# Patient Record
Sex: Female | Born: 1980 | Race: White | Hispanic: Yes | Smoking: Never smoker
Health system: Southern US, Community
[De-identification: ages and names within clinical notes are randomized; demographics above are authoritative.]

## PROBLEM LIST (undated history)

## (undated) DIAGNOSIS — Z789 Other specified health status: Secondary | ICD-10-CM

## (undated) DIAGNOSIS — O24419 Gestational diabetes mellitus in pregnancy, unspecified control: Secondary | ICD-10-CM

## (undated) HISTORY — DX: Gestational diabetes mellitus in pregnancy, unspecified control: O24.419

## (undated) HISTORY — PX: APPENDECTOMY: SHX54

---

## 2006-10-17 ENCOUNTER — Ambulatory Visit (HOSPITAL_COMMUNITY): Admission: RE | Admit: 2006-10-17 | Discharge: 2006-10-17 | Payer: Self-pay | Admitting: Family Medicine

## 2007-01-30 ENCOUNTER — Inpatient Hospital Stay (HOSPITAL_COMMUNITY): Admission: AD | Admit: 2007-01-30 | Discharge: 2007-01-31 | Payer: Self-pay | Admitting: Family Medicine

## 2007-01-30 ENCOUNTER — Ambulatory Visit: Payer: Self-pay | Admitting: Obstetrics and Gynecology

## 2009-02-08 ENCOUNTER — Emergency Department (HOSPITAL_COMMUNITY): Admission: EM | Admit: 2009-02-08 | Discharge: 2009-02-08 | Payer: Self-pay | Admitting: Emergency Medicine

## 2010-07-12 LAB — URINALYSIS, ROUTINE W REFLEX MICROSCOPIC
Bilirubin Urine: NEGATIVE
Glucose, UA: NEGATIVE mg/dL
Ketones, ur: NEGATIVE mg/dL
Protein, ur: NEGATIVE mg/dL
Specific Gravity, Urine: 1.023 (ref 1.005–1.030)
Urobilinogen, UA: 1 mg/dL (ref 0.0–1.0)

## 2010-07-12 LAB — WET PREP, GENITAL: Yeast Wet Prep HPF POC: NONE SEEN

## 2010-07-12 LAB — URINE MICROSCOPIC-ADD ON

## 2011-01-17 LAB — CBC
HCT: 35.2 — ABNORMAL LOW
MCHC: 33.8
MCV: 84.3
RBC: 4.17
WBC: 10.5

## 2012-04-04 ENCOUNTER — Observation Stay (HOSPITAL_COMMUNITY)
Admission: EM | Admit: 2012-04-04 | Discharge: 2012-04-06 | Disposition: A | Discharge status: Home / Self Care | Payer: MEDICAID | Attending: Surgery | Admitting: Surgery

## 2012-04-04 ENCOUNTER — Encounter (HOSPITAL_COMMUNITY): Payer: Self-pay

## 2012-04-04 ENCOUNTER — Emergency Department (HOSPITAL_COMMUNITY): Payer: Self-pay

## 2012-04-04 DIAGNOSIS — K3580 Unspecified acute appendicitis: Principal | ICD-10-CM | POA: Insufficient documentation

## 2012-04-04 LAB — CBC WITH DIFFERENTIAL/PLATELET
Basophils Relative: 0 % (ref 0–1)
Eosinophils Absolute: 0 10*3/uL (ref 0.0–0.7)
HCT: 40.4 % (ref 36.0–46.0)
Hemoglobin: 14.1 g/dL (ref 12.0–15.0)
Lymphs Abs: 1.2 10*3/uL (ref 0.7–4.0)
MCH: 31.5 pg (ref 26.0–34.0)
MCHC: 34.9 g/dL (ref 30.0–36.0)
MCV: 90.2 fL (ref 78.0–100.0)
Monocytes Absolute: 1.4 10*3/uL — ABNORMAL HIGH (ref 0.1–1.0)
Neutro Abs: 15.2 10*3/uL — ABNORMAL HIGH (ref 1.7–7.7)
RDW: 12.8 % (ref 11.5–15.5)

## 2012-04-04 LAB — COMPREHENSIVE METABOLIC PANEL
ALT: 25 U/L (ref 0–35)
BUN: 8 mg/dL (ref 6–23)
CO2: 25 mEq/L (ref 19–32)
Calcium: 9.5 mg/dL (ref 8.4–10.5)
GFR calc Af Amer: >90 mL/min (ref >90)
GFR calc non Af Amer: >90 mL/min (ref >90)
Glucose, Bld: 101 mg/dL — ABNORMAL HIGH (ref 70–99)
Sodium: 138 mEq/L (ref 135–145)

## 2012-04-04 LAB — URINALYSIS, ROUTINE W REFLEX MICROSCOPIC
Bilirubin Urine: NEGATIVE
Ketones, ur: NEGATIVE mg/dL
Nitrite: NEGATIVE
Specific Gravity, Urine: 1.021 (ref 1.005–1.030)
Urobilinogen, UA: 1 mg/dL (ref 0.0–1.0)

## 2012-04-04 LAB — WET PREP, GENITAL: Trich, Wet Prep: NONE SEEN

## 2012-04-04 LAB — URINE MICROSCOPIC-ADD ON

## 2012-04-04 MED ORDER — ONDANSETRON HCL 4 MG/2ML IJ SOLN
4.0000 mg | Freq: Once | INTRAMUSCULAR | Status: AC
  Administered 2012-04-04: 4 mg via INTRAVENOUS
  Filled 2012-04-04: qty 2

## 2012-04-04 MED ORDER — SODIUM CHLORIDE 0.9 % IV SOLN
1.0000 g | Freq: Once | INTRAVENOUS | Status: AC
  Administered 2012-04-04 – 2012-04-05 (×2): 1 g via INTRAVENOUS
  Filled 2012-04-04: qty 1

## 2012-04-04 MED ORDER — IOHEXOL 300 MG/ML  SOLN
80.0000 mL | Freq: Once | INTRAMUSCULAR | Status: AC | PRN
  Administered 2012-04-04: 80 mL via INTRAVENOUS

## 2012-04-04 MED ORDER — POTASSIUM CHLORIDE 10 MEQ/100ML IV SOLN
10.0000 meq | Freq: Once | INTRAVENOUS | Status: AC
  Administered 2012-04-04: 10 meq via INTRAVENOUS
  Filled 2012-04-04: qty 100

## 2012-04-04 MED ORDER — BUPIVACAINE HCL (PF) 0.25 % IJ SOLN
INTRAMUSCULAR | Status: AC
  Filled 2012-04-04: qty 30

## 2012-04-04 MED ORDER — ACETAMINOPHEN 10 MG/ML IV SOLN
INTRAVENOUS | Status: AC
  Filled 2012-04-04: qty 100

## 2012-04-04 MED ORDER — LIDOCAINE-EPINEPHRINE (PF) 1 %-1:200000 IJ SOLN
INTRAMUSCULAR | Status: AC
  Filled 2012-04-04: qty 10

## 2012-04-04 MED ORDER — LACTATED RINGERS IV SOLN
INTRAVENOUS | Status: DC | PRN
  Administered 2012-04-04 – 2012-04-05 (×2): via INTRAVENOUS

## 2012-04-04 MED ORDER — MORPHINE SULFATE 4 MG/ML IJ SOLN
4.0000 mg | Freq: Once | INTRAMUSCULAR | Status: AC
  Administered 2012-04-04: 4 mg via INTRAVENOUS
  Filled 2012-04-04: qty 1

## 2012-04-04 MED ORDER — SODIUM CHLORIDE 0.9 % IV BOLUS (SEPSIS)
1000.0000 mL | Freq: Once | INTRAVENOUS | Status: AC
  Administered 2012-04-04: 1000 mL via INTRAVENOUS

## 2012-04-04 NOTE — ED Provider Notes (Signed)
History     CSN: [id number]  Arrival date & time 04/04/12  1717   First MD Initiated Contact with Patient 04/04/12 2015      Chief Complaint  Patient presents with  . Abdominal Pain  . Emesis    (Consider location/radiation/quality/duration/timing/severity/associated sxs/prior treatment) HPI Comments: Patient reports that she has had RLQ abdominal pain since yesterday.  The pain has been constant.  She has never had pain like this before.  She has not taken anything for her pain.  She reports that she has also had six episodes of vomiting.  No diarrhea.  No constipation.  No blood in her emesis or her stool.  Last BM was yesterday.   She denies fever.  She denies vaginal discharge.  She is currently on her menstrual cycle.  No prior abdominal surgeries.    Patient is a 31 y.o. female presenting with abdominal pain. The history is provided by the patient. The history is limited by a language barrier. 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  Abdominal Pain The primary symptoms of the illness include abdominal pain, nausea and vomiting. The primary symptoms of the illness do not include fever, diarrhea, dysuria or vaginal discharge. The problem has been gradually worsening.  The patient states that she believes she is currently not pregnant. The patient has not had a change in bowel habit. Symptoms associated with the illness do not include chills, constipation, urgency, hematuria or frequency. Significant associated medical issues do not include inflammatory bowel disease.    History reviewed. No pertinent past medical history.  History reviewed. No pertinent past surgical history.  History reviewed. No pertinent family history.  History  Substance Use Topics  . Smoking status: Never Smoker   . Smokeless tobacco: Not on file  . Alcohol Use: No    OB History    Grav Para Term Preterm Abortions TAB SAB Ect Mult Living                  Review of Systems    Constitutional: Positive for appetite change. Negative for fever and chills.  Gastrointestinal: Positive for nausea, vomiting and abdominal pain. Negative for diarrhea, constipation, blood in stool and abdominal distention.  Genitourinary: Negative for dysuria, urgency, frequency, hematuria, decreased urine volume, vaginal discharge, difficulty urinating, menstrual problem and pelvic pain.  All other systems reviewed and are negative.    Allergies  Review of patient's allergies indicates no known allergies.  Home Medications   Current Outpatient Rx  Name  Route  Sig  Dispense  Refill  . ACETAMINOPHEN 325 MG PO TABS   Oral   Take 650 mg by mouth every 6 (six) hours as needed. Pain           BP 106/63  Pulse 114  Temp 98.2 F (36.8 C) (Oral)  Resp 18  SpO2 100%  LMP 03/31/2012  Physical Exam  Nursing note and vitals reviewed. Constitutional: She appears well-developed and well-nourished. No distress.  HENT:  Head: Normocephalic and atraumatic.  Mouth/Throat: Oropharynx is clear and moist.  Neck: Normal range of motion. Neck supple.  Cardiovascular: Normal rate, regular rhythm and normal heart sounds.   Pulmonary/Chest: Effort normal and breath sounds normal.  Abdominal: Soft. Normal appearance and bowel sounds are normal. There is tenderness in the right lower quadrant. There is guarding and tenderness at McBurney's point. There is no rigidity, no rebound and negative Murphy's sign.  Genitourinary: Vagina normal. Cervix exhibits no motion tenderness and no discharge. Right  adnexum displays no mass, no tenderness and no fullness. Left adnexum displays no mass, no tenderness and no fullness.  Neurological: She is alert.  Skin: Skin is warm and dry. She is not diaphoretic.  Psychiatric: She has a normal mood and affect.    ED Course  Procedures (including critical care time)  Labs Reviewed  CBC WITH DIFFERENTIAL - Abnormal; Notable for the following:    WBC 17.8 (*)      Neutrophils Relative 85 (*)     Lymphocytes Relative 7 (*)     Neutro Abs 15.2 (*)     Monocytes Absolute 1.4 (*)     All other components within normal limits  COMPREHENSIVE METABOLIC PANEL - Abnormal; Notable for the following:    Potassium 3.1 (*)     Glucose, Bld 101 (*)     Creatinine, Ser 0.47 (*)     All other components within normal limits  URINALYSIS, ROUTINE W REFLEX MICROSCOPIC - Abnormal; Notable for the following:    APPearance TURBID (*)     Hgb urine dipstick LARGE (*)     Leukocytes, UA SMALL (*)     All other components within normal limits  URINE MICROSCOPIC-ADD ON - Abnormal; Notable for the following:    Squamous Epithelial / LPF FEW (*)     All other components within normal limits  POCT PREGNANCY, URINE  GC/CHLAMYDIA PROBE AMP  WET PREP, GENITAL   Ct Abdomen Pelvis W Contrast  04/04/2012  *RADIOLOGY REPORT*  Clinical Data: Right lower quadrant pain and emesis  CT ABDOMEN AND PELVIS WITH CONTRAST  Technique:  Multidetector CT imaging of the abdomen and pelvis was performed following the standard protocol during bolus administration of intravenous contrast.  Contrast: 80mL OMNIPAQUE IOHEXOL 300 MG/ML  SOLN  Comparison: None.  Findings: Lung bases are clear.  No pericardial or pleural effusion.  There is no focal liver abnormality.  Large gallstone is noted within the lumen of the gallbladder measuring 1.9 cm.  No biliary dilatation identified.  The pancreas appears normal.  Normal appearance of the spleen.  The adrenal glands are unremarkable.  The normal appearance of both kidneys.  Urinary bladder appears normal.  Uterus and adnexal structures are unremarkable.  The stomach is normal.  The small bowel loops are unremarkable. The appendix is thickened with mucosal enhancement and periappendiceal fat stranding consistent with acute appendicitis. The appendix measures 11 mm in thickness.  Free fluid and fat stranding is noted within the right abdomen, image 41.  The  colon is unremarkable.  No evidence for bowel perforation or abscess formation.  Review of the visualized osseous structures is unremarkable.  IMPRESSION:  1. Examination is positive for acute appendicitis.  No evidence for abscess formation. 2.  Gallstone.   Original Report Authenticated By: Signa Kell, M.D.      No diagnosis found.  Patient with Acute Appendicitis.  Discussed results and plan with patient using interpretor phone.  MDM  Patient presenting with RLQ abdominal pain, nausea,and vomiting.  CT shows an Acute Appendicitis. Surgery consulted and admitted patient for surgery.        Pascal Lux [place], PA-C 04/06/12 [id number]

## 2012-04-04 NOTE — ED Notes (Signed)
RLQ pain, vomiting and chills x 2 days.  Pt is Spanish speaking, only speaks limited [place].

## 2012-04-04 NOTE — ED Notes (Signed)
Pt taken to OR.

## 2012-04-04 NOTE — ED Notes (Signed)
Surgeon at bedside.  

## 2012-04-04 NOTE — H&P (Signed)
Reason for Consult:appendicitis  Referring Physician: [name] is an 31 y.o. female.  HPI: this patient presents to the emergency room for evaluation of abdominal pain. She says this started at about 4:00 yesterday afternoon and has been consistent. She says it was acute onset of abdominal pain and she had an episode of vomiting as well.  She has not had any fevers or chills. She says that her bowels have been normal and has had not had any urinary symptoms. She had a CT scan which was concerning for acute appendicitis.  History reviewed. No pertinent past medical history.  History reviewed. No pertinent past surgical history.  History reviewed. No pertinent family history.  Social History:  reports that she has never smoked. She does not have any smokeless tobacco history on file. She reports that she does not drink alcohol or use illicit drugs.  Allergies: No Known Allergies  Medications: I have reviewed the patient's current medications.  Results for orders placed during the hospital encounter of 04/04/12 (from the past 48 hour(s))  CBC WITH DIFFERENTIAL     Status: Abnormal   Collection Time   04/04/12  6:32 PM      Component Value Range Comment   WBC 17.8 (*) 4.0 - 10.5 K/uL    RBC 4.48  3.87 - 5.11 MIL/uL    Hemoglobin 14.1  12.0 - 15.0 g/dL    HCT 45.4  09.8 - 11.9 %    MCV 90.2  78.0 - 100.0 fL    MCH 31.5  26.0 - 34.0 pg    MCHC 34.9  30.0 - 36.0 g/dL    RDW 14.7  82.9 - 56.2 %    Platelets 326  150 - 400 K/uL    Neutrophils Relative 85 (*) 43 - 77 %    Lymphocytes Relative 7 (*) 12 - 46 %    Monocytes Relative 8  3 - 12 %    Eosinophils Relative 0  0 - 5 %    Basophils Relative 0  0 - 1 %    Neutro Abs 15.2 (*) 1.7 - 7.7 K/uL    Lymphs Abs 1.2  0.7 - 4.0 K/uL    Monocytes Absolute 1.4 (*) 0.1 - 1.0 K/uL    Eosinophils Absolute 0.0  0.0 - 0.7 K/uL    Basophils Absolute 0.0  0.0 - 0.1 K/uL    WBC Morphology MILD LEFT SHIFT (1-5% METAS, OCC MYELO, OCC  BANDS)      Smear Review PLATELET COUNT CONFIRMED BY SMEAR     COMPREHENSIVE METABOLIC PANEL     Status: Abnormal   Collection Time   04/04/12  6:32 PM      Component Value Range Comment   Sodium 138  135 - 145 mEq/L    Potassium 3.1 (*) 3.5 - 5.1 mEq/L    Chloride 104  96 - 112 mEq/L    CO2 25  19 - 32 mEq/L    Glucose, Bld 101 (*) 70 - 99 mg/dL    BUN 8  6 - 23 mg/dL    Creatinine, Ser 1.30 (*) 0.50 - 1.10 mg/dL    Calcium 9.5  8.4 - 86.5 mg/dL    Total Protein 7.9  6.0 - 8.3 g/dL    Albumin 3.8  3.5 - 5.2 g/dL    AST 20  0 - 37 U/L    ALT 25  0 - 35 U/L    Alkaline Phosphatase 91  39 - 117  U/L    Total Bilirubin 0.5  0.3 - 1.2 mg/dL    GFR calc non Af Amer >90  >90 mL/min    GFR calc Af Amer >90  >90 mL/min   URINALYSIS, ROUTINE W REFLEX MICROSCOPIC     Status: Abnormal   Collection Time   04/04/12  6:42 PM      Component Value Range Comment   Color, Urine YELLOW  YELLOW    APPearance TURBID (*) CLEAR    Specific Gravity, Urine 1.021  1.005 - 1.030    pH 8.0  5.0 - 8.0    Glucose, UA NEGATIVE  NEGATIVE mg/dL    Hgb urine dipstick LARGE (*) NEGATIVE    Bilirubin Urine NEGATIVE  NEGATIVE    Ketones, ur NEGATIVE  NEGATIVE mg/dL    Protein, ur NEGATIVE  NEGATIVE mg/dL    Urobilinogen, UA 1.0  0.0 - 1.0 mg/dL    Nitrite NEGATIVE  NEGATIVE    Leukocytes, UA SMALL (*) NEGATIVE   URINE MICROSCOPIC-ADD ON     Status: Abnormal   Collection Time   04/04/12  6:42 PM      Component Value Range Comment   Squamous Epithelial / LPF FEW (*) RARE    WBC, UA 0-2  <3 WBC/hpf    RBC / HPF 3-6  <3 RBC/hpf    Urine-Other AMORPHOUS URATES/PHOSPHATES     POCT PREGNANCY, URINE     Status: Normal   Collection Time   04/04/12  6:45 PM      Component Value Range Comment   Preg Test, Ur NEGATIVE  NEGATIVE   WET PREP, GENITAL     Status: Abnormal   Collection Time   04/04/12  9:09 PM      Component Value Range Comment   Yeast Wet Prep HPF POC NONE SEEN  NONE SEEN    Trich, Wet Prep NONE  SEEN  NONE SEEN    Clue Cells Wet Prep HPF POC FEW (*) NONE SEEN    WBC, Wet Prep HPF POC MANY (*) NONE SEEN     Ct Abdomen Pelvis W Contrast  04/04/2012  *RADIOLOGY REPORT*  Clinical Data: Right lower quadrant pain and emesis  CT ABDOMEN AND PELVIS WITH CONTRAST  Technique:  Multidetector CT imaging of the abdomen and pelvis was performed following the standard protocol during bolus administration of intravenous contrast.  Contrast: 80mL OMNIPAQUE IOHEXOL 300 MG/ML  SOLN  Comparison: None.  Findings: Lung bases are clear.  No pericardial or pleural effusion.  There is no focal liver abnormality.  Large gallstone is noted within the lumen of the gallbladder measuring 1.9 cm.  No biliary dilatation identified.  The pancreas appears normal.  Normal appearance of the spleen.  The adrenal glands are unremarkable.  The normal appearance of both kidneys.  Urinary bladder appears normal.  Uterus and adnexal structures are unremarkable.  The stomach is normal.  The small bowel loops are unremarkable. The appendix is thickened with mucosal enhancement and periappendiceal fat stranding consistent with acute appendicitis. The appendix measures 11 mm in thickness.  Free fluid and fat stranding is noted within the right abdomen, image 41.  The colon is unremarkable.  No evidence for bowel perforation or abscess formation.  Review of the visualized osseous structures is unremarkable.  IMPRESSION:  1. Examination is positive for acute appendicitis.  No evidence for abscess formation. 2.  Gallstone.   Original Report Authenticated By: Signa Kell, M.D.     All other review of  systems negative or noncontributory except as stated in the HPI  Blood pressure 106/63, pulse 114, temperature 98.2 F (36.8 C), temperature source Oral, resp. rate 18, last menstrual period 03/31/2012, SpO2 100.00%. General appearance: alert, cooperative and no distress Head: Normocephalic, without obvious abnormality, atraumatic Resp:  nonlabored Cardio: tachy, regular GI: soft, focal RLQ pain, ND, no rigidity or guarding Extremities: extremities normal, atraumatic, no cyanosis or edema Neurologic: Grossly normal  Assessment/Plan: Abdominal pain likely acute appendicitis She has a history and physical exam and CT scan concerning for acute appendicitis. She also has a leukocytosis. I discussed with her through the Spanish interpreter the necessity for surgery as well as the pros and cons of the procedure. We discussed the risks of infection, bleeding, bowel injury and injury to the ureter or surrounding structures and the risk of negative laparoscopy and the need for an open procedure or an alternative procedure. She expressed understanding of her desire to proceed with planned procedure. All questions were answered. We will plan for IV antibiotics and fluid resuscitation and we'll proceed with a diagnostic laparoscopy and appendectomy as soon as available.  Lodema Pilot DAVID 04/04/2012, 11:27 PM

## 2012-04-05 ENCOUNTER — Encounter (HOSPITAL_COMMUNITY): Payer: Self-pay | Admitting: *Deleted

## 2012-04-05 ENCOUNTER — Encounter (HOSPITAL_COMMUNITY)
Admission: EM | Disposition: A | Discharge status: Home / Self Care | Payer: Self-pay | Source: Home / Self Care | Attending: Emergency Medicine

## 2012-04-05 ENCOUNTER — Emergency Department (HOSPITAL_COMMUNITY): Payer: Self-pay | Admitting: *Deleted

## 2012-04-05 DIAGNOSIS — K56609 Unspecified intestinal obstruction, unspecified as to partial versus complete obstruction: Secondary | ICD-10-CM

## 2012-04-05 HISTORY — PX: LAPAROSCOPIC APPENDECTOMY: SHX408

## 2012-04-05 LAB — GC/CHLAMYDIA PROBE AMP
CT Probe RNA: NEGATIVE
GC Probe RNA: NEGATIVE

## 2012-04-05 LAB — CBC
MCH: 30.4 pg (ref 26.0–34.0)
MCHC: 33.8 g/dL (ref 30.0–36.0)
RDW: 12.6 % (ref 11.5–15.5)

## 2012-04-05 SURGERY — APPENDECTOMY, LAPAROSCOPIC
Anesthesia: General | Site: Abdomen | Wound class: Contaminated

## 2012-04-05 MED ORDER — GLYCOPYRROLATE 0.2 MG/ML IJ SOLN
INTRAMUSCULAR | Status: DC | PRN
  Administered 2012-04-05: 0.2 mg via INTRAVENOUS

## 2012-04-05 MED ORDER — HYDROMORPHONE HCL PF 1 MG/ML IJ SOLN: 0.2500 mg | INTRAMUSCULAR | Status: DC | PRN

## 2012-04-05 MED ORDER — ACETAMINOPHEN 10 MG/ML IV SOLN
INTRAVENOUS | Status: DC | PRN
  Administered 2012-04-05: 1000 mg via INTRAVENOUS

## 2012-04-05 MED ORDER — NEOSTIGMINE METHYLSULFATE 1 MG/ML IJ SOLN
INTRAMUSCULAR | Status: DC | PRN
  Administered 2012-04-05: 1 mg via INTRAVENOUS

## 2012-04-05 MED ORDER — LACTATED RINGERS IR SOLN
Status: DC | PRN
  Administered 2012-04-05: 100 mL

## 2012-04-05 MED ORDER — MORPHINE SULFATE 2 MG/ML IJ SOLN
2.0000 mg | INTRAMUSCULAR | Status: DC | PRN
  Administered 2012-04-05: 2 mg via INTRAVENOUS
  Filled 2012-04-05: qty 1

## 2012-04-05 MED ORDER — ONDANSETRON HCL 4 MG/2ML IJ SOLN
INTRAMUSCULAR | Status: DC | PRN
  Administered 2012-04-05 (×2): 2 mg via INTRAVENOUS

## 2012-04-05 MED ORDER — LIDOCAINE HCL (CARDIAC) 20 MG/ML IV SOLN
INTRAVENOUS | Status: DC | PRN
  Administered 2012-04-05: 50 mg via INTRAVENOUS

## 2012-04-05 MED ORDER — ONDANSETRON HCL 4 MG PO TABS: 4.0000 mg | ORAL_TABLET | Freq: Four times a day (QID) | ORAL | Status: DC | PRN

## 2012-04-05 MED ORDER — ONDANSETRON HCL 4 MG/2ML IJ SOLN
4.0000 mg | Freq: Four times a day (QID) | INTRAMUSCULAR | Status: DC | PRN
  Administered 2012-04-05: 4 mg via INTRAVENOUS
  Filled 2012-04-05: qty 2

## 2012-04-05 MED ORDER — FENTANYL CITRATE 0.05 MG/ML IJ SOLN
INTRAMUSCULAR | Status: DC | PRN
  Administered 2012-04-05 (×2): 50 ug via INTRAVENOUS

## 2012-04-05 MED ORDER — ENOXAPARIN SODIUM 40 MG/0.4ML ~~LOC~~ SOLN
40.0000 mg | SUBCUTANEOUS | Status: DC
  Administered 2012-04-05: 40 mg via SUBCUTANEOUS
  Filled 2012-04-05 (×2): qty 0.4

## 2012-04-05 MED ORDER — BUPIVACAINE HCL (PF) 0.25 % IJ SOLN
INTRAMUSCULAR | Status: DC | PRN
  Administered 2012-04-05: 10 mL

## 2012-04-05 MED ORDER — LIDOCAINE-EPINEPHRINE (PF) 1 %-1:200000 IJ SOLN
INTRAMUSCULAR | Status: DC | PRN
  Administered 2012-04-05: 10 mL

## 2012-04-05 MED ORDER — DEXAMETHASONE SODIUM PHOSPHATE 10 MG/ML IJ SOLN
INTRAMUSCULAR | Status: DC | PRN
  Administered 2012-04-05: 10 mg via INTRAVENOUS

## 2012-04-05 MED ORDER — SUCCINYLCHOLINE CHLORIDE 20 MG/ML IJ SOLN
INTRAMUSCULAR | Status: DC | PRN
  Administered 2012-04-05: 100 mg via INTRAVENOUS

## 2012-04-05 MED ORDER — KCL IN DEXTROSE-NACL 20-5-0.45 MEQ/L-%-% IV SOLN
INTRAVENOUS | Status: DC
  Administered 2012-04-05 – 2012-04-06 (×3): via INTRAVENOUS
  Filled 2012-04-05 (×4): qty 1000

## 2012-04-05 MED ORDER — PROPOFOL 10 MG/ML IV EMUL
INTRAVENOUS | Status: DC | PRN
  Administered 2012-04-05: 180 mg via INTRAVENOUS

## 2012-04-05 MED ORDER — CISATRACURIUM BESYLATE (PF) 10 MG/5ML IV SOLN
INTRAVENOUS | Status: DC | PRN
  Administered 2012-04-05: 4 mg via INTRAVENOUS

## 2012-04-05 MED ORDER — PROMETHAZINE HCL 25 MG/ML IJ SOLN: 6.2500 mg | INTRAMUSCULAR | Status: DC | PRN

## 2012-04-05 MED ORDER — HYDROCODONE-ACETAMINOPHEN 5-325 MG PO TABS
1.0000 | ORAL_TABLET | ORAL | Status: DC | PRN
  Administered 2012-04-05: 1 via ORAL
  Filled 2012-04-05: qty 1

## 2012-04-05 MED ORDER — INFLUENZA VIRUS VACC SPLIT PF IM SUSP
0.5000 mL | INTRAMUSCULAR | Status: DC
  Filled 2012-04-05: qty 0.5

## 2012-04-05 MED ORDER — MIDAZOLAM HCL 5 MG/5ML IJ SOLN
INTRAMUSCULAR | Status: DC | PRN
  Administered 2012-04-05: 1 mg via INTRAVENOUS

## 2012-04-05 SURGICAL SUPPLY — 45 items
APPLIER CLIP ROT 10 11.4 M/L (STAPLE)
CANISTER SUCTION 2500CC (MISCELLANEOUS) IMPLANT
CHLORAPREP W/TINT 26ML (MISCELLANEOUS) ×2 IMPLANT
CLIP APPLIE ROT 10 11.4 M/L (STAPLE) IMPLANT
CLOTH BEACON ORANGE TIMEOUT ST (SAFETY) ×2 IMPLANT
COVER SURGICAL LIGHT HANDLE (MISCELLANEOUS) IMPLANT
CUTTER FLEX LINEAR 45M (STAPLE) IMPLANT
DECANTER SPIKE VIAL GLASS SM (MISCELLANEOUS) ×6 IMPLANT
DERMABOND ADVANCED (GAUZE/BANDAGES/DRESSINGS) ×1
DERMABOND ADVANCED .7 DNX12 (GAUZE/BANDAGES/DRESSINGS) ×1 IMPLANT
ELECT COATED BLADE 2.86 ST (ELECTRODE) ×2 IMPLANT
ELECT REM PT RETURN 9FT ADLT (ELECTROSURGICAL) ×2
ELECTRODE REM PT RTRN 9FT ADLT (ELECTROSURGICAL) ×1 IMPLANT
GLOVE BIOGEL PI IND STRL 8 (GLOVE) ×1 IMPLANT
GLOVE BIOGEL PI INDICATOR 8 (GLOVE) ×1
GLOVE SURG SS PI 7.5 STRL IVOR (GLOVE) ×4 IMPLANT
GOWN STRL NON-REIN LRG LVL3 (GOWN DISPOSABLE) ×4 IMPLANT
GOWN STRL REIN XL XLG (GOWN DISPOSABLE) ×2 IMPLANT
HANDLE STAPLE EGIA 4 XL (STAPLE) ×2 IMPLANT
KIT BASIN OR (CUSTOM PROCEDURE TRAY) ×2 IMPLANT
KIT ROOM TURNOVER OR (KITS) ×2 IMPLANT
NS IRRIG 1000ML POUR BTL (IV SOLUTION) IMPLANT
PAD ARMBOARD 7.5X6 YLW CONV (MISCELLANEOUS) ×2 IMPLANT
PENCIL BUTTON HOLSTER BLD 10FT (ELECTRODE) ×2 IMPLANT
POUCH SPECIMEN RETRIEVAL 10MM (ENDOMECHANICALS) ×2 IMPLANT
RELOAD 45 VASCULAR/THIN (ENDOMECHANICALS) IMPLANT
RELOAD EGIA 45 MED/THCK PURPLE (STAPLE) ×2 IMPLANT
RELOAD STAPLE TA45 3.5 REG BLU (ENDOMECHANICALS) IMPLANT
SCALPEL HARMONIC ACE (MISCELLANEOUS) ×2 IMPLANT
SCISSORS LAP 5X35 DISP (ENDOMECHANICALS) IMPLANT
SET IRRIG TUBING LAPAROSCOPIC (IRRIGATION / IRRIGATOR) ×2 IMPLANT
SLEEVE ENDOPATH XCEL 5M (ENDOMECHANICALS) ×2 IMPLANT
SOLUTION ANTI FOG 6CC (MISCELLANEOUS) ×2 IMPLANT
SPECIMEN JAR SMALL (MISCELLANEOUS) IMPLANT
SUT MNCRL AB 4-0 PS2 18 (SUTURE) ×2 IMPLANT
SUT VICRYL 0 UR6 27IN ABS (SUTURE) ×2 IMPLANT
TOWEL OR 17X24 6PK STRL BLUE (TOWEL DISPOSABLE) ×2 IMPLANT
TOWEL OR 17X26 10 PK STRL BLUE (TOWEL DISPOSABLE) ×2 IMPLANT
TRAY FOLEY CATH 14FR (SET/KITS/TRAYS/PACK) ×2 IMPLANT
TRAY LAP CHOLE (CUSTOM PROCEDURE TRAY) ×2 IMPLANT
TROCAR BALLN 12MMX100 BLUNT (TROCAR) ×2 IMPLANT
TROCAR BLADELESS OPT 5 75 (ENDOMECHANICALS) ×2 IMPLANT
TROCAR XCEL BLUNT TIP 100MML (ENDOMECHANICALS) IMPLANT
TROCAR XCEL NON-BLD 5MMX100MML (ENDOMECHANICALS) IMPLANT
WATER STERILE IRR 1000ML POUR (IV SOLUTION) IMPLANT

## 2012-04-05 NOTE — Anesthesia Preprocedure Evaluation (Addendum)
Anesthesia Evaluation  Patient identified by MRN, date of birth, ID band Patient awake    Reviewed: Allergy & Precautions, H&P , NPO status , Patient's Chart, lab work & pertinent test results, reviewed documented beta blocker date and time   Airway Mallampati: II TM Distance: >3 FB Neck ROM: full    Dental No notable dental hx.    Pulmonary neg pulmonary ROS,  breath sounds clear to auscultation  Pulmonary exam normal       Cardiovascular Exercise Tolerance: Good negative cardio ROS  Rhythm:regular Rate:Normal     Neuro/Psych negative neurological ROS  negative psych ROS   GI/Hepatic negative GI ROS, Neg liver ROS,   Endo/Other  negative endocrine ROS  Renal/GU negative Renal ROS  negative genitourinary   Musculoskeletal   Abdominal   Peds  Hematology negative hematology ROS (+)   Anesthesia Other Findings   Reproductive/Obstetrics negative OB ROS                           Anesthesia Physical Anesthesia Plan  ASA: I and emergent  Anesthesia Plan: General ETT   Post-op Pain Management:    Induction:   Airway Management Planned: Oral ETT  Additional Equipment:   Intra-op Plan:   Post-operative Plan:   Informed Consent: I have reviewed the patients History and Physical, chart, labs and discussed the procedure including the risks, benefits and alternatives for the proposed anesthesia with the patient or authorized representative who has indicated his/her understanding and acceptance.   Dental Advisory Given  Plan Discussed with: CRNA  Anesthesia Plan Comments: (Discussed r/b general anesthesia through interpreter (on the phone). Questions answered. Consent obtained.)       Anesthesia Quick Evaluation

## 2012-04-05 NOTE — Preoperative (Signed)
Beta Blockers   Reason not to administer Beta Blockers:Not Applicable 

## 2012-04-05 NOTE — Op Note (Signed)
[name], [name] NO.:  [id number]  MEDICAL RECORD NO.:  [id number]  LOCATION:  1609                         FACILITY:  [facility]  PHYSICIAN:  Lodema Pilot, MD       DATE OF BIRTH:  May 07, 1980  DATE OF PROCEDURE:  04/05/2012 DATE OF DISCHARGE:                              OPERATIVE REPORT   PROCEDURE:  Diagnostic laparoscopy with laparoscopic appendectomy.  PREOPERATIVE DIAGNOSIS:  Acute appendicitis.  POSTOPERATIVE DIAGNOSIS:  Acute appendicitis.  SURGEON:  Lodema Pilot, MD  ASSISTANT:  None.  ANESTHESIA:  General endotracheal tube anesthesia with 20 mL of 1% lidocaine with epinephrine and 0.25% Marcaine in a 50:50 mixture.  FLUIDS:  1500 mL of crystalloid.  ESTIMATED BLOOD LOSS:  Minimal.  DRAINS:  None.  SPECIMENS:  Appendix sent to Pathology for permanent section.  COMPLICATIONS:  None apparent.  INDICATION FOR PROCEDURE:  [name] is a 31 year old female with a 1-day history of increasing right-sided abdominal pain.  She had a CT scan concerning for acute appendicitis as well as leukocytosis and physical exam concerning for appendicitis.  FINDINGS:  Acute appendicitis without any evidence of perforation, retrocecal appendix, very redundant sigmoid colon.  No other obvious inflammatory changes or abnormalities identified.  OPERATIVE DETAILS:  [name] was seen and evaluated in the emergency room and risks and benefits of procedure were discussed in lay terms through the Spanish translator, and informed consent was obtained.  She was given therapeutic antibiotics and taken to the operating room, placed on table in supine position.  General endotracheal tube anesthesia was obtained.  Foley catheter was placed and her left arm was tucked and her abdomen was prepped and draped in a standard surgical fashion.  Procedure time-out was performed with all operative team members confirming proper patient, procedure.  Then a supraumbilical midline  incision was made in the skin and dissection carried down through the subcutaneous tissue using blunt dissection.  The abdominal wall fascia was elevated and sharply incised.  Peritoneum was entered bluntly, and a 12-mm balloon port was placed at the umbilicus and pneumoperitoneum was obtained.  Camera was introduced and there was no evidence of bowel injury upon entry.  Two 5-mm trocars were placed in the abdomen under direct visualization, one in the left lower quadrant and one in the right upper quadrant pain, and the abdomen was explored. She had a very long and redundant sigmoid colon, which was redundant over to the right lower quadrant, but this was otherwise normal.  I felt the right colon proximal to the cecum and terminal ileum and the base of the appendix was identified.  This was then followed distally and the appendix was retrocecal in nature diving posteriorly.  This was adhered to the psoas muscle and retroperitoneum.  I elevated the appendix and using sharp dissection freed this up from the retroperitoneum and the redundant sigmoid colon, which is adhered to the area as well.  No cautery was used for this mobilization.  Then a window was created through the mesoappendix near the base and the appendix was divided with Endo-GIA purple Tri Staple load.  Then, the using the Harmonic scalpel, I divided the mesoappendix right  up along the edge of the appendix and followed this posteriorly until I was able to elevate the tip of the appendix.  The tip of the appendix was dilated and inflamed consistent with acute appendicitis.  There was no obvious abscess or perforation. The appendix was completely removed and placed in an EndoCatch bag and removed from the umbilical trocar site.  The abdomen was explored. There was no inflammatory changes on the gallbladder.  I ran the small intestine from the ileocecal valve proximal and there was no evidence of Meckel's diverticulum.  The area  of the appendix was again inspected and noted to be hemostatic.  The umbilical fascia was then approximated with interrupted 0-Vicryl sutures in an open fashion.  The sutures were secured and the abdomen was re-insufflated.  The abdominal wall closure was noted to be adequate without any evidence of bowel injury.  The abdomen was noted to be hemostatic without any evidence of bowel injury. The left lower quadrant trocar was removed and the gas was removed and the final trocar was removed.  The wounds were injected with a total of 20 mL of 1% lidocaine with epinephrine and 0.25% Marcaine in a 50:50 mixture.  The skin edges were approximated with 4-0 Monocryl subcuticular suture.  Skin was washed and dried and Dermabond was applied.  All sponge, needle, and instrument counts were correct at the end of the case.  The patient tolerated the procedure well without apparent complications.          ______________________________ Lodema Pilot, MD     BL/MEDQ  D:  04/05/2012  T:  04/05/2012  Job:  [id number]

## 2012-04-05 NOTE — Brief Op Note (Signed)
04/04/2012 - 04/05/2012  2:02 AM  PATIENT:  [name]  31 y.o. female  PRE-OPERATIVE DIAGNOSIS:  appendicitis  POST-OPERATIVE DIAGNOSIS:  appendicitis  PROCEDURE:  Procedure(s) (LRB) with comments: APPENDECTOMY LAPAROSCOPIC (N/A)  SURGEON:  Surgeon(s) and Role:    * Lodema Pilot, DO - Primary  PHYSICIAN ASSISTANT:   ASSISTANTS: none   ANESTHESIA:   general  EBL:  Total I/O In: -  Out: 50 [Urine:50]  BLOOD ADMINISTERED:none  DRAINS: none   LOCAL MEDICATIONS USED:  MARCAINE    and LIDOCAINE   SPECIMEN:  Source of Specimen:  appendix  DISPOSITION OF SPECIMEN:  PATHOLOGY  COUNTS:  YES  TOURNIQUET:  * No tourniquets in log *  DICTATION: .Other Dictation: Dictation Number [id number]  PLAN OF CARE: Admit for overnight observation  PATIENT DISPOSITION:  PACU - hemodynamically stable.   Delay start of Pharmacological VTE agent (>24hrs) due to surgical blood loss or risk of bleeding: no

## 2012-04-05 NOTE — Transfer of Care (Signed)
Immediate Anesthesia Transfer of Care Note  Patient: [name]  Procedure(s) Performed: Procedure(s) (LRB) with comments: APPENDECTOMY LAPAROSCOPIC (N/A)  Patient Location: PACU  Anesthesia Type:General  Level of Consciousness: awake, alert , patient cooperative, lethargic and responds to stimulation  Airway & Oxygen Therapy: Patient Spontanous Breathing and Patient connected to face mask oxygen  Post-op Assessment: Report given to PACU RN, Post -op Vital signs reviewed and stable and Patient moving all extremities  Post vital signs: Reviewed and stable  Complications: No apparent anesthesia complications

## 2012-04-05 NOTE — Progress Notes (Signed)
Prior to first ordered dose of Lovenox post op, [employer] [phone] notified to assist with spanish translation of Lovenox teaching. Pt verbalized understanding of reasons for med and how it would be administered through teach back.

## 2012-04-05 NOTE — Progress Notes (Signed)
Pt assessment as well as introduction to plan of care for today regarding activity, diet, incentive spirometer use, and pain level explained with assistance of spanish interpreter [phone] at [facility]. Pt verbalized understanding and demonstrated understanding of incentive spirometry.

## 2012-04-05 NOTE — Progress Notes (Signed)
Day of Surgery  Subjective: Feels better this morning than preop.  Pain controlled with IV meds  Objective: Vital signs in last 24 hours: Temp:  [97.4 F (36.3 C)-98.2 F (36.8 C)] 97.9 F (36.6 C) (12/28 0611) Pulse Rate:  [67-114] 67  (12/28 0611) Resp:  [16-18] 16  (12/28 0245) BP: (94-106)/(61-70) 94/61 mmHg (12/28 0611) SpO2:  [99 %-100 %] 99 % (12/28 0611) Weight:  [121 lb 4.1 oz (55 kg)] 121 lb 4.1 oz (55 kg) (12/28 0245)    Intake/Output from previous day: 12/27 0701 - 12/28 0700 In: 1940 [I.V.:1940] Out: 50 [Urine:50] Intake/Output this shift:    General appearance: alert, cooperative and no distress Resp: nonlabored Cardio: normal rate, regular GI: soft, appropriate tenderness, ND, wounds okay  Lab Results:   [facility] 04/05/12 0558 04/04/12 1832  WBC 18.6* 17.8*  HGB 12.6 14.1  HCT 37.3 40.4  PLT 254 326   BMET  Basename 04/04/12 1832  NA 138  K 3.1*  CL 104  CO2 25  GLUCOSE 101*  BUN 8  CREATININE 0.47*  CALCIUM 9.5   PT/INR No results found for this basename: LABPROT:2,INR:2 in the last 72 hours ABG No results found for this basename: PHART:2,PCO2:2,PO2:2,HCO3:2 in the last 72 hours  Studies/Results: Ct Abdomen Pelvis W Contrast  04/04/2012  *RADIOLOGY REPORT*  Clinical Data: Right lower quadrant pain and emesis  CT ABDOMEN AND PELVIS WITH CONTRAST  Technique:  Multidetector CT imaging of the abdomen and pelvis was performed following the standard protocol during bolus administration of intravenous contrast.  Contrast: 80mL OMNIPAQUE IOHEXOL 300 MG/ML  SOLN  Comparison: None.  Findings: Lung bases are clear.  No pericardial or pleural effusion.  There is no focal liver abnormality.  Large gallstone is noted within the lumen of the gallbladder measuring 1.9 cm.  No biliary dilatation identified.  The pancreas appears normal.  Normal appearance of the spleen.  The adrenal glands are unremarkable.  The normal appearance of both kidneys.  Urinary  bladder appears normal.  Uterus and adnexal structures are unremarkable.  The stomach is normal.  The small bowel loops are unremarkable. The appendix is thickened with mucosal enhancement and periappendiceal fat stranding consistent with acute appendicitis. The appendix measures 11 mm in thickness.  Free fluid and fat stranding is noted within the right abdomen, image 41.  The colon is unremarkable.  No evidence for bowel perforation or abscess formation.  Review of the visualized osseous structures is unremarkable.  IMPRESSION:  1. Examination is positive for acute appendicitis.  No evidence for abscess formation. 2.  Gallstone.   Original Report Authenticated By: Signa Kell, M.D.     Anti-infectives: Anti-infectives     Start     Dose/Rate Route Frequency Ordered Stop   04/04/12 2300   ertapenem (INVANZ) 1 g in sodium chloride 0.9 % 50 mL IVPB        1 g 100 mL/hr over 30 Minutes Intravenous  Once 04/04/12 2246 04/05/12 0045          Assessment/Plan: s/p Procedure(s) (LRB) with comments: APPENDECTOMY LAPAROSCOPIC (N/A) advance diet and trial oral pain meds.  may be able to go home later today or tomorrow.  she says that she feels better than preop with a "different" type of pain.  no evidence of any postop complication.  LOS: 1 day    Lodema Pilot DAVID 04/05/2012

## 2012-04-05 NOTE — Care Management (Signed)
UR completed 

## 2012-04-05 NOTE — Anesthesia Postprocedure Evaluation (Signed)
  Anesthesia Post-op Note  Patient: [name]  Procedure(s) Performed: Procedure(s) (LRB) with comments: APPENDECTOMY LAPAROSCOPIC (N/A)  Patient Location: PACU  Anesthesia Type:General  Level of Consciousness: awake, oriented, patient cooperative and responds to stimulation  Airway and Oxygen Therapy: Patient Spontanous Breathing and Patient connected to nasal cannula oxygen  Post-op Pain: none  Post-op Assessment: Post-op Vital signs reviewed, Patient's Cardiovascular Status Stable, Respiratory Function Stable, Patent Airway, No signs of Nausea or vomiting, Pain level controlled and No residual motor weakness  Post-op Vital Signs: Reviewed and stable  Complications: No apparent anesthesia complications

## 2012-04-06 LAB — CBC WITH DIFFERENTIAL/PLATELET
Basophils Absolute: 0 10*3/uL (ref 0.0–0.1)
Eosinophils Absolute: 0 10*3/uL (ref 0.0–0.7)
Lymphocytes Relative: 15 % (ref 12–46)
Lymphs Abs: 1.9 10*3/uL (ref 0.7–4.0)
Neutrophils Relative %: 77 % (ref 43–77)
Platelets: 236 10*3/uL (ref 150–400)
RBC: 3.65 MIL/uL — ABNORMAL LOW (ref 3.87–5.11)
RDW: 12.8 % (ref 11.5–15.5)
WBC: 12.3 10*3/uL — ABNORMAL HIGH (ref 4.0–10.5)

## 2012-04-06 LAB — BASIC METABOLIC PANEL
CO2: 24 mEq/L (ref 19–32)
Calcium: 8.5 mg/dL (ref 8.4–10.5)
GFR calc non Af Amer: >90 mL/min (ref >90)
Glucose, Bld: 120 mg/dL — ABNORMAL HIGH (ref 70–99)
Potassium: 3.5 mEq/L (ref 3.5–5.1)
Sodium: 139 mEq/L (ref 135–145)

## 2012-04-06 MED ORDER — HYDROCODONE-ACETAMINOPHEN 5-325 MG PO TABS: 1.0000 | ORAL_TABLET | ORAL | Status: DC | PRN

## 2012-04-06 NOTE — ED Provider Notes (Signed)
Medical screening examination/treatment/procedure(s) were conducted as a shared visit with non-physician practitioner(s) and myself.  I personally evaluated the patient during the encounter  Pt with acute appendicitis.  Stabilized in the ED D/w dr Biagio Quint, will admit to OR  Joya Gaskins, MD 04/06/12 [phone]

## 2012-04-06 NOTE — Progress Notes (Signed)
1 Day Post-Op  Subjective: Continues to improve.  Tolerating diet. Minimal pain  Objective: Vital signs in last 24 hours: Temp:  [97.6 F (36.4 C)-98.5 F (36.9 C)] 98.4 F (36.9 C) (12/29 0601) Pulse Rate:  [66-84] 84  (12/29 0601) Resp:  [16] 16  (12/29 0601) BP: (91-101)/(59-69) 93/59 mmHg (12/29 0601) SpO2:  [99 %-100 %] 100 % (12/29 0601) Last BM Date: 04/04/12  Intake/Output from previous day: 12/28 0701 - 12/29 0700 In: 3450 [P.O.:840; I.V.:2610] Out: -  Intake/Output this shift:    General appearance: alert, cooperative and no distress Resp: clear to auscultation bilaterally Cardio: normal rate, regular GI: soft, miniimal tenderness, ND, wounds without infection.  Lab Results:   Basename 04/06/12 0455 04/05/12 0558  WBC 12.3* 18.6*  HGB 11.0* 12.6  HCT 33.2* 37.3  PLT 236 254   BMET  Basename 04/06/12 0455 04/04/12 1832  NA 139 138  K 3.5 3.1*  CL 106 104  CO2 24 25  GLUCOSE 120* 101*  BUN 8 8  CREATININE 0.55 0.47*  CALCIUM 8.5 9.5   PT/INR No results found for this basename: LABPROT:2,INR:2 in the last 72 hours ABG No results found for this basename: PHART:2,PCO2:2,PO2:2,HCO3:2 in the last 72 hours  Studies/Results: Ct Abdomen Pelvis W Contrast  04/04/2012  *RADIOLOGY REPORT*  Clinical Data: Right lower quadrant pain and emesis  CT ABDOMEN AND PELVIS WITH CONTRAST  Technique:  Multidetector CT imaging of the abdomen and pelvis was performed following the standard protocol during bolus administration of intravenous contrast.  Contrast: 80mL OMNIPAQUE IOHEXOL 300 MG/ML  SOLN  Comparison: None.  Findings: Lung bases are clear.  No pericardial or pleural effusion.  There is no focal liver abnormality.  Large gallstone is noted within the lumen of the gallbladder measuring 1.9 cm.  No biliary dilatation identified.  The pancreas appears normal.  Normal appearance of the spleen.  The adrenal glands are unremarkable.  The normal appearance of both kidneys.   Urinary bladder appears normal.  Uterus and adnexal structures are unremarkable.  The stomach is normal.  The small bowel loops are unremarkable. The appendix is thickened with mucosal enhancement and periappendiceal fat stranding consistent with acute appendicitis. The appendix measures 11 mm in thickness.  Free fluid and fat stranding is noted within the right abdomen, image 41.  The colon is unremarkable.  No evidence for bowel perforation or abscess formation.  Review of the visualized osseous structures is unremarkable.  IMPRESSION:  1. Examination is positive for acute appendicitis.  No evidence for abscess formation. 2.  Gallstone.   Original Report Authenticated By: Signa Kell, M.D.     Anti-infectives: Anti-infectives     Start     Dose/Rate Route Frequency Ordered Stop   04/04/12 2300   ertapenem (INVANZ) 1 g in sodium chloride 0.9 % 50 mL IVPB        1 g 100 mL/hr over 30 Minutes Intravenous  Once 04/04/12 2246 04/05/12 0045          Assessment/Plan: s/p Procedure(s) (LRB) with comments: APPENDECTOMY LAPAROSCOPIC (N/A) she looks good.  should be okay for discharge to home  LOS: 2 days    Lodema Pilot DAVID 04/06/2012

## 2012-04-06 NOTE — Discharge Summary (Signed)
  Physician Discharge Summary  Patient ID: Eyva Califano MRN: [id number] DOB/AGE: 1981/02/06 31 y.o.  Admit date: 04/04/2012 Discharge date: 04/06/2012  Admission Diagnoses: appendicitis  Discharge Diagnoses: appendicitis Active Problems:  * No active hospital problems. *    Discharged Condition: stable  Hospital Course: to OR 04/05/12 for lap appendectomy.  No apparent complications.  Diet advanced and pain well controlled.  Wbc decreased and stable for discharge on POD 1  Consults: None  Significant Diagnostic Studies: none   Treatments: surgery: lap appendectomy 04/05/12  Disposition:   Discharge Orders    Future Orders Please Complete By Expires   Diet - low sodium heart healthy      Increase activity slowly      Discharge instructions      Comments:   May shower. Call [phone] for follow up appointment in 2-3 weeks. May return to work in 2 weeks.   Call MD for:  temperature >100.4      Call MD for:  persistant nausea and vomiting      Call MD for:  severe uncontrolled pain      Call MD for:  redness, tenderness, or signs of infection (pain, swelling, redness, odor or green/yellow discharge around incision site)          Medication List     As of 04/06/2012  8:38 AM    STOP taking these medications         acetaminophen 325 MG tablet   Commonly known as: TYLENOL      TAKE these medications         HYDROcodone-acetaminophen 5-325 MG per tablet   Commonly known as: NORCO/VICODIN   Take 1 tablet by mouth every 4 (four) hours as needed.         SignedLodema Pilot DAVID 04/06/2012, 8:38 AM

## 2012-04-07 ENCOUNTER — Encounter (HOSPITAL_COMMUNITY): Payer: Self-pay | Admitting: General Surgery

## 2012-04-30 ENCOUNTER — Ambulatory Visit (INDEPENDENT_AMBULATORY_CARE_PROVIDER_SITE_OTHER): Payer: Self-pay | Admitting: General Surgery

## 2012-04-30 ENCOUNTER — Encounter (INDEPENDENT_AMBULATORY_CARE_PROVIDER_SITE_OTHER): Payer: Self-pay | Admitting: General Surgery

## 2012-04-30 VITALS — BP 110/60 | HR 62 | Temp 97.8°F | Resp 18 | Ht 63.0 in | Wt 116.0 lb

## 2012-04-30 DIAGNOSIS — Z5189 Encounter for other specified aftercare: Secondary | ICD-10-CM

## 2012-04-30 DIAGNOSIS — Z4889 Encounter for other specified surgical aftercare: Secondary | ICD-10-CM

## 2012-04-30 NOTE — Progress Notes (Signed)
Subjective:     Patient ID: [name], female   DOB: 10/09/1980, 32 y.o.   MRN: [id number]  HPI This patient follows up 3 weeks status post laparoscopic appendectomy for acute appendicitis. She is doing well and has no complaints. Her pathology was consistent with acute appendicitis. She says that she is not taking any pain medications and as return to work. Her appetite and bowels are functioning normally and has no residual discomfort.  Review of Systems     Objective:   Physical Exam In no acute distress and nontoxic-appearing sitting comfortably on the bed. her abdomen is soft and nontender to exam and her incisions are healing well without sign of infection    Assessment:     Status post laparoscopic appendectomy-doing well She's doing very well from the procedure and has no evidence of any postoperative complication. Her pathology is benign and she has returned to work. She can increase her activity as tolerated do nothing further to add. there is no evidence of any postoperative complications.    Plan:     She can follow up when necessary

## 2013-04-09 NOTE — L&D Delivery Note (Signed)
Delivery Note At 1:08 AM a viable female was delivered via Vaginal, Spontaneous Delivery (Presentation: ; Occiput Anterior).  APGAR: 7, 9; weight .   Placenta status: Intact, Spontaneous.  Cord: 3 vessels with the following complications: none  Anesthesia: Other  Episiotomy: n/a  Lacerations: 1st degree perineal Suture Repair: 3.0 vicryl Est. Blood Loss (mL): 250cc  Pt delivered a liveborn female via NSVD with spontaneous cry over intact perineum. Delivery complicated by a loose nuchal X1 which was reduced. Delivery of anterior shoulder delayed for approx 30sec, suprapubic pressure/mcroberts performed with success. Baby placed on maternal abdomen. Cord cut and clamped immediately . Placenta delivered intact with 3V cord via traction and pitocin. 1st degree tear repaired in the usual fashion. No complications. Mom and baby to postpartum  Mom to postpartum.  Baby to Couplet care / Skin to Skin.  Anselm LisMarsh, Jb Dulworth 10/08/2013, 1:43 AM

## 2013-04-09 NOTE — L&D Delivery Note (Signed)
I was present for delivery and agree with note above. Shayra Anton N Muhammad, CNM  

## 2013-05-18 ENCOUNTER — Other Ambulatory Visit (HOSPITAL_COMMUNITY): Payer: Self-pay | Admitting: Physician Assistant

## 2013-05-18 DIAGNOSIS — Z3689 Encounter for other specified antenatal screening: Secondary | ICD-10-CM

## 2013-05-18 LAB — OB RESULTS CONSOLE HIV ANTIBODY (ROUTINE TESTING): HIV: NONREACTIVE

## 2013-05-18 LAB — CYTOLOGY - PAP: PAP SMEAR: NORMAL

## 2013-05-18 LAB — OB RESULTS CONSOLE ANTIBODY SCREEN: Antibody Screen: NEGATIVE

## 2013-05-18 LAB — OB RESULTS CONSOLE ABO/RH: RH TYPE: POSITIVE

## 2013-05-18 LAB — OB RESULTS CONSOLE RPR: RPR: NONREACTIVE

## 2013-05-18 LAB — OB RESULTS CONSOLE PLATELET COUNT: Platelets: 273 10*3/uL

## 2013-05-18 LAB — OB RESULTS CONSOLE VARICELLA ZOSTER ANTIBODY, IGG: Varicella: IMMUNE

## 2013-05-18 LAB — GLUCOSE TOLERANCE, 1 HOUR (50G) W/O FASTING: Glucose, GTT - 1 Hour: 150 mg/dL (ref ?–200)

## 2013-05-18 LAB — OB RESULTS CONSOLE RUBELLA ANTIBODY, IGM: RUBELLA: IMMUNE

## 2013-05-18 LAB — OB RESULTS CONSOLE HGB/HCT, BLOOD
HCT: 37 %
Hemoglobin: 12.7 g/dL

## 2013-05-18 LAB — OB RESULTS CONSOLE GC/CHLAMYDIA
Chlamydia: NEGATIVE
Gonorrhea: NEGATIVE

## 2013-05-18 LAB — OB RESULTS CONSOLE HEPATITIS B SURFACE ANTIGEN: HEP B S AG: NEGATIVE

## 2013-05-21 LAB — GLUCOSE TOLERANCE, 3 HOURS
GLUCOSE 1 HOUR GTT: 159 mg/dL (ref ?–200)
GLUCOSE FASTING GTT: 77 mg/dL — AB (ref 80–110)
Glucose, GTT - 2 Hour: 149 mg/dL — AB (ref ?–140)
Glucose, GTT - 3 Hour: 120 mg/dL (ref ?–140)

## 2013-05-25 ENCOUNTER — Ambulatory Visit (HOSPITAL_COMMUNITY)
Admission: RE | Admit: 2013-05-25 | Discharge: 2013-05-25 | Disposition: A | Discharge status: Home / Self Care | Payer: Medicaid Other | Source: Ambulatory Visit | Attending: Physician Assistant | Admitting: Physician Assistant

## 2013-05-25 ENCOUNTER — Encounter (HOSPITAL_COMMUNITY): Payer: Self-pay

## 2013-05-25 DIAGNOSIS — Z3689 Encounter for other specified antenatal screening: Secondary | ICD-10-CM | POA: Insufficient documentation

## 2013-05-25 DIAGNOSIS — Z8751 Personal history of pre-term labor: Secondary | ICD-10-CM | POA: Insufficient documentation

## 2013-07-27 LAB — GLUCOSE TOLERANCE, 3 HOURS
GLUCOSE 2 HOUR GTT: 121 mg/dL (ref ?–140)
GLUCOSE FASTING GTT: 66 mg/dL — AB (ref 80–110)
Glucose, GTT - 1 Hour: 114 mg/dL (ref ?–200)
Glucose, GTT - 3 Hour: 113 mg/dL (ref ?–140)

## 2013-07-27 LAB — OB RESULTS CONSOLE RPR: RPR: NONREACTIVE

## 2013-07-27 LAB — OB RESULTS CONSOLE HGB/HCT, BLOOD
HEMATOCRIT: 34 %
HEMOGLOBIN: 11.2 g/dL

## 2013-08-24 ENCOUNTER — Encounter: Payer: Self-pay | Admitting: Family Medicine

## 2013-08-24 ENCOUNTER — Ambulatory Visit (INDEPENDENT_AMBULATORY_CARE_PROVIDER_SITE_OTHER): Payer: Medicaid Other | Admitting: Family Medicine

## 2013-08-24 VITALS — BP 105/67 | HR 80 | Temp 97.6°F | Ht <= 58 in | Wt 143.0 lb

## 2013-08-24 DIAGNOSIS — O09299 Supervision of pregnancy with other poor reproductive or obstetric history, unspecified trimester: Secondary | ICD-10-CM

## 2013-08-24 DIAGNOSIS — O0990 Supervision of high risk pregnancy, unspecified, unspecified trimester: Secondary | ICD-10-CM | POA: Insufficient documentation

## 2013-08-24 DIAGNOSIS — Z8759 Personal history of other complications of pregnancy, childbirth and the puerperium: Secondary | ICD-10-CM | POA: Insufficient documentation

## 2013-08-24 LAB — POCT URINALYSIS DIP (DEVICE)
Bilirubin Urine: NEGATIVE
Glucose, UA: NEGATIVE mg/dL
Hgb urine dipstick: NEGATIVE
KETONES UR: NEGATIVE mg/dL
NITRITE: NEGATIVE
PH: 7 (ref 5.0–8.0)
PROTEIN: NEGATIVE mg/dL
Specific Gravity, Urine: 1.025 (ref 1.005–1.030)
Urobilinogen, UA: 0.2 mg/dL (ref 0.0–1.0)

## 2013-08-24 NOTE — Progress Notes (Signed)
Nutrition Note: New OB consult Pt was normal BMI pregravid and wt gain is WNL today. Pt reports no N/V. NKFA. Pt reports good appetite and adequate intake. Pt taking PNV daily.   Pt walking most days of the week. [facility] Follow-up if referred.  Melanee LeftErin Cashwell, MPH RD LDN

## 2013-08-24 NOTE — Progress Notes (Signed)
Patient reports pelvic pressure/pain 

## 2013-08-24 NOTE — Progress Notes (Signed)
New OB transfer fro [facility]--all labs wnl H/o IUFD at 36 wks, 1st pregnancy-needs serial u/s for growth and 2x/wk testing and delivery at 39 wks. Begin NST's today

## 2013-08-24 NOTE — Patient Instructions (Signed)
Tercer trimestre del embarazo  (Third Trimester of Pregnancy) El tercer trimestre del embarazo abarca desde la semana 29 hasta la semana 42, desde el 7 mes hasta el 9. En este trimestre el feto se desarrolla muy rpidamente. Hacia el final del noveno mes, el beb que an no ha nacido mide alrededor de 20 pulgadas (45 cm) de largo y pesa entre 6 y 10 libras (2,700 y 4,500 kg).  CAMBIOS CORPORALES  Su organismo atravesar numerosos cambios durante el embarazo. Los cambios varan de una mujer a otra.   Seguir aumentando de peso. Es esperable que aumente entre 25 y 35 libras (11 16 kg) hacia el final del embarazo.  Podrn aparecer las primeras estras en las caderas, abdomen y mamas.  Tendr necesidad de orinar con ms frecuencia porque el feto baja hacia la pelvis y presiona en la vejiga.  Como consecuencia del embarazo, podr sentir acidez estomacal continuamente.  Podr estar constipada ya que ciertas hormonas hacen que los msculos que hacen progresar los desechos a travs de los intestinos trabajen ms lentamente.  Pueden aparecer hemorroides o abultarse e hincharse las venas (venas varicosas).  Podr sentir dolor plvico debido al aumento de peso ya que las hormonas del embarazo relajan las articulaciones entre los huesos de la pelvis. El dolor de espalda puede ser consecuencia de la exigencia de los msculos que soportan la postura.  Sus mamas seguirn desarrollndose y estarn ms sensibles. A veces sale una secrecin amarilla de las mamas, que se llama calostro.  El ombligo puede salir hacia afuera.  Podr sentir que le falta el aire debido a que se expande el tero.  Podr notar que el feto "baja" o que se siente ms bajo en el abdomen.  Podr tener una prdida de secrecin mucosa con sangre. Esto suele ocurrir entre unos pocos das y una semana antes del parto.  El cuello se vuelve delgado y blando (se borra) cerca de la fecha de parto. QU DEBE ESPERAR EN LAS CONSULTAS  PRENATALES  Le harn exmenes prenatales cada 2 semanas hasta la semana 36. A partir de ese momento le harn exmenes semanales. Durante una visita prenatal de rutina:   La pesarn para verificar que usted y el feto se encuentran dentro de los lmites normales.  Le tomarn la presin arterial.  Le medirn el abdomen para verificar el desarrollo del beb.  Escucharn los latidos fetales.  Se evaluarn los resultados de los estudios solicitados en visitas anteriores.  Le controlarn el cuello del tero cuando est prxima la fecha de parto para ver si se ha borrado. Alrededor de la semana 36 el mdico controlar el cuello del tero. Al mismo tiempo realizar un anlisis de las secreciones del tejido vaginal. Este examen es para determinar si hay un tipo de bacteria, estreptococo Grupo B. El mdico le explicar esto con ms detalle.  El mdico podr preguntarle:   Como le gustara que fuera el parto.  Cmo se siente.  Si siente los movimientos del beb.  Si tiene sntomas anormales, como prdida de lquido, sangrado, dolores de cabeza intenso o clicos abdominales.  Si tiene alguna duda. Otros estudios que podrn realizarse durante el tercer trimestre son:   Anlisis de sangre para controlar sus niveles de hierro (anemia).  Controles fetales para determinar su salud, el nivel de actividad y su desarrollo. Si tiene alguna enfermedad o si tuvo problemas durante el embarazo, le harn estudios. FALSO TRABAJO DE PARTO  Es posible que sienta contracciones pequeas e irregulares que finalmente   desaparecen. Se llaman contracciones de Braxton Hicks o falso trabajo de parto. Las contracciones pueden durar horas, das o an semanas antes de que el verdadero trabajo de parto se inicie. Si las contracciones tienen intervalos regulares, se intensifican o se hacen dolorosas, lo mejor es que la revise su mdico.  SIGNOS DE TRABAJO DE PARTO   Espasmos del tipo menstrual.  Contracciones cada 5  minutos o menos.  Contracciones que comienzan en la parte superior del tero y se expanden hacia abajo, a la zona inferior del abdomen y la espalda.  Sensacin de presin que aumenta en la pelvis o dolor en la espalda.  Aparece una secrecin acuosa o sanguinolenta por la vagina. Si tiene alguno de estos signos antes de la semana 37 del embarazo, llame a su mdico inmediatamente. Debe concurrir al hospital para ser controlada inmediatamente.  INSTRUCCIONES PARA EL CUIDADO EN EL HOGAR   Evite fumar, consumir hierbas, beber alcohol y utilizar frmacos que no le hayan recetado. Estas sustancias qumicas afectan la formacin y el desarrollo del beb.  Siga las indicaciones del profesional con respecto a como tomar los medicamentos. Durante el embarazo, hay medicamentos que son seguros y otros no lo son.  Realice actividad fsica slo segn las indicaciones del mdico. Sentir clicos uterinos es el mejor signo para detener la actividad fsica.  Contine haciendo comidas regulares y sanas.  Use un sostn que le brinde buen soporte si sus mamas estn sensibles.  No utilice la baera con agua caliente, baos turcos o saunas.  Colquese el cinturn de seguridad cuando conduzca.  Evite comer carne cruda queso sin cocinar y el contacto con los utensilios y desperdicios de los gatos. Estos elementos contienen grmenes que pueden causar defectos de nacimiento en el beb.  Tome las vitaminas indicadas para la etapa prenatal.  Pruebe un laxante (si el mdico la autoriza) si tiene constipacin. Consuma ms alimentos ricos en fibra, como vegetales y frutas frescos y cereales enteros. Beba gran cantidad de lquido para mantener la orina de tono claro o amarillo plido.  Tome baos de agua tibia para calmar el dolor o las molestias causadas por las hemorroides. Use una crema para las hemorroides si el mdico la autoriza.  Si tiene venas varicosas, use medias de soporte. Eleve los pies durante 15 minutos,  3 4 veces por da. Limite el consumo de sal en su dieta.  Evite levantar objetos pesados, use zapatos de tacones bajos y mantenga una buena postura.  Descanse con las piernas elevadas si tiene calambres o dolor de cintura.  Visite a su dentista si no lo ha hecho durante el embarazo. Use un cepillo de dientes blando para higienizarse los dientes y use suavemente el hilo dental.  Puede continuar su vida sexual excepto que el mdico le indique otra cosa.  No haga viajes largos excepto que sea absolutamente necesario y slo con la aprobacin de su mdico.  Tome clases prenatales para entender, practicar y hacer preguntas sobre el trabajo de parto y el alumbramiento.  Haga un ensayo sobre la partida al hospital.  Prepare el bolso que llevar al hospital.  Prepare la habitacin del beb.  Contine concurriendo a todas las visitas prenatales segn las indicaciones de su mdico. SOLICITE ATENCIN MDICA SI:   No est segura si est en trabajo de parto o ha roto la bolsa de aguas.  Tiene mareos.  Siente clicos leves, presin en la pelvis o dolor persistente en el abdomen.  Tiene nuseas o vmitos persistentes.  Observa una   secrecin vaginal con mal olor.  Siente dolor al orinar. SOLICITE ATENCIN MDICA DE INMEDIATO SI:   Tiene fiebre.  Pierde lquido o sangre por la vagina.  Tiene sangrado o pequeas prdidas vaginales.  Siente dolor intenso o clicos en el abdomen.  Sube o baja de peso rpidamente.  Le falta el aire y le duele el pecho al respirar.  Sbitamente se le hincha el rostro, las manos, los tobillos, los pies o las piernas de manera extrema.  No ha sentido los movimientos del beb durante una hora.  Siente un dolor de cabeza intenso que no se alivia con medicamentos.  Su visin se modifica. Document Released: 01/03/2005 Document Revised: 11/26/2012 [employer] Patient Information 2014 [employer].  Lactancia materna (Breastfeeding) Decidir amamantar es  una de las mejores elecciones que puede hacer por usted y su beb. El cambio hormonal durante el embarazo produce el desarrollo del tejido mamario y aumenta la cantidad y el tamao de los conductos galactforos. Estas hormonas tambin permiten que las protenas, los azcares y las grasas de la sangre produzcan la leche materna en las glndulas productoras de leche. Las hormonas impiden que la leche materna sea liberada antes del nacimiento del beb, adems de impulsar el flujo de leche luego del nacimiento. Una vez que ha comenzado a amamantar, pensar en el beb, as como la succin o el llanto, pueden estimular la liberacin de leche de las glndulas productoras de leche.  LOS BENEFICIOS DE AMAMANTAR Para el beb  La primera leche (calostro) ayuda al mejor funcionamiento del sistema digestivo del beb.  La leche tiene anticuerpos que ayudan a prevenir las infecciones en el beb.  El beb tiene una menor incidencia de asma, alergias y del sndrome de muerte sbita del lactante.  Los nutrientes en la leche materna son mejores para el beb que la leche maternizada y estn preparados exclusivamente para cubrir las necesidades del beb.  La leche materna mejora el desarrollo cerebral del beb.  Es menos probable que el beb desarrolle otras enfermedades, como obesidad infantil, asma o diabetes mellitus de tipo 2. Para usted   La lactancia materna favorece el desarrollo de un vnculo muy especial entre la madre y el beb.  Es conveniente. Siempre est disponible a la temperatura correcta y es econmica.  La lactancia materna ayuda a quemar caloras y a perder el peso ganado durante el embarazo.  Favorece la contraccin del tero al tamao que tena antes del embarazo de manera ms rpida y disminuye el sangrado (loquios) despus del parto.  La lactancia materna contribuye a reducir el riesgo de desarrollar diabetes mellitus de tipo 2, osteoporosis o cncer de mama o de ovario en el  futuro. SIGNOS DE QUE EL BEB EST HAMBRIENTO Primeros signos de hambre  Aumenta su estado de alerta o actividad.  Se estira.  Mueve la cabeza de un lado a otro.  Mueve la cabeza y abre la boca cuando se le toca la mejilla o la comisura de la boca (reflejo de bsqueda).  Aumenta las vocalizaciones, tales como sonidos de succin, se relame los labios, emite arrullos, suspiros, o chirridos.  Mueve la mano hacia la boca.  Se chupa con ganas los dedos o las manos. Signos tardos de hambre  Est agitado.  Llora de manera intermitente. Signos de hambre extrema Los signos de hambre extrema requerirn que lo calme y lo consuele antes de que el beb pueda alimentarse adecuadamente. No espere a que se manifiesten los siguientes signos de hambre extrema para comenzar a   amamantar:   Agitacin.  Llanto intenso y fuerte.   Gritos. INFORMACIN BSICA SOBRE LA LACTANCIA MATERNA Iniciacin de la lactancia materna  Encuentre un lugar cmodo para sentarse o acostarse, con un buen respaldo para el cuello y la espalda.  Coloque una almohada o una manta enrollada debajo del beb para acomodarlo a la altura de la mama (si est sentada). Las almohadas para amamantar se han diseado especialmente a fin de servir de apoyo para los brazos y el beb mientras amamanta.  Asegrese de que el abdomen del beb est frente al suyo.  Masajee suavemente la mama. Con las yemas de los dedos, masajee la pared del pecho hacia el pezn en un movimiento circular. Esto estimula el flujo de leche. Es posible que deba continuar este movimiento mientras amamanta si la leche fluye lentamente.  Sostenga la mama con el pulgar por arriba del pezn y los otros 4 dedos por debajo de la mama. Asegrese de que los dedos se encuentren lejos del pezn y de la boca del beb.  Empuje suavemente los labios del beb con el pezn o con el dedo.  Cuando la boca del beb se abra lo suficiente, acrquelo rpidamente a la mama e  introduzca todo el pezn y la zona oscura que lo rodea (areola), tanto como sea posible, dentro de la boca del beb.  Debe haber ms areola visible por arriba del labio superior del beb que por debajo del labio inferior.  La lengua del beb debe estar entre la enca inferior y la mama.  Asegrese de que la boca del beb est en la posicin correcta alrededor del pezn (prendida). Los labios del beb deben crear un sello sobre la mama, doblndose hacia afuera (invertidos).  Es comn que el beb succione durante 2 a 3 minutos para que comience el flujo de leche materna. Cmo debe prenderse Es muy importante que le ensee al beb cmo prenderse adecuadamente a la mama. Si el beb no se prende adecuadamente, puede causarle dolor en el pezn y reducir la produccin de leche materna, y hacer que el beb tenga un escaso aumento de peso. Adems, si el beb no se prende adecuadamente al pezn, puede tragar aire durante la alimentacin. Esto puede causarle molestias al beb. Hacer eructar al beb al cambiar de mama puede ayudarlo a liberar el aire. Sin embargo, ensearle al beb cmo prenderse a la mama adecuadamente es la mejor manera de evitar que se sienta molesto por tragar aire mientras se alimenta. Signos de que el beb se ha prendido adecuadamente al pezn:   Tironea o succiona de modo silencioso, sin causarle dolor.  Se escucha que traga cada 3 o 4 succiones.   Hay movimientos musculares por arriba y por delante de sus odos al succionar. Signos de que el beb no se ha prendido adecuadamente al pezn:   Hace ruidos de succin o de chasquido mientras se alimenta.  Dolor en el pezn. Si cree que el beb no se prendi correctamente, deslice el dedo en la comisura de la boca y colquelo entre las encas del beb para interrumpir la succin. Intente comenzar a amamantar nuevamente. Signos de lactancia materna exitosa Signos del beb:   Disminucin gradual en el nmero de succiones o cese  completo de la succin.  Se duerme.  Relaja el cuerpo.  Retiene una pequea cantidad de leche en su boca.  Se desprende solo del pecho. Signos que presenta usted:  Las mamas han aumentado la firmeza, el peso y el tamao   1 a 3 horas despus de amamantar.  Estn ms blandas inmediatamente despus de amamantar.  Un aumento del volumen de leche, y tambin el cambio de su consistencia y color se producen hacia el quinto da de lactancia materna.  Los pezones no duelen, ni estn agrietados ni sangran. Signos de que su beb recibe la cantidad de leche suficiente  Moja al menos 3 paales en 24 horas. La orina debe ser clara y de color amarillo plido a los 5 das de vida.  Defeca al menos 3 veces en 24 horas a los 5 das de vida. La materia fecal debe ser blanda y amarillenta.  Defeca al menos 3 veces en 24 horas a los 7 das de vida. La materia fecal debe ser grumosa y amarillenta.  No registra una prdida de peso mayor del 10% del peso al nacer durante los primeros 3 das de vida.  Aumenta de peso un promedio de 4 a 7onzas (120 a 210ml) por semana despus de los 4 das de vida.  Aumenta de peso, diariamente, de manera consistente a partir de los 5 das de vida, sin registrar prdida de peso despus de las 2 semanas de vida. Despus de alimentarse, es posible que el beb regurgite una pequea cantidad. Esto es frecuente. FRECUENCIA Y DURACIN DE LA LACTANCIA MATERNA El amamantamiento frecuente la ayudar a producir ms leche y a prevenir problemas de dolor en los pezones e hinchazn en las mamas. Alimente al beb cuando muestre signos de hambre o si siente la necesidad de reducir la congestin de las mamas. Esto se denomina "lactancia a demanda". Evite el uso del chupete mientras trabaja para establecer la lactancia (las primeras 4 a 6 semanas despus del nacimiento del beb). Despus de este perodo, podr ofrecerle un chupete. Las investigaciones demostraron que el uso del chupete  durante el primer ao de vida del beb disminuye el riesgo de desarrollar el sndrome de muerte sbita del lactante (SMSL). Permita que el nio se alimente en cada mama todo lo que desee. Contine amamantando al beb hasta que haya terminado de alimentarse. Cuando el beb se desprende o se queda dormido mientras se est alimentando de la primera mama, ofrzcale la segunda. Debido a que, con frecuencia, los recin nacidos permanecen somnolientos las primeras semanas de vida, es posible que deba despertar a su beb para alimentarlo. Los horarios de lactancia varan de un beb a otro. Sin embargo, las siguientes reglas pueden servir como gua para ayudarle a garantizar que el beb se alimenta adecuadamente:  Se puede amamantar a los recin nacidos (bebs de 4 semanas o menos de vida) cada 1 a 3 horas.  No deben transcurrir ms de 3 horas durante el da o 5 horas durante la noche sin que se amamante a los recin nacidos.  Debe amamantar al beb 8 veces como mnimo, en un perodo de 24 horas, hasta que comience a introducir slidos en su dieta, a los 6 meses de vida aproximadamente. EXTRACCIN DE LECHE MATERNA La extraccin y el almacenamiento de la leche materna le permiten asegurarse de que el beb se alimente exclusivamente de leche materna, aun en momentos en los que no puede amamantar. Esto tiene especial importancia si debe regresar al trabajo en el perodo en que an est amamantando o si no puede estar presente en los momentos en que el beb debe alimentarse. Su asesor en lactancia puede orientarla sobre cunto tiempo es seguro almacenar leche materna.  El sacaleche es un aparato que le permite extraer leche   de la mama a un recipiente estril. Luego, la leche materna extrada puede almacenarse en un refrigerador o freezer. Algunos sacaleches son manuales, mientras que otros son elctricos. Consulte a su asesor en lactancia qu tipo ser ms conveniente para usted. Los sacaleches se pueden comprar, sin  embargo, algunos hospitales y grupos de apoyo a la lactancia materna alquilan sacaleches mensualmente. Un asesor en lactancia puede ensearle cmo extraer leche materna manualmente, en caso de que prefiera no usar un sacaleche.  CMO CUIDAR LAS MAMAS DURANTE LA LACTANCIA MATERNA Los pezones se secan, agrietan y duelen durante la lactancia materna. Las siguientes recomendaciones pueden ayudarle a mantener las mamas humectadas y sanas:  Evite usar jabn en los pezones.  Use un sostn de soporte. Aunque no son esenciales, las camisetas sin mangas o los sostenes especiales para amamantar estn diseados para acceder fcilmente a las mamas, para amamantar sin tener que quitarse todo el sostn o la camiseta. Evite usar sostenes con aro o sostenes muy ajustados.  Seque al aire sus pezones durante 3 a 4minutos despus de amamantar al beb.  Utilice solo apsitos de algodn en el sostn para absorber las prdidas de leche. La prdida de un poco de leche materna entre las tomas es normal.  Utilice lanolina sobre los pezones luego de amamantar. La lanolina ayuda a mantener la humedad normal de la piel. Si usa lanolina pura, no tiene que lavarse los pezones antes de alimentar al beb. La lanolina pura no es txica para el beb. Adems, puede extraer manualmente algunas gotas de leche materna y masajear suavemente esa leche sobre los pezones, para que la leche se seque al aire. Durante las primeras semanas despus de dar a luz, algunas mujeres pueden experimentar hinchazn en las mamas (congestin mamaria). La congestin puede hacer que sienta las mamas pesadas, calientes y sensibles al tacto. El pico de la congestin ocurre dentro de los 3 a 5 das despus del parto. Las siguientes recomendaciones pueden ayudarle a aliviar la congestin:  Vace por completo las mamas al amamantar o extraer leche. Puede aplicar calor hmedo en las mamas (en la ducha o con toallas hmedas para manos) antes de amamantar o extraer  leche. Esto aumenta la circulacin y ayuda a que la leche fluya. Si el beb no vaca por completo las mamas cuando lo amamanta, extraiga la leche restante despus de que haya finalizado.  Use un sostn ajustado (para amamantar o comn) o camiseta sin mangas durante 1 o 2 das para indicar al cuerpo que disminuya ligeramente la produccin de leche.  Aplique compresas de hielo sobre las mamas, a menos que le resulte demasiado incmodo.  Asegrese de que el beb se encuentre en la posicin correcta mientras lo alimenta. Si la congestin persiste luego de 48 horas o despus de seguir estas recomendaciones, comunquese con su mdico o un asesor en lactancia. RECOMENDACIONES GENERALES PARA EL CUIDADO DE LA SALUD DURANTE LA LACTANCIA MATERNA  Consuma alimentos saludables. Alterne comidas y colaciones, comiendo 3 de cada una por da. Dado que lo que come afecta la leche materna, es posible que algunas comidas hagan que su beb se vuelva ms irritable de lo habitual. Evite comer este tipo de alimentos, si percibe que afectan de manera negativa al beb.  Beba leche, jugos de fruta y agua para satisfacer su sed (aproximadamente 10 vasos al da).  Descanse con frecuencia, reljese y tome sus vitaminas prenatales para evitar la fatiga, el estrs y la anemia.  Contine con los autocontroles de la mama.    Evite masticar y fumar tabaco.  Evite el consumo de alcohol y drogas. Algunos medicamentos, que pueden ser perjudiciales para el beb, pueden pasar a travs de la leche materna. Es importante que consulte a su mdico antes de tomar cualquier medicamento, incluidos todos los medicamentos recetados y de venta libre, as como los suplementos vitamnicos y herbales. Puede quedar embarazada durante la lactancia. Si desea controlar la natalidad, consulte a su mdico cules son las opciones ms seguras para el beb. SOLICITE ATENCIN MDICA SI:   Usted siente que quiere dejar de amamantar o se siente frustrada  con la lactancia.  Siente dolor en las mamas o en los pezones.  Sus pezones estn agrietados o sangran.  Sus pechos estn irritados, sensibles o calientes.  Tiene un rea hinchada en cualquiera de las mamas.  Siente escalofros o fiebre.  Tiene nuseas o vmitos.  Presenta una secrecin de otro lquido distinto de la leche materna de los pezones.  Sus mamas no se llenan antes de amamantar al beb para el 5. da despus del parto.  Se siente triste y deprimida.  El beb est demasiado somnoliento como para comer bien.  El beb tiene problemas para dormir.  Moja menos de 3 paales en 24 horas.  Defeca menos de 3 veces en 24 horas.  La piel del beb o la parte blanca de sus ojos est amarilla.  El beb no ha aumentado de peso a los 5 das de vida. SOLICITE ATENCIN MDICA DE INMEDIATO SI:   El beb est muy cansado (aletargado) y no se despierta para comer.  Le sube la fiebre sin causa. Document Released: 03/26/2005 Document Revised: 07/21/2012 [employer] Patient Information 2014 [employer].  

## 2013-08-27 ENCOUNTER — Ambulatory Visit (INDEPENDENT_AMBULATORY_CARE_PROVIDER_SITE_OTHER): Payer: Self-pay | Admitting: *Deleted

## 2013-08-27 VITALS — BP 101/63 | HR 83 | Wt 142.9 lb

## 2013-08-27 DIAGNOSIS — O09299 Supervision of pregnancy with other poor reproductive or obstetric history, unspecified trimester: Secondary | ICD-10-CM

## 2013-08-27 DIAGNOSIS — Z8759 Personal history of other complications of pregnancy, childbirth and the puerperium: Secondary | ICD-10-CM

## 2013-08-27 NOTE — Progress Notes (Signed)
NST

## 2013-08-27 NOTE — Addendum Note (Signed)
Addended by: Vontae Court E on: 08/27/2013 01:27 PM   Modules accepted: Level of Service  

## 2013-09-03 ENCOUNTER — Ambulatory Visit (INDEPENDENT_AMBULATORY_CARE_PROVIDER_SITE_OTHER): Payer: Self-pay | Admitting: *Deleted

## 2013-09-03 VITALS — BP 100/61 | HR 90

## 2013-09-03 DIAGNOSIS — Z8759 Personal history of other complications of pregnancy, childbirth and the puerperium: Secondary | ICD-10-CM

## 2013-09-03 DIAGNOSIS — O09299 Supervision of pregnancy with other poor reproductive or obstetric history, unspecified trimester: Secondary | ICD-10-CM

## 2013-09-04 NOTE — Progress Notes (Signed)
NST reviewed and reactive.  Valoria Tamburri L. Harraway-Smith, M.D., FACOG    

## 2013-09-07 ENCOUNTER — Ambulatory Visit (HOSPITAL_COMMUNITY)
Admission: RE | Admit: 2013-09-07 | Discharge: 2013-09-07 | Disposition: A | Discharge status: Home / Self Care | Payer: Medicaid Other | Source: Ambulatory Visit | Attending: Family Medicine | Admitting: Family Medicine

## 2013-09-07 ENCOUNTER — Ambulatory Visit (INDEPENDENT_AMBULATORY_CARE_PROVIDER_SITE_OTHER): Payer: Medicaid Other | Admitting: Family Medicine

## 2013-09-07 VITALS — BP 104/58 | HR 90 | Wt 146.1 lb

## 2013-09-07 DIAGNOSIS — Z3689 Encounter for other specified antenatal screening: Secondary | ICD-10-CM | POA: Insufficient documentation

## 2013-09-07 DIAGNOSIS — O09299 Supervision of pregnancy with other poor reproductive or obstetric history, unspecified trimester: Secondary | ICD-10-CM

## 2013-09-07 DIAGNOSIS — Z8759 Personal history of other complications of pregnancy, childbirth and the puerperium: Secondary | ICD-10-CM

## 2013-09-07 LAB — POCT URINALYSIS DIP (DEVICE)
Bilirubin Urine: NEGATIVE
GLUCOSE, UA: NEGATIVE mg/dL
Ketones, ur: NEGATIVE mg/dL
NITRITE: NEGATIVE
PROTEIN: NEGATIVE mg/dL
Specific Gravity, Urine: 1.02 (ref 1.005–1.030)
Urobilinogen, UA: 0.2 mg/dL (ref 0.0–1.0)
pH: 7 (ref 5.0–8.0)

## 2013-09-07 NOTE — Progress Notes (Signed)
No complaints today:  NST

## 2013-09-07 NOTE — Progress Notes (Signed)
S: 33 yo [id number] here for ROBV - +FM. No lof, vb, ctx - NST today for hx of still birth  O: see flowsheet  A/p - NST reviewed- cat I tracing. 140s, mod var +accels - PTL precautions discussed - cont twice weekly testing

## 2013-09-07 NOTE — Patient Instructions (Signed)
Tercer trimestre del embarazo  (Third Trimester of Pregnancy) El tercer trimestre del embarazo abarca desde la semana 29 hasta la semana 42, desde el 7 mes hasta el 9. En este trimestre el feto se desarrolla muy rpidamente. Hacia el final del noveno mes, el beb que an no ha nacido mide alrededor de 20 pulgadas (45 cm) de largo y pesa entre 6 y 10 libras (2,700 y 4,500 kg).  CAMBIOS CORPORALES  Su organismo atravesar numerosos cambios durante el embarazo. Los cambios varan de una mujer a otra.   Seguir aumentando de peso. Es esperable que aumente entre 25 y 35 libras (11 16 kg) hacia el final del embarazo.  Podrn aparecer las primeras estras en las caderas, abdomen y mamas.  Tendr necesidad de orinar con ms frecuencia porque el feto baja hacia la pelvis y presiona en la vejiga.  Como consecuencia del embarazo, podr sentir acidez estomacal continuamente.  Podr estar constipada ya que ciertas hormonas hacen que los msculos que hacen progresar los desechos a travs de los intestinos trabajen ms lentamente.  Pueden aparecer hemorroides o abultarse e hincharse las venas (venas varicosas).  Podr sentir dolor plvico debido al aumento de peso ya que las hormonas del embarazo relajan las articulaciones entre los huesos de la pelvis. El dolor de espalda puede ser consecuencia de la exigencia de los msculos que soportan la postura.  Sus mamas seguirn desarrollndose y estarn ms sensibles. A veces sale una secrecin amarilla de las mamas, que se llama calostro.  El ombligo puede salir hacia afuera.  Podr sentir que le falta el aire debido a que se expande el tero.  Podr notar que el feto "baja" o que se siente ms bajo en el abdomen.  Podr tener una prdida de secrecin mucosa con sangre. Esto suele ocurrir entre unos pocos das y una semana antes del parto.  El cuello se vuelve delgado y blando (se borra) cerca de la fecha de parto. QU DEBE ESPERAR EN LAS CONSULTAS  PRENATALES  Le harn exmenes prenatales cada 2 semanas hasta la semana 36. A partir de ese momento le harn exmenes semanales. Durante una visita prenatal de rutina:   La pesarn para verificar que usted y el feto se encuentran dentro de los lmites normales.  Le tomarn la presin arterial.  Le medirn el abdomen para verificar el desarrollo del beb.  Escucharn los latidos fetales.  Se evaluarn los resultados de los estudios solicitados en visitas anteriores.  Le controlarn el cuello del tero cuando est prxima la fecha de parto para ver si se ha borrado. Alrededor de la semana 36 el mdico controlar el cuello del tero. Al mismo tiempo realizar un anlisis de las secreciones del tejido vaginal. Este examen es para determinar si hay un tipo de bacteria, estreptococo Grupo B. El mdico le explicar esto con ms detalle.  El mdico podr preguntarle:   Como le gustara que fuera el parto.  Cmo se siente.  Si siente los movimientos del beb.  Si tiene sntomas anormales, como prdida de lquido, sangrado, dolores de cabeza intenso o clicos abdominales.  Si tiene alguna duda. Otros estudios que podrn realizarse durante el tercer trimestre son:   Anlisis de sangre para controlar sus niveles de hierro (anemia).  Controles fetales para determinar su salud, el nivel de actividad y su desarrollo. Si tiene alguna enfermedad o si tuvo problemas durante el embarazo, le harn estudios. FALSO TRABAJO DE PARTO  Es posible que sienta contracciones pequeas e irregulares que finalmente   desaparecen. Se llaman contracciones de Braxton Hicks o falso trabajo de parto. Las contracciones pueden durar horas, das o an semanas antes de que el verdadero trabajo de parto se inicie. Si las contracciones tienen intervalos regulares, se intensifican o se hacen dolorosas, lo mejor es que la revise su mdico.  SIGNOS DE TRABAJO DE PARTO   Espasmos del tipo menstrual.  Contracciones cada 5  minutos o menos.  Contracciones que comienzan en la parte superior del tero y se expanden hacia abajo, a la zona inferior del abdomen y la espalda.  Sensacin de presin que aumenta en la pelvis o dolor en la espalda.  Aparece una secrecin acuosa o sanguinolenta por la vagina. Si tiene alguno de estos signos antes de la semana 37 del embarazo, llame a su mdico inmediatamente. Debe concurrir al hospital para ser controlada inmediatamente.  INSTRUCCIONES PARA EL CUIDADO EN EL HOGAR   Evite fumar, consumir hierbas, beber alcohol y utilizar frmacos que no le hayan recetado. Estas sustancias qumicas afectan la formacin y el desarrollo del beb.  Siga las indicaciones del profesional con respecto a como tomar los medicamentos. Durante el embarazo, hay medicamentos que son seguros y otros no lo son.  Realice actividad fsica slo segn las indicaciones del mdico. Sentir clicos uterinos es el mejor signo para detener la actividad fsica.  Contine haciendo comidas regulares y sanas.  Use un sostn que le brinde buen soporte si sus mamas estn sensibles.  No utilice la baera con agua caliente, baos turcos o saunas.  Colquese el cinturn de seguridad cuando conduzca.  Evite comer carne cruda queso sin cocinar y el contacto con los utensilios y desperdicios de los gatos. Estos elementos contienen grmenes que pueden causar defectos de nacimiento en el beb.  Tome las vitaminas indicadas para la etapa prenatal.  Pruebe un laxante (si el mdico la autoriza) si tiene constipacin. Consuma ms alimentos ricos en fibra, como vegetales y frutas frescos y cereales enteros. Beba gran cantidad de lquido para mantener la orina de tono claro o amarillo plido.  Tome baos de agua tibia para calmar el dolor o las molestias causadas por las hemorroides. Use una crema para las hemorroides si el mdico la autoriza.  Si tiene venas varicosas, use medias de soporte. Eleve los pies durante 15 minutos,  3 4 veces por da. Limite el consumo de sal en su dieta.  Evite levantar objetos pesados, use zapatos de tacones bajos y mantenga una buena postura.  Descanse con las piernas elevadas si tiene calambres o dolor de cintura.  Visite a su dentista si no lo ha hecho durante el embarazo. Use un cepillo de dientes blando para higienizarse los dientes y use suavemente el hilo dental.  Puede continuar su vida sexual excepto que el mdico le indique otra cosa.  No haga viajes largos excepto que sea absolutamente necesario y slo con la aprobacin de su mdico.  Tome clases prenatales para entender, practicar y hacer preguntas sobre el trabajo de parto y el alumbramiento.  Haga un ensayo sobre la partida al hospital.  Prepare el bolso que llevar al hospital.  Prepare la habitacin del beb.  Contine concurriendo a todas las visitas prenatales segn las indicaciones de su mdico. SOLICITE ATENCIN MDICA SI:   No est segura si est en trabajo de parto o ha roto la bolsa de aguas.  Tiene mareos.  Siente clicos leves, presin en la pelvis o dolor persistente en el abdomen.  Tiene nuseas o vmitos persistentes.  Observa una   secrecin vaginal con mal olor.  Siente dolor al orinar. SOLICITE ATENCIN MDICA DE INMEDIATO SI:   Tiene fiebre.  Pierde lquido o sangre por la vagina.  Tiene sangrado o pequeas prdidas vaginales.  Siente dolor intenso o clicos en el abdomen.  Sube o baja de peso rpidamente.  Le falta el aire y le duele el pecho al respirar.  Sbitamente se le hincha el rostro, las manos, los tobillos, los pies o las piernas de manera extrema.  No ha sentido los movimientos del beb durante una hora.  Siente un dolor de cabeza intenso que no se alivia con medicamentos.  Su visin se modifica. Document Released: 01/03/2005 Document Revised: 11/26/2012 [employer] Patient Information 2014 [employer].  

## 2013-09-08 NOTE — Progress Notes (Signed)
NST 08/27/13 reactive

## 2013-09-10 ENCOUNTER — Ambulatory Visit (INDEPENDENT_AMBULATORY_CARE_PROVIDER_SITE_OTHER): Payer: Self-pay | Admitting: General Practice

## 2013-09-10 VITALS — BP 102/60 | HR 89 | Wt 146.7 lb

## 2013-09-10 DIAGNOSIS — Z8759 Personal history of other complications of pregnancy, childbirth and the puerperium: Secondary | ICD-10-CM

## 2013-09-10 DIAGNOSIS — O09299 Supervision of pregnancy with other poor reproductive or obstetric history, unspecified trimester: Secondary | ICD-10-CM

## 2013-09-11 NOTE — Progress Notes (Signed)
6/4 NST reviewed and reactive 

## 2013-09-14 ENCOUNTER — Encounter: Payer: Self-pay | Admitting: Obstetrics and Gynecology

## 2013-09-14 ENCOUNTER — Ambulatory Visit (INDEPENDENT_AMBULATORY_CARE_PROVIDER_SITE_OTHER): Payer: Self-pay | Admitting: Obstetrics and Gynecology

## 2013-09-14 ENCOUNTER — Ambulatory Visit (HOSPITAL_COMMUNITY)
Admission: RE | Admit: 2013-09-14 | Discharge: 2013-09-14 | Disposition: A | Discharge status: Home / Self Care | Payer: Medicaid Other | Source: Ambulatory Visit | Attending: Obstetrics and Gynecology | Admitting: Obstetrics and Gynecology

## 2013-09-14 VITALS — BP 109/66 | HR 87 | Temp 98.3°F | Wt 147.4 lb

## 2013-09-14 DIAGNOSIS — O0990 Supervision of high risk pregnancy, unspecified, unspecified trimester: Secondary | ICD-10-CM

## 2013-09-14 DIAGNOSIS — O09299 Supervision of pregnancy with other poor reproductive or obstetric history, unspecified trimester: Secondary | ICD-10-CM | POA: Insufficient documentation

## 2013-09-14 DIAGNOSIS — Z8759 Personal history of other complications of pregnancy, childbirth and the puerperium: Secondary | ICD-10-CM

## 2013-09-14 DIAGNOSIS — Z3689 Encounter for other specified antenatal screening: Secondary | ICD-10-CM | POA: Insufficient documentation

## 2013-09-14 LAB — POCT URINALYSIS DIP (DEVICE)
GLUCOSE, UA: NEGATIVE mg/dL
Hgb urine dipstick: NEGATIVE
KETONES UR: NEGATIVE mg/dL
NITRITE: NEGATIVE
Protein, ur: NEGATIVE mg/dL
Specific Gravity, Urine: 1.025 (ref 1.005–1.030)
Urobilinogen, UA: 2 mg/dL — ABNORMAL HIGH (ref 0.0–1.0)
pH: 7 (ref 5.0–8.0)

## 2013-09-14 LAB — OB RESULTS CONSOLE GBS: GBS: NEGATIVE

## 2013-09-14 LAB — OB RESULTS CONSOLE GC/CHLAMYDIA
CHLAMYDIA, DNA PROBE: NEGATIVE
Gonorrhea: NEGATIVE

## 2013-09-14 NOTE — Progress Notes (Signed)
NST reviewed and reactive. AFI result pending. Continue 2x/wk FATs. Doing well. Good FM. Cultures sent.  j

## 2013-09-14 NOTE — Progress Notes (Signed)
Edema in feet. C/o of intermittent lower abdominal/pelvic pressure.

## 2013-09-14 NOTE — Patient Instructions (Signed)
Fetal Movement Counts Patient Name: __________________________________________________ Patient Due Date: ____________________ Performing a fetal movement count is highly recommended in high-risk pregnancies, but it is good for every pregnant woman to do. Your caregiver may ask you to start counting fetal movements at 28 weeks of the pregnancy. Fetal movements often increase:  After eating a full meal.  After physical activity.  After eating or drinking something sweet or cold.  At rest. Pay attention to when you feel the baby is most active. This will help you notice a pattern of your baby's sleep and wake cycles and what factors contribute to an increase in fetal movement. It is important to perform a fetal movement count at the same time each day when your baby is normally most active.  HOW TO COUNT FETAL MOVEMENTS 1. Find a quiet and comfortable area to sit or lie down on your left side. Lying on your left side provides the best blood and oxygen circulation to your baby. 2. Write down the day and time on a sheet of paper or in a journal. 3. Start counting kicks, flutters, swishes, rolls, or jabs in a 2 hour period. You should feel at least 10 movements within 2 hours. 4. If you do not feel 10 movements in 2 hours, wait 2 3 hours and count again. Look for a change in the pattern or not enough counts in 2 hours. SEEK MEDICAL CARE IF:  You feel less than 10 counts in 2 hours, tried twice.  There is no movement in over an hour.  The pattern is changing or taking longer each day to reach 10 counts in 2 hours.  You feel the baby is not moving as he or she usually does. Date: ____________ Movements: ____________ Start time: ____________ Finish time: ____________  Date: ____________ Movements: ____________ Start time: ____________ Finish time: ____________ Date: ____________ Movements: ____________ Start time: ____________ Finish time: ____________ Date: ____________ Movements: ____________  Start time: ____________ Finish time: ____________ Date: ____________ Movements: ____________ Start time: ____________ Finish time: ____________ Date: ____________ Movements: ____________ Start time: ____________ Finish time: ____________ Date: ____________ Movements: ____________ Start time: ____________ Finish time: ____________ Date: ____________ Movements: ____________ Start time: ____________ Finish time: ____________  Date: ____________ Movements: ____________ Start time: ____________ Finish time: ____________ Date: ____________ Movements: ____________ Start time: ____________ Finish time: ____________ Date: ____________ Movements: ____________ Start time: ____________ Finish time: ____________ Date: ____________ Movements: ____________ Start time: ____________ Finish time: ____________ Date: ____________ Movements: ____________ Start time: ____________ Finish time: ____________ Date: ____________ Movements: ____________ Start time: ____________ Finish time: ____________ Date: ____________ Movements: ____________ Start time: ____________ Finish time: ____________  Date: ____________ Movements: ____________ Start time: ____________ Finish time: ____________ Date: ____________ Movements: ____________ Start time: ____________ Finish time: ____________ Date: ____________ Movements: ____________ Start time: ____________ Finish time: ____________ Date: ____________ Movements: ____________ Start time: ____________ Finish time: ____________ Date: ____________ Movements: ____________ Start time: ____________ Finish time: ____________ Date: ____________ Movements: ____________ Start time: ____________ Finish time: ____________ Date: ____________ Movements: ____________ Start time: ____________ Finish time: ____________  Date: ____________ Movements: ____________ Start time: ____________ Finish time: ____________ Date: ____________ Movements: ____________ Start time: ____________ Finish time:  ____________ Date: ____________ Movements: ____________ Start time: ____________ Finish time: ____________ Date: ____________ Movements: ____________ Start time: ____________ Finish time: ____________ Date: ____________ Movements: ____________ Start time: ____________ Finish time: ____________ Date: ____________ Movements: ____________ Start time: ____________ Finish time: ____________ Date: ____________ Movements: ____________ Start time: ____________ Finish time: ____________  Date: ____________ Movements: ____________ Start time: ____________ Finish   time: ____________ Date: ____________ Movements: ____________ Start time: ____________ Finish time: ____________ Date: ____________ Movements: ____________ Start time: ____________ Finish time: ____________ Date: ____________ Movements: ____________ Start time: ____________ Finish time: ____________ Date: ____________ Movements: ____________ Start time: ____________ Finish time: ____________ Date: ____________ Movements: ____________ Start time: ____________ Finish time: ____________ Date: ____________ Movements: ____________ Start time: ____________ Finish time: ____________  Date: ____________ Movements: ____________ Start time: ____________ Finish time: ____________ Date: ____________ Movements: ____________ Start time: ____________ Finish time: ____________ Date: ____________ Movements: ____________ Start time: ____________ Finish time: ____________ Date: ____________ Movements: ____________ Start time: ____________ Finish time: ____________ Date: ____________ Movements: ____________ Start time: ____________ Finish time: ____________ Date: ____________ Movements: ____________ Start time: ____________ Finish time: ____________ Date: ____________ Movements: ____________ Start time: ____________ Finish time: ____________  Date: ____________ Movements: ____________ Start time: ____________ Finish time: ____________ Date: ____________ Movements:  ____________ Start time: ____________ Finish time: ____________ Date: ____________ Movements: ____________ Start time: ____________ Finish time: ____________ Date: ____________ Movements: ____________ Start time: ____________ Finish time: ____________ Date: ____________ Movements: ____________ Start time: ____________ Finish time: ____________ Date: ____________ Movements: ____________ Start time: ____________ Finish time: ____________ Date: ____________ Movements: ____________ Start time: ____________ Finish time: ____________  Date: ____________ Movements: ____________ Start time: ____________ Finish time: ____________ Date: ____________ Movements: ____________ Start time: ____________ Finish time: ____________ Date: ____________ Movements: ____________ Start time: ____________ Finish time: ____________ Date: ____________ Movements: ____________ Start time: ____________ Finish time: ____________ Date: ____________ Movements: ____________ Start time: ____________ Finish time: ____________ Date: ____________ Movements: ____________ Start time: ____________ Finish time: ____________ Document Released: 04/25/2006 Document Revised: 03/12/2012 Document Reviewed: 01/21/2012 [employer] Patient Information 2014 [employer].  

## 2013-09-15 LAB — GC/CHLAMYDIA PROBE AMP
CT PROBE, AMP APTIMA: NEGATIVE
GC PROBE AMP APTIMA: NEGATIVE

## 2013-09-16 LAB — CULTURE, BETA STREP (GROUP B ONLY)

## 2013-09-17 ENCOUNTER — Ambulatory Visit (INDEPENDENT_AMBULATORY_CARE_PROVIDER_SITE_OTHER): Payer: Self-pay | Admitting: *Deleted

## 2013-09-17 VITALS — BP 101/53 | HR 80

## 2013-09-17 DIAGNOSIS — Z8759 Personal history of other complications of pregnancy, childbirth and the puerperium: Secondary | ICD-10-CM

## 2013-09-17 DIAGNOSIS — O09299 Supervision of pregnancy with other poor reproductive or obstetric history, unspecified trimester: Secondary | ICD-10-CM

## 2013-09-17 DIAGNOSIS — O0990 Supervision of high risk pregnancy, unspecified, unspecified trimester: Secondary | ICD-10-CM

## 2013-09-17 NOTE — Progress Notes (Signed)
NST reactive on 09/17/13 

## 2013-09-21 ENCOUNTER — Ambulatory Visit (INDEPENDENT_AMBULATORY_CARE_PROVIDER_SITE_OTHER): Payer: Self-pay | Admitting: Obstetrics & Gynecology

## 2013-09-21 VITALS — BP 103/66 | HR 75 | Wt 146.6 lb

## 2013-09-21 DIAGNOSIS — O09299 Supervision of pregnancy with other poor reproductive or obstetric history, unspecified trimester: Secondary | ICD-10-CM

## 2013-09-21 DIAGNOSIS — Z8759 Personal history of other complications of pregnancy, childbirth and the puerperium: Secondary | ICD-10-CM

## 2013-09-21 LAB — POCT URINALYSIS DIP (DEVICE)
BILIRUBIN URINE: NEGATIVE
GLUCOSE, UA: NEGATIVE mg/dL
Hgb urine dipstick: NEGATIVE
KETONES UR: NEGATIVE mg/dL
LEUKOCYTES UA: NEGATIVE
Nitrite: NEGATIVE
Protein, ur: NEGATIVE mg/dL
SPECIFIC GRAVITY, URINE: 1.02 (ref 1.005–1.030)
Urobilinogen, UA: 0.2 mg/dL (ref 0.0–1.0)
pH: 7 (ref 5.0–8.0)

## 2013-09-21 LAB — US OB FOLLOW UP

## 2013-09-21 NOTE — Patient Instructions (Signed)
Inducción del trabajo de parto   (Labor Induction )  Se denomina inducción del trabajo de parto cuando se inician acciones para hacer que una mujer embarazada comience el trabajo de parto. La mayoría de las mujeres comienzan el trabajo de parto sin ayuda entre las semanas 37 y 42 del embarazo. Cuando esto no ocurre o cuando hay una necesidad médica, pueden utilizarse diferentes métodos para inducirlo. La inducción del trabajo de parto hace que el útero se contraiga. También hace que el cuello del útero se ablandemadure), se abra (se dilate), y se afine (se borre). Generalmente el trabajo de parto no se induce antes de las 39 semanas excepto que haya un problema con el bebé o con la madre.   Antes de inducir el trabajo de parto, el médico considerará cierto número de factores incluyendo los siguientes:  · El estado del bebé.  · Cuántas semanas tiene de embarazo.  · La madurez de los pulmones del bebé.  · El estado del cuello del útero.  · La posición del bebé.  ¿CUÁLES SON LOS MOTIVOS PARA INDUCIR UN PARTO?  El trabajo de parto puede inducirse por las siguientes razones:  · La salud del bebé o de la madre están en riesgo.  · El embarazo se ha pasado de término en 1 semana o más.  · Ha roto la bolsa de aguas pero no se ha iniciado el trabajo de parto por sí mismo.  · La madre tiene algún trastorno de salud o una enfermedad grave, como hipertensión arterial, una infección, desprendimiento abrupto de la placenta o diabetes.  · Hay escaso líquido amniótico alrededor del bebé.  · El bebé presenta sufrimiento.  La conveniencia o el deseo de que el bebé nazca en una cierta fecha no es un motivo para inducir el parto.  ¿CUÁLES SON LOS MÉTODOS UTILIZADOS PARA INDUCIR EL TRABAJO DE PARTO?  Algunos métodos de inducción del trabajo de parto son:   · Administración del medicamentos prostaglandina. Este medicamento hace que el cuello uterino se dilate y madure. Este medicamento también iniciará las contracciones. Puede tomarse por  boca o insertarse en la vagina en forma de supositorio.  · Inserción en la vagina de un tubo delgado (catéter) con un balón en el extremo para dilatar el cuello del útero. Una vez insertado, el balón se infla con agua, lo que provoca la apertura del cuello del útero.  · Ruptura de las membranas. El médico separa el saco amniótico del cuello uterino, haciendo que el cuello uterino se distienda y cause la liberación de la hormona llamada progesterona. Esto hace que el útero se contraiga. Este procedimiento se realiza durante una visita al consultorio médico. Le indicarán que vuelva a su casa y espere que se inicien las contracciones. Luego tendrá que volver para la inducción.  · Ruptura de la bolsa de aguas. El médico romperá el saco amniótico con un pequeño instrumento. Una vez que el saco amniótico se rompe, las contracciones deben comenzar. Pueden pasar algunas horas hasta que haga efecto.  · Medicamentos que desencadenen o intensifiquen las contracciones. Se lo administrarán a través de un catéter por vía intravenosa (IV) que se inserta en una de las venas del brazo.  Todos los métodos de inducción, excepto la ruptura de membranas, se realizan en el hospital. La inducción se realizará en el hospital, de modo que usted y el bebé puedan ser controlados cuidadosamente.   ¿CUÁNTO TIEMPO LLEVA INDUCIR EL TRABAJO DE PARTO?  Algunas inducciones pueden demorar entre 2 y   3 días. Generalmente lleva menos tiempo, dependiendo del estado del cuello del útero. Puede tomar más tiempo si la inducción se realiza en etapas tempranas del embarazo o es su primer embarazo. Si han pasado 2 o 3 días y no se inicia el trabajo de parto, podrán enviarla a su casa o realizar una cesárea.  ¿CUÁLES SON LOS RIESGOS ASOCIADOS CON LA INDUCCiÓN DEL TRABAJO DE PARTO?  Algunos de los riesgos de la inducción son:   · Cambios en la frecuencia cardíaca fetal, por ejemplo los latidos son demasiado rápidos, o lentos, o erráticos.  · Riesgo de distrés  fetal.  · Posibilidad de infección en la madre o el bebé.  · Aumento de la posibilidad de que sea necesaria una cesárea.  · Ruptura (abrupción) de la placenta del útero (raro).  · Ruptura uterina (muy raro).  Cuando es necesario realizar la inducción por razones médicas, los beneficios deben superar a los riesgos.  ¿CUÁLES SON ALGUNAS RAZONES PARA NO INDUCIR EL TRABAJO DE PARTO?  La inducción no debe realizarse si:   · Se demuestra que el bebé no tolera el trabajo de parto.  · Fue sometida anteriormente a cirugías en el útero, como una miomectomía o le han extirpado fibromas.  · La placenta está en una posición muy baja en el útero y obstruye la abertura del cuello (placenta previa).  · El bebé no está ubicado con la cabeza hacia bajo.  · El cordón umbilical cae hacia el canal de parto, adelante del bebé. Esto puede cortar el suministro de sangre y oxígeno al bebé.  · Fue sometida a una cesárea anteriormente.  · Hay circunstancias poco habituales, como que el bebé es extremadamente prematuro.  Document Released: 07/03/2007 Document Revised: 11/26/2012  [employer]® Patient Information ©2014 [employer].

## 2013-09-21 NOTE — Progress Notes (Signed)
NO c/o pain or UC, NST is reactive, irregular UC noted.

## 2013-09-24 ENCOUNTER — Ambulatory Visit (INDEPENDENT_AMBULATORY_CARE_PROVIDER_SITE_OTHER): Payer: Self-pay | Admitting: *Deleted

## 2013-09-24 VITALS — BP 106/62 | HR 84

## 2013-09-24 DIAGNOSIS — O09299 Supervision of pregnancy with other poor reproductive or obstetric history, unspecified trimester: Secondary | ICD-10-CM

## 2013-09-24 DIAGNOSIS — Z8759 Personal history of other complications of pregnancy, childbirth and the puerperium: Secondary | ICD-10-CM

## 2013-09-28 ENCOUNTER — Ambulatory Visit (INDEPENDENT_AMBULATORY_CARE_PROVIDER_SITE_OTHER): Payer: Self-pay | Admitting: Obstetrics & Gynecology

## 2013-09-28 VITALS — BP 113/76 | HR 84 | Wt 148.1 lb

## 2013-09-28 DIAGNOSIS — O09299 Supervision of pregnancy with other poor reproductive or obstetric history, unspecified trimester: Secondary | ICD-10-CM

## 2013-09-28 DIAGNOSIS — Z8759 Personal history of other complications of pregnancy, childbirth and the puerperium: Secondary | ICD-10-CM

## 2013-09-28 DIAGNOSIS — O0990 Supervision of high risk pregnancy, unspecified, unspecified trimester: Secondary | ICD-10-CM

## 2013-09-28 LAB — POCT URINALYSIS DIP (DEVICE)
BILIRUBIN URINE: NEGATIVE
GLUCOSE, UA: NEGATIVE mg/dL
HGB URINE DIPSTICK: NEGATIVE
KETONES UR: NEGATIVE mg/dL
Nitrite: NEGATIVE
Protein, ur: 30 mg/dL — AB
SPECIFIC GRAVITY, URINE: 1.02 (ref 1.005–1.030)
Urobilinogen, UA: 0.2 mg/dL (ref 0.0–1.0)
pH: 7 (ref 5.0–8.0)

## 2013-09-28 LAB — US OB FOLLOW UP

## 2013-09-28 NOTE — Progress Notes (Signed)
Pain all day yesterday  Leaking when using toilet. Mucus noted on exam, no fern. Labor precautions given

## 2013-09-28 NOTE — Patient Instructions (Signed)
Tercer trimestre de embarazo (Third Trimester of Pregnancy) El tercer trimestre va desde la semana29 hasta la 42, desde el sptimo hasta el noveno mes, y es la poca en la que el feto crece ms rpidamente. Hacia el final del noveno mes, el feto mide alrededor de 20pulgadas (45cm) de largo y pesa entre 6 y 10 libras (2,700 y 4,500kg).  CAMBIOS EN EL ORGANISMO Su organismo atraviesa por muchos cambios durante el embarazo, y estos varan de una mujer a otra.   Seguir aumentando de peso. Es de esperar que aumente entre 25 y 35libras (11 y 16kg) hacia el final del embarazo.  Podrn aparecer las primeras estras en las caderas, el abdomen y las mamas.  Puede tener necesidad de orinar con ms frecuencia porque el feto baja hacia la pelvis y ejerce presin sobre la vejiga.  Debido al embarazo podr sentir acidez estomacal con frecuencia.  Puede estar estreida, ya que ciertas hormonas enlentecen los movimientos de los msculos que empujan los desechos a travs de los intestinos.  Pueden aparecer hemorroides o abultarse e hincharse las venas (venas varicosas).  Puede sentir dolor plvico debido al aumento de peso y a que las hormonas del embarazo relajan las articulaciones entre los huesos de la pelvis. El dolor de espalda puede ser consecuencia de la sobrecarga de los msculos que soportan la postura.  Tal vez haya cambios en el cabello que pueden incluir su engrosamiento, crecimiento rpido y cambios en la textura. Adems, a algunas mujeres se les cae el cabello durante o despus del embarazo, o tienen el cabello seco o fino. Lo ms probable es que el cabello se le normalice despus del nacimiento del beb.  Las mamas seguirn creciendo y le dolern. A veces, puede haber una secrecin amarilla de las mamas llamada calostro.  El ombligo puede salir hacia afuera.  Puede sentir que le falta el aire debido a que se expande el tero.  Puede notar que el feto "baja" o lo siente ms bajo, en el  abdomen.  Puede tener una prdida de secrecin mucosa con sangre. Esto suele ocurrir en el trmino de unos pocos das a una semana antes de que comience el trabajo de parto.  El cuello del tero se vuelve delgado y blando (se borra) cerca de la fecha de parto. QU DEBE ESPERAR EN LOS EXMENES PRENATALES  Le harn exmenes prenatales cada 2semanas hasta la semana36. A partir de ese momento le harn exmenes semanales. Durante una visita prenatal de rutina:  La pesarn para asegurarse de que usted y el feto estn creciendo normalmente.  Le tomarn la presin arterial.  Le medirn el abdomen para controlar el desarrollo del beb.  Se escucharn los latidos cardacos fetales.  Se evaluarn los resultados de los estudios solicitados en visitas anteriores.  Le revisarn el cuello del tero cuando est prxima la fecha de parto para controlar si este se ha borrado. Alrededor de la semana36, el mdico le revisar el cuello del tero. Al mismo tiempo, realizar un anlisis de las secreciones del tejido vaginal. Este examen es para determinar si hay un tipo de bacteria, estreptococo Grupo B. El mdico le explicar esto con ms detalle. El mdico puede preguntarle lo siguiente:  Cmo le gustara que fuera el parto.  Cmo se siente.  Si siente los movimientos del beb.  Si ha tenido sntomas anormales, como prdida de lquido, sangrado, dolores de cabeza intensos o clicos abdominales.  Si tiene alguna pregunta. Otros exmenes o estudios de deteccin que pueden realizarse   durante el tercer trimestre incluyen lo siguiente:  Anlisis de sangre para controlar las concentraciones de hierro (anemia).  Controles fetales para determinar su salud, nivel de actividad y crecimiento. Si tiene alguna enfermedad o hay problemas durante el embarazo, le harn estudios. FALSO TRABAJO DE PARTO Es posible que sienta contracciones leves e irregulares que finalmente desaparecen. Se llaman contracciones de  Braxton Hicks o falso trabajo de parto. Las contracciones pueden durar horas, das o incluso semanas, antes de que el verdadero trabajo de parto se inicie. Si las contracciones ocurren a intervalos regulares, se intensifican o se hacen dolorosas, lo mejor es que la revise el mdico.  SIGNOS DE TRABAJO DE PARTO   Clicos de tipo menstrual.  Contracciones cada 5minutos o menos.  Contracciones que comienzan en la parte superior del tero y se extienden hacia abajo, a la zona inferior del abdomen y la espalda.  Sensacin de mayor presin en la pelvis o dolor de espalda.  Una secrecin de mucosidad acuosa o con sangre que sale de la vagina. Si tiene alguno de estos signos antes de la semana37 del embarazo, llame a su mdico de inmediato. Debe concurrir al hospital para que la controlen inmediatamente. INSTRUCCIONES PARA EL CUIDADO EN EL HOGAR   Evite fumar, consumir hierbas, beber alcohol y tomar frmacos que no le hayan recetado. Estas sustancias qumicas afectan la formacin y el desarrollo del beb.  Siga las indicaciones del mdico en relacin con el uso de medicamentos. Durante el embarazo, hay medicamentos que son seguros de tomar y otros que no.  Haga actividad fsica solo en la forma indicada por el mdico. Sentir clicos uterinos es un buen signo para detener la actividad fsica.  Contine comiendo alimentos que sanos con regularidad.  Use un sostn que le brinde buen soporte si le duelen las mamas.  No se d baos de inmersin en agua caliente, baos turcos ni saunas.  Colquese el cinturn de seguridad cuando conduzca.  No coma carne cruda ni queso sin cocinar; evite el contacto con las bandejas sanitarias de los gatos y la tierra que estos animales usan. Estos elementos contienen grmenes que pueden causar defectos congnitos en el beb.  Tome las vitaminas prenatales.  Si est estreida, pruebe un laxante suave (si el mdico lo autoriza). Consuma ms alimentos ricos en  fibra, como vegetales y frutas frescos y cereales integrales. Beba gran cantidad de lquido para mantener la orina de tono claro o color amarillo plido.  Dese baos de asiento con agua tibia para aliviar el dolor o las molestias causadas por las hemorroides. Use una crema para las hemorroides si el mdico la autoriza.  Si tiene venas varicosas, use medias de descanso. Eleve los pies durante 15minutos, 3 o 4veces por da. Limite la cantidad de sal en su dieta.  Evite levantar objetos pesados, use zapatos de tacones bajos y mantenga una buena postura.  Descanse con las piernas elevadas si tiene calambres o dolor de cintura.  Visite a su dentista si no lo ha hecho durante el embarazo. Use un cepillo de dientes blando para higienizarse los dientes y psese el hilo dental con suavidad.  Puede seguir manteniendo relaciones sexuales, a menos que el mdico le indique lo contrario.  No haga viajes largos excepto que sea absolutamente necesario y solo con la autorizacin del mdico.  Tome clases prenatales para entender, practicar y hacer preguntas sobre el trabajo de parto y el parto.  Haga un ensayo de la partida al hospital.  Prepare el bolso que   llevar al hospital.  Prepare la habitacin del beb.  Concurra a todas las visitas prenatales segn las indicaciones de su mdico. SOLICITE ATENCIN MDICA SI:  No est segura de que est en trabajo de parto o de que ha roto la bolsa de las aguas.  Tiene mareos.  Siente clicos leves, presin en la pelvis o dolor persistente en el abdomen.  Tiene nuseas, vmitos o diarrea persistentes.  Tiene secrecin vaginal con mal olor.  Siente dolor al orinar. SOLICITE ATENCIN MDICA DE INMEDIATO SI:   Tiene fiebre.  Tiene una prdida de lquido por la vagina.  Tiene sangrado o pequeas prdidas vaginales.  Siente dolor intenso o clicos en el abdomen.  Sube o baja de peso rpidamente.  Tiene dificultad para respirar y siente dolor de  pecho.  Sbitamente se le hinchan mucho el rostro, las manos, los tobillos, los pies o las piernas.  No ha sentido los movimientos del beb durante una hora.  Siente un dolor de cabeza intenso que no se alivia con medicamentos.  Hay cambios en la visin. Document Released: 01/03/2005 Document Revised: 03/31/2013 [employer] Patient Information 2015 [employer]. This information is not intended to replace advice given to you by your health care provider. Make sure you discuss any questions you have with your health care provider.  

## 2013-09-28 NOTE — Progress Notes (Signed)
IOL scheduled on 7/1 @ 0630

## 2013-09-28 NOTE — Progress Notes (Signed)
Reports intermittent lower abdominal/pelvic pain/pressure.

## 2013-09-30 ENCOUNTER — Telehealth (HOSPITAL_COMMUNITY): Payer: Self-pay | Admitting: *Deleted

## 2013-09-30 ENCOUNTER — Encounter (HOSPITAL_COMMUNITY): Payer: Self-pay | Admitting: *Deleted

## 2013-09-30 NOTE — Telephone Encounter (Signed)
Preadmission screen interpreter [phone]

## 2013-10-01 ENCOUNTER — Ambulatory Visit (INDEPENDENT_AMBULATORY_CARE_PROVIDER_SITE_OTHER): Payer: Self-pay | Admitting: *Deleted

## 2013-10-01 VITALS — BP 108/60 | HR 81

## 2013-10-01 DIAGNOSIS — Z8759 Personal history of other complications of pregnancy, childbirth and the puerperium: Secondary | ICD-10-CM

## 2013-10-01 DIAGNOSIS — O09299 Supervision of pregnancy with other poor reproductive or obstetric history, unspecified trimester: Secondary | ICD-10-CM

## 2013-10-05 ENCOUNTER — Encounter: Payer: Self-pay | Admitting: Obstetrics and Gynecology

## 2013-10-05 ENCOUNTER — Ambulatory Visit (INDEPENDENT_AMBULATORY_CARE_PROVIDER_SITE_OTHER): Payer: Self-pay | Admitting: Obstetrics and Gynecology

## 2013-10-05 VITALS — BP 110/75 | HR 64 | Temp 97.1°F | Wt 152.5 lb

## 2013-10-05 DIAGNOSIS — O0990 Supervision of high risk pregnancy, unspecified, unspecified trimester: Secondary | ICD-10-CM

## 2013-10-05 DIAGNOSIS — Z8759 Personal history of other complications of pregnancy, childbirth and the puerperium: Secondary | ICD-10-CM

## 2013-10-05 DIAGNOSIS — O09299 Supervision of pregnancy with other poor reproductive or obstetric history, unspecified trimester: Secondary | ICD-10-CM

## 2013-10-05 LAB — POCT URINALYSIS DIP (DEVICE)
BILIRUBIN URINE: NEGATIVE
GLUCOSE, UA: NEGATIVE mg/dL
HGB URINE DIPSTICK: NEGATIVE
Ketones, ur: NEGATIVE mg/dL
Leukocytes, UA: NEGATIVE
Nitrite: NEGATIVE
Protein, ur: NEGATIVE mg/dL
SPECIFIC GRAVITY, URINE: 1.015 (ref 1.005–1.030)
Urobilinogen, UA: 0.2 mg/dL (ref 0.0–1.0)
pH: 7 (ref 5.0–8.0)

## 2013-10-05 LAB — US OB FOLLOW UP

## 2013-10-05 NOTE — Progress Notes (Signed)
IOL scheduled on 7/1.

## 2013-10-05 NOTE — Progress Notes (Signed)
Patient is doing well without complaints. FM/labor precautions reviewed. Patient scheduled for IOL on 7/1 NST reviewed and reactive

## 2013-10-07 ENCOUNTER — Inpatient Hospital Stay (HOSPITAL_COMMUNITY)
Admission: RE | Admit: 2013-10-07 | Discharge: 2013-10-09 | DRG: 775 | Disposition: A | Discharge status: Home / Self Care | Payer: Medicaid Other | Source: Ambulatory Visit | Attending: Family Medicine | Admitting: Family Medicine

## 2013-10-07 ENCOUNTER — Encounter (HOSPITAL_COMMUNITY): Payer: Self-pay

## 2013-10-07 VITALS — BP 96/58 | HR 73 | Temp 98.2°F | Resp 18 | Ht 60.0 in | Wt 152.0 lb

## 2013-10-07 DIAGNOSIS — O99891 Other specified diseases and conditions complicating pregnancy: Secondary | ICD-10-CM | POA: Diagnosis present

## 2013-10-07 DIAGNOSIS — O09299 Supervision of pregnancy with other poor reproductive or obstetric history, unspecified trimester: Secondary | ICD-10-CM

## 2013-10-07 DIAGNOSIS — O0990 Supervision of high risk pregnancy, unspecified, unspecified trimester: Secondary | ICD-10-CM

## 2013-10-07 DIAGNOSIS — Z8759 Personal history of other complications of pregnancy, childbirth and the puerperium: Secondary | ICD-10-CM

## 2013-10-07 DIAGNOSIS — Z3490 Encounter for supervision of normal pregnancy, unspecified, unspecified trimester: Secondary | ICD-10-CM

## 2013-10-07 HISTORY — DX: Other specified health status: Z78.9

## 2013-10-07 LAB — CBC
HCT: 34 % — ABNORMAL LOW (ref 36.0–46.0)
HEMOGLOBIN: 11.1 g/dL — AB (ref 12.0–15.0)
MCH: 26.9 pg (ref 26.0–34.0)
MCHC: 32.6 g/dL (ref 30.0–36.0)
MCV: 82.5 fL (ref 78.0–100.0)
Platelets: 261 10*3/uL (ref 150–400)
RBC: 4.12 MIL/uL (ref 3.87–5.11)
RDW: 14.8 % (ref 11.5–15.5)
WBC: 7.9 10*3/uL (ref 4.0–10.5)

## 2013-10-07 LAB — RAPID HIV SCREEN (WH-MAU): Rapid HIV Screen: NONREACTIVE

## 2013-10-07 LAB — RPR

## 2013-10-07 LAB — TYPE AND SCREEN
ABO/RH(D): O POS
Antibody Screen: NEGATIVE

## 2013-10-07 LAB — ABO/RH: ABO/RH(D): O POS

## 2013-10-07 MED ORDER — EPHEDRINE 5 MG/ML INJ
10.0000 mg | INTRAVENOUS | Status: DC | PRN
  Filled 2013-10-07: qty 2

## 2013-10-07 MED ORDER — PHENYLEPHRINE 40 MCG/ML (10ML) SYRINGE FOR IV PUSH (FOR BLOOD PRESSURE SUPPORT)
80.0000 ug | PREFILLED_SYRINGE | INTRAVENOUS | Status: DC | PRN
  Filled 2013-10-07: qty 2

## 2013-10-07 MED ORDER — IBUPROFEN 600 MG PO TABS
600.0000 mg | ORAL_TABLET | Freq: Four times a day (QID) | ORAL | Status: DC | PRN
  Administered 2013-10-08: 600 mg via ORAL
  Filled 2013-10-07 (×2): qty 1

## 2013-10-07 MED ORDER — ACETAMINOPHEN 325 MG PO TABS: 650.0000 mg | ORAL_TABLET | ORAL | Status: DC | PRN

## 2013-10-07 MED ORDER — OXYTOCIN BOLUS FROM INFUSION
500.0000 mL | INTRAVENOUS | Status: DC
  Administered 2013-10-08: 500 mL via INTRAVENOUS

## 2013-10-07 MED ORDER — TERBUTALINE SULFATE 1 MG/ML IJ SOLN: 0.2500 mg | Freq: Once | INTRAMUSCULAR | Status: DC | PRN

## 2013-10-07 MED ORDER — LACTATED RINGERS IV SOLN
500.0000 mL | INTRAVENOUS | Status: DC | PRN
  Administered 2013-10-07: 500 mL via INTRAVENOUS

## 2013-10-07 MED ORDER — FLEET ENEMA 7-19 GM/118ML RE ENEM: 1.0000 | ENEMA | RECTAL | Status: DC | PRN

## 2013-10-07 MED ORDER — MISOPROSTOL 25 MCG QUARTER TABLET
25.0000 ug | ORAL_TABLET | ORAL | Status: DC | PRN
  Administered 2013-10-07: 25 ug via VAGINAL
  Filled 2013-10-07: qty 0.25
  Filled 2013-10-07: qty 1

## 2013-10-07 MED ORDER — CITRIC ACID-SODIUM CITRATE 334-500 MG/5ML PO SOLN
30.0000 mL | ORAL | Status: DC | PRN
  Filled 2013-10-07 (×2): qty 15

## 2013-10-07 MED ORDER — LACTATED RINGERS IV SOLN: 500.0000 mL | Freq: Once | INTRAVENOUS | Status: DC

## 2013-10-07 MED ORDER — OXYTOCIN 40 UNITS IN LACTATED RINGERS INFUSION - SIMPLE MED
1.0000 m[IU]/min | INTRAVENOUS | Status: DC
  Administered 2013-10-07: 2 m[IU]/min via INTRAVENOUS
  Filled 2013-10-07: qty 1000

## 2013-10-07 MED ORDER — TERBUTALINE SULFATE 1 MG/ML IJ SOLN
0.2500 mg | Freq: Once | INTRAMUSCULAR | Status: AC | PRN
  Filled 2013-10-07: qty 1

## 2013-10-07 MED ORDER — FENTANYL 2.5 MCG/ML BUPIVACAINE 1/10 % EPIDURAL INFUSION (WH - ANES): 14.0000 mL/h | INTRAMUSCULAR | Status: DC | PRN

## 2013-10-07 MED ORDER — DIPHENHYDRAMINE HCL 50 MG/ML IJ SOLN: 12.5000 mg | INTRAMUSCULAR | Status: DC | PRN

## 2013-10-07 MED ORDER — LACTATED RINGERS IV SOLN
INTRAVENOUS | Status: DC
  Administered 2013-10-07 (×3): via INTRAVENOUS

## 2013-10-07 MED ORDER — ONDANSETRON HCL 4 MG/2ML IJ SOLN: 4.0000 mg | Freq: Four times a day (QID) | INTRAMUSCULAR | Status: DC | PRN

## 2013-10-07 MED ORDER — LIDOCAINE HCL (PF) 1 % IJ SOLN
30.0000 mL | INTRAMUSCULAR | Status: DC | PRN
  Administered 2013-10-08: 30 mL via SUBCUTANEOUS
  Filled 2013-10-07 (×2): qty 30

## 2013-10-07 MED ORDER — FENTANYL CITRATE 0.05 MG/ML IJ SOLN
100.0000 ug | INTRAMUSCULAR | Status: DC | PRN
  Administered 2013-10-07: 100 ug via INTRAVENOUS
  Filled 2013-10-07: qty 2

## 2013-10-07 MED ORDER — OXYTOCIN 40 UNITS IN LACTATED RINGERS INFUSION - SIMPLE MED: 62.5000 mL/h | INTRAVENOUS | Status: DC

## 2013-10-07 MED ORDER — OXYCODONE-ACETAMINOPHEN 5-325 MG PO TABS
1.0000 | ORAL_TABLET | ORAL | Status: DC | PRN
  Administered 2013-10-08: 2 via ORAL
  Filled 2013-10-07: qty 2

## 2013-10-07 NOTE — Progress Notes (Signed)
[name] is a 33 y.o. [phone] at 652w0d admitted for induction of labor due to prior IUFD.  Subjective: Pt no complaints at this time. Feeling ctx  Objective: BP 105/93  Pulse 74  Temp(Src) 98.4 F (36.9 C)  Resp 18  Ht 5' (1.524 m)  Wt 68.947 kg (152 lb)  BMI 29.69 kg/m2  LMP 01/07/2013      FHT:  FHR: 130s bpm, variability: moderate,  accelerations:  Present,  decelerations:  Present occassional variabl UC:   regular, every 3 minutes SVE:   Dilation: 3 Effacement (%): 60 Station: -2 Exam by:: lee  Labs: Lab Results  Component Value Date   WBC 7.9 10/07/2013   HGB 11.1* 10/07/2013   HCT 34.0* 10/07/2013   MCV 82.5 10/07/2013   PLT 261 10/07/2013    Assessment / Plan: Induction of labor due to Hx of IUFD,  progressing well after cytotec  Labor: Progressing normally and will augment with Pit as needed Preeclampsia:  no signs or symptoms of toxicity Fetal Wellbeing:  Category II Pain Control:  Labor support without medications I/D:  GBS neg Anticipated MOD:  NSVD  [name] 10/07/2013, 12:52 PM

## 2013-10-07 NOTE — Progress Notes (Signed)
I spoke with pt's RN after hearing report that she has a history of an IUFD.  Given that patient has not mentioned it today and does not seem emotional at this time, we decided that timing would be better to visit her tomorrow after delivery.  Please page though if emotional or spiritual needs arise earlier.  [address]Chaplain Katy Black [place] Pager, [phone] 3:42 PM   10/07/13 1500  Clinical Encounter Type  Visited With Health care provider

## 2013-10-07 NOTE — H&P (Signed)
LABOR ADMISSION HISTORY AND PHYSICAL  [name] is a 33 y.o. female [phone] with IUP at 5229w0d presenting for IOL for hx of still birth with first pregnancy.  Has been doing well. No complications this pregnancy.  No lof, vb. +FM.    [facility] Has been in twice weekly testing without complications.  GBS neg.   Prenatal History/Complications:  Past Medical History: Past Medical History  Diagnosis Date  . Medical history non-contributory     Past Surgical History: Past Surgical History  Procedure Laterality Date  . Laparoscopic appendectomy  04/05/2012    Procedure: APPENDECTOMY LAPAROSCOPIC;  Surgeon: Lodema PilotBrian Layton, DO;  Location: [facility];  Service: General;  Laterality: N/A;  . Appendectomy      Obstetrical History: OB History   Grav Para Term Preterm Abortions TAB SAB Ect Mult Living   5 4 2 2  0 0 0 0 0 3    G1- 36 week, Still born 162- preterm 36week G3- term, NSVD G4- term, NSVD 7#5oz G5- current   Social History: History   Social History  . Marital Status: Single    Spouse Name: N/A    Number of Children: N/A  . Years of Education: N/A   Social History Main Topics  . Smoking status: Never Smoker   . Smokeless tobacco: Never Used  . Alcohol Use: No  . Drug Use: No  . Sexual Activity: Yes   Other Topics Concern  . None   Social History Narrative  . None    Family History: History reviewed. No pertinent family history.  Allergies: No Known Allergies  Prescriptions prior to admission  Medication Sig Dispense Refill  . prenatal vitamin w/FE, FA (PRENATAL 1 + 1) 27-1 MG TABS tablet Take 1 tablet by mouth daily at 12 noon.         Review of Systems   All systems reviewed and negative except as stated in HPI  Blood pressure 120/70, pulse 78, temperature 98.4 F (36.9 C), resp. rate 16, height 5' (1.524 m), weight 68.947 kg (152 lb), last menstrual period 01/07/2013. General appearance: alert,  cooperative and no distress Lungs: clear to auscultation bilaterally Heart: regular rate and rhythm Abdomen: soft, non-tender; bowel sounds normal Extremities: Homans sign is negative, no sign of DVT  Presentation: cephalic Fetal monitoringBaseline: 140 bpm, Variability: Good {> 6 bpm), Accelerations: Reactive and Decelerations: rare variable Uterine activity irregular    Dilation: 1.5 Effacement (%): 50 Station: -2 Exam by:: [name]   Prenatal labs: ABO, Rh: --/--/O POS (07/01 0740) Antibody: NEG (07/01 0740) Rubella:   RPR: Nonreactive (04/20 0000)  HBsAg: Negative (02/09 0000)  HIV: Non-reactive (02/09 0000)  GBS: Negative (06/08 0000)     Prenatal Transfer Tool  Maternal Diabetes: No Genetic Screening: Normal Maternal Ultrasounds/Referrals: Normal Fetal Ultrasounds or other Referrals:  None Maternal Substance Abuse:  No Significant Maternal Medications:  None Significant Maternal Lab Results: None     Results for orders placed during the hospital encounter of 10/07/13 (from the past 24 hour(s))  CBC   Collection Time    10/07/13  7:40 AM      Result Value Ref Range   WBC 7.9  4.0 - 10.5 K/uL   RBC 4.12  3.87 - 5.11 MIL/uL   Hemoglobin 11.1 (*) 12.0 - 15.0 g/dL   HCT 45.434.0 (*) 09.836.0 - 11.946.0 %   MCV 82.5  78.0 - 100.0 fL   MCH 26.9  26.0 - 34.0 pg   MCHC 32.6  30.0 - 36.0 g/dL   RDW 16.114.8  09.611.5 - 04.515.5 %   Platelets 261  150 - 400 K/uL  RAPID HIV SCREEN [facility])   Collection Time    10/07/13  7:40 AM      Result Value Ref Range   SUDS Rapid HIV Screen NON REACTIVE  NON REACTIVE  TYPE AND SCREEN   Collection Time    10/07/13  7:40 AM      Result Value Ref Range   ABO/RH(D) O POS     Antibody Screen NEG     Sample Expiration 10/10/2013      Assessment: [name] is a 33 y.o. [id number] at 3518w0d here for IOL for hx of still birth   #Labor: firm cervix on exam. Will start with cytotec for ripening and plan for FB and pit after #Pain: Prn meds as  needed. Currently nothing requested  #FWB: Cat I tracing. EFW 7.5# on leopolds #ID:  GBS neg #MOF: neg #MOC: undecided #Circ:  no  [name] L 10/07/2013, 8:54 AM

## 2013-10-07 NOTE — H&P (Signed)
Attestation of Attending Supervision of Advanced Practitioner (PA/CNM/NP): Evaluation and management procedures were performed by the Advanced Practitioner under my supervision and collaboration.  I have reviewed the Advanced Practitioner's note and chart, and I agree with the management and plan.  Reva BoresPRATT,Evolette Pendell S, MD [facility] Attending 10/07/2013 9:24 AM

## 2013-10-07 NOTE — Progress Notes (Signed)
Interpreter requested 

## 2013-10-07 NOTE — Progress Notes (Signed)
Erich Montaneulalia Melo Serita GritDomingo is a 33 y.o. [phone] at 7566w0d admitted for induction of labor due to prior IUFD.  Subjective: Called to room for recurrent variable decels. Pt comofortable without complaints  Objective: BP 93/43  Pulse 73  Temp(Src) 98.3 F (36.8 C) (Oral)  Resp 18  Ht 5' (1.524 m)  Wt 68.947 kg (152 lb)  BMI 29.69 kg/m2  LMP 01/07/2013      FHT:  FHR: 140s bpm, variability: moderate,  accelerations:  Present,  decelerations:  Recurrent variable UC:   regular, every 1-2 minutes SVE:   Dilation: 3 Effacement (%): 60 Station: -2 Exam by:: lee  Labs: Lab Results  Component Value Date   WBC 7.9 10/07/2013   HGB 11.1* 10/07/2013   HCT 34.0* 10/07/2013   MCV 82.5 10/07/2013   PLT 261 10/07/2013    Assessment / Plan: Induction of labor due to Hx of IUFD,  Became tachsystolic - d/c'd pit, fluid bolus and reposition. Ctx spaced out with recovery of variable decels.  Labor: no cervical change, will allow baby recover for 30min and place FB Preeclampsia:  no signs or symptoms of toxicity Fetal Wellbeing:  Category II Pain Control:  Labor support without medications I/D:  GBS neg Anticipated MOD:  NSVD  Spiro Ausborn RYAN 10/07/2013, 4:27 PM

## 2013-10-07 NOTE — Progress Notes (Addendum)
[name] is a 33 y.o. [phone] at 6675w0d admitted for induction of labor due to prior IUFD.  Subjective: Feeling some more pressure and constant sensation of fluid leaking   Objective: BP 114/73  Pulse 80  Temp(Src) 98.4 F (36.9 C) (Oral)  Resp 18  Ht 5' (1.524 m)  Wt 68.947 kg (152 lb)  BMI 29.69 kg/m2  LMP 01/07/2013     FHT:  FHR: 140s bpm, variability: moderate,  accelerations:  Present,  decelerations:  none UC:   regular, every 2-3 minutes SVE:   Dilation: 5 Effacement (%): 50 Station: -2 Exam by:: [name]  Labs: Lab Results  Component Value Date   WBC 7.9 10/07/2013   HGB 11.1* 10/07/2013   HCT 34.0* 10/07/2013   MCV 82.5 10/07/2013   PLT 261 10/07/2013    Assessment / Plan: Induction of labor due to Hx of IUFD,  Foley bulb bulging in vagina, removed without difficulty  Labor: progressing, some cervical change since last check Preeclampsia:  no signs or symptoms of toxicity Fetal Wellbeing:  Category I Pain Control:  Fentanyl I/D:  GBS neg Anticipated MOD:  NSVD  [name], [name] 10/07/2013, 9:24 PM

## 2013-10-07 NOTE — Progress Notes (Signed)
Explained in spanish importance of turning to side position-pt states she needs to got to bathroom. Assisted to br.

## 2013-10-08 ENCOUNTER — Encounter (HOSPITAL_COMMUNITY): Payer: Self-pay

## 2013-10-08 LAB — CBC
HEMATOCRIT: 31.5 % — AB (ref 36.0–46.0)
HEMOGLOBIN: 10 g/dL — AB (ref 12.0–15.0)
MCH: 26.1 pg (ref 26.0–34.0)
MCHC: 31.7 g/dL (ref 30.0–36.0)
MCV: 82.2 fL (ref 78.0–100.0)
PLATELETS: 238 10*3/uL (ref 150–400)
RBC: 3.83 MIL/uL — AB (ref 3.87–5.11)
RDW: 15 % (ref 11.5–15.5)
WBC: 18.2 10*3/uL — ABNORMAL HIGH (ref 4.0–10.5)

## 2013-10-08 MED ORDER — OXYCODONE-ACETAMINOPHEN 5-325 MG PO TABS: 1.0000 | ORAL_TABLET | ORAL | Status: DC | PRN

## 2013-10-08 MED ORDER — LANOLIN HYDROUS EX OINT: TOPICAL_OINTMENT | CUTANEOUS | Status: DC | PRN

## 2013-10-08 MED ORDER — BENZOCAINE-MENTHOL 20-0.5 % EX AERO
1.0000 | INHALATION_SPRAY | CUTANEOUS | Status: DC | PRN
Start: 2013-10-08 — End: 2013-10-09

## 2013-10-08 MED ORDER — ONDANSETRON HCL 4 MG/2ML IJ SOLN: 4.0000 mg | INTRAMUSCULAR | Status: DC | PRN

## 2013-10-08 MED ORDER — ONDANSETRON HCL 4 MG PO TABS: 4.0000 mg | ORAL_TABLET | ORAL | Status: DC | PRN

## 2013-10-08 MED ORDER — SENNOSIDES-DOCUSATE SODIUM 8.6-50 MG PO TABS
2.0000 | ORAL_TABLET | ORAL | Status: DC
  Administered 2013-10-08: 2 via ORAL
  Filled 2013-10-08: qty 2

## 2013-10-08 MED ORDER — PRENATAL MULTIVITAMIN CH
1.0000 | ORAL_TABLET | Freq: Every day | ORAL | Status: DC
  Administered 2013-10-08: 1 via ORAL
  Filled 2013-10-08: qty 1

## 2013-10-08 MED ORDER — SIMETHICONE 80 MG PO CHEW: 80.0000 mg | CHEWABLE_TABLET | ORAL | Status: DC | PRN

## 2013-10-08 MED ORDER — IBUPROFEN 600 MG PO TABS
600.0000 mg | ORAL_TABLET | Freq: Four times a day (QID) | ORAL | Status: DC
  Administered 2013-10-08 – 2013-10-09 (×4): 600 mg via ORAL
  Filled 2013-10-08 (×4): qty 1

## 2013-10-08 MED ORDER — DIPHENHYDRAMINE HCL 25 MG PO CAPS: 25.0000 mg | ORAL_CAPSULE | Freq: Four times a day (QID) | ORAL | Status: DC | PRN

## 2013-10-08 MED ORDER — DIBUCAINE 1 % RE OINT: 1.0000 "application " | TOPICAL_OINTMENT | RECTAL | Status: DC | PRN

## 2013-10-08 MED ORDER — TETANUS-DIPHTH-ACELL PERTUSSIS 5-2.5-18.5 LF-MCG/0.5 IM SUSP: 0.5000 mL | Freq: Once | INTRAMUSCULAR | Status: DC

## 2013-10-08 MED ORDER — WITCH HAZEL-GLYCERIN EX PADS: 1.0000 "application " | MEDICATED_PAD | CUTANEOUS | Status: DC | PRN

## 2013-10-08 MED ORDER — ZOLPIDEM TARTRATE 5 MG PO TABS: 5.0000 mg | ORAL_TABLET | Freq: Every evening | ORAL | Status: DC | PRN

## 2013-10-08 NOTE — Lactation Note (Signed)
This note was copied from the chart of [facility]. Lactation Consultation Note Initial consultation; baby 2513 hours old; in house Spanish interpreter present for consult. Mom states baby has been sleepy, and when he has fed, he spits up.  Offered to assist with a feeding, mom accepts. Assisted mom to get baby latched on the right side in cross cradle. Baby fed well; mom denies pain.  Enc mom to review the baby and me book breast feeding basics. Reviewed lactation services, community resources, and BFSG.  Enc mom to call if she has any concerns.   Patient Name: [name] Today's Date: 10/08/2013 Reason for consult: Initial assessment   Maternal Data Has patient been taught Hand Expression?: Yes Does the patient have breastfeeding experience prior to this delivery?: Yes  Feeding Feeding Type: Breast Fed Length of feed: 20 min  LATCH Score/Interventions Latch: Grasps breast easily, tongue down, lips flanged, rhythmical sucking.  Audible Swallowing: A few with stimulation  Type of Nipple: Everted at rest and after stimulation  Comfort (Breast/Nipple): Soft / non-tender     Hold (Positioning): No assistance needed to correctly position infant at breast. Intervention(s): Breastfeeding basics reviewed;Support Pillows;Position options;Skin to skin  LATCH Score: 9  Lactation Tools Discussed/Used     Consult Status Consult Status: PRN    Lenard ForthSanders, Rilya Longo Fulmer 10/08/2013, 3:41 PM

## 2013-10-08 NOTE — Progress Notes (Signed)
UR chart review completed.  

## 2013-10-09 MED ORDER — IBUPROFEN 600 MG PO TABS: 600.0000 mg | ORAL_TABLET | Freq: Four times a day (QID) | ORAL | Status: DC

## 2013-10-09 NOTE — Discharge Summary (Signed)
Attestation of Attending Supervision of Advanced Practitioner (PA/CNM/NP): Evaluation and management procedures were performed by the Advanced Practitioner under my supervision and collaboration.  I have reviewed the Advanced Practitioner's note and chart, and I agree with the management and plan.  Richerd Grime, MD, FACOG Attending Obstetrician & Gynecologist Faculty Practice, [facility]   

## 2013-10-09 NOTE — Discharge Instructions (Signed)

## 2013-10-09 NOTE — Discharge Summary (Signed)
Obstetric Discharge Summary Reason for Admission: induction of labor Prenatal Procedures: NST Intrapartum Procedures: spontaneous vaginal delivery Postpartum Procedures: none Complications-Operative and Postpartum: 1st degree perineal laceration Hemoglobin  Date Value Ref Range Status  10/08/2013 10.0* 12.0 - 15.0 g/dL Final  1/61/09604/20/2015 45.411.2   Final     HCT  Date Value Ref Range Status  10/08/2013 31.5* 36.0 - 46.0 % Final  07/27/2013 34   Final  [facility] Course: [name] is a 33 y.o. female [phone] with IUP at 3271w0d presenting for IOL for hx of still birth with first pregnancy.  Has been doing well. No complications this pregnancy. No lof, vb. +FM.  [facility] [name]. Has been in twice weekly testing without complications. GBS neg.  Delivery Note  At 1:08 AM a viable female was delivered via Vaginal, Spontaneous Delivery (Presentation: ; Occiput Anterior). APGAR: 7, 9; weight .  Placenta status: Intact, Spontaneous. Cord: 3 vessels with the following complications: none  Anesthesia: Other  Episiotomy: n/a  Lacerations: 1st degree perineal  Suture Repair: 3.0 vicryl  Est. Blood Loss (mL): 250cc  Pt delivered a liveborn female via NSVD with spontaneous cry over intact perineum. Delivery complicated by a loose nuchal X1 which was reduced. Delivery of anterior shoulder delayed for approx 30sec, suprapubic pressure/mcroberts performed with success. Baby placed on maternal abdomen. Cord cut and clamped immediately . Placenta delivered intact with 3V cord via traction and pitocin. 1st degree tear repaired in the usual fashion. No complications. Mom and baby to postpartum  Mom to postpartum. Baby to Couplet care / Skin to Skin.  [name], [name]  10/08/2013, 1:43 AM    Has done well postpartum , but has not slept much. Breastfeeding well.  Ready for discharge, interpretor used  Physical Exam:  General: alert, cooperative and no distress Lochia:  appropriate Uterine Fundus: firm Incision: healing well DVT Evaluation: No evidence of DVT seen on physical exam.  Discharge Diagnoses: Term Pregnancy-delivered  Discharge Information: Date: 10/09/2013 Activity: unrestricted and pelvic rest Diet: routine Medications: PNV and Ibuprofen Condition: stable and improved Instructions: refer to practice specific booklet Discharge to: home Follow-up Information   Follow up with [name] In 4 weeks. (Someone from [facility] will call with appointment)    Contact information:   [address] [place] [phone]      Newborn Data: Live born female  Birth Weight: 7 lb 7.4 oz (3385 g) APGAR: 7, 9  Home with mother.  [name] Of Richardson[place]ILL[place],[name] 10/09/2013, 7:56 AM

## 2013-11-13 ENCOUNTER — Encounter: Payer: Self-pay | Admitting: Obstetrics & Gynecology

## 2013-11-13 ENCOUNTER — Ambulatory Visit (INDEPENDENT_AMBULATORY_CARE_PROVIDER_SITE_OTHER): Payer: Medicaid Other | Admitting: Obstetrics & Gynecology

## 2013-11-13 ENCOUNTER — Encounter: Payer: Self-pay | Admitting: *Deleted

## 2013-11-13 VITALS — BP 102/58 | HR 67 | Temp 98.4°F | Wt 131.5 lb

## 2013-11-13 DIAGNOSIS — Z3009 Encounter for other general counseling and advice on contraception: Secondary | ICD-10-CM

## 2013-11-13 DIAGNOSIS — Z01812 Encounter for preprocedural laboratory examination: Secondary | ICD-10-CM

## 2013-11-13 LAB — POCT PREGNANCY, URINE: Preg Test, Ur: NEGATIVE

## 2013-11-13 MED ORDER — NORGESTIMATE-ETH ESTRADIOL 0.25-35 MG-MCG PO TABS: 1.0000 | ORAL_TABLET | Freq: Every day | ORAL | Status: DC

## 2013-11-13 NOTE — Addendum Note (Signed)
Addended by: Louanna RawAMPBELL, Estera Ozier M on: 11/13/2013 10:14 AM   Modules accepted: Orders, Medications

## 2013-11-13 NOTE — Progress Notes (Signed)
Patient ID: Jaime NatalEulalia Melo Mayo, female   DOB: 07/29/1980, 33 y.o.   MRN: [id number] Subjective:     [name] is a 33 y.o. female who presents for a postpartum visit. She is 5 weeks postpartum following a spontaneous vaginal delivery. I have fully reviewed the prenatal and intrapartum course. The delivery was at 39 gestational weeks. Outcome: spontaneous vaginal delivery. Anesthesia: none. Postpartum course has been uncomplicated. Baby's course has been unremarkable. Baby is feeding by breast and bottle. Bleeding no bleeding. Bowel function is normal. Bladder function is normal. Patient is not sexually active. Contraception method is abstinence. Postpartum depression screening: negative.  The following portions of the patient's history were reviewed and updated as appropriate: allergies, current medications, past family history, past medical history, past social history, past surgical history and problem list.  Review of Systems A comprehensive review of systems was negative.   Objective:    BP 102/58  Pulse 67  Temp(Src) 98.4 F (36.9 C) (Oral)  Wt 131 lb 8 oz (59.648 kg)  Breastfeeding? Yes  General:  alert and no distress           Abdomen: soft, non-tender; bowel sounds normal; no masses,  no organomegaly   Vulva:  normal  Vagina: normal vagina  Cervix:  no cervical motion tenderness  Corpus: normal size, contour, position, consistency, mobility, non-tender  Adnexa:  no mass, fullness, tenderness  Rectal Exam: Not performed.        Assessment:     5 weeks postpartum exam. Pap smear not done at today's visit.  Pt desires OCPs- will go to the HD for IUD if she is unable to take the meds   Plan:    1. Contraception: OCP (estrogen/progesterone) 2. Follow up in: 1 year or as needed.

## 2013-11-13 NOTE — Progress Notes (Signed)
Patient here today for PP. Uninsured. Discussed birth control options-- patient would like to begin OCPs ($4 list at [facility]). Patient denies having sex in the last two weeks and since delivery. UPT obtained and negative.

## 2013-11-13 NOTE — Patient Instructions (Signed)
Uso de los anticonceptivos orales (Oral Contraception Use) Los anticonceptivos orales (ACO) son medicamentos que se utilizan para evitar el embarazo. Su funcin es evitar que los ovarios liberen vulos. Las hormonas de los ACO tambin hacen que el moco cervical se haga ms espeso, lo que evita que el esperma ingrese al tero. Tambin hacen que la membrana que recubre internamente al tero se vuelva ms fina, lo que no permite que el huevo fertilizado se adhiera a la pared del tero. Los ACO son muy efectivos cuando se toman exactamente como se prescriben. Sin embargo, no previenen contra las enfermedades de transmisin sexual (ETS). La prctica del sexo seguro, como el uso de preservativos, junto con los ACO, ayudan a prevenir ese tipo de enfermedades. Antes de tomar ACO, debe hacerse un examen fsico y un Papanicolau. El mdico podr indicarle anlisis de sangre, si es necesario. El mdico se asegurar de que usted sea una buena candidata para usar anticonceptivos orales. Converse con su mdico acerca de los posibles efectos secundarios de los ACO que podran recetarle. Cuando se inicia el uso de ACO, se pueden tomar durante 2 a 3 meses para que el cuerpo se adapte a los cambios en los niveles hormonales en el cuerpo.  CMO TOMAR LOS ANTICONCEPTIVOS ORALES El mdico le indicar como comenzar a tomar el primer ciclo de ACO. De lo contrario usted puede:   Comenzar el da de inicio del ciclo menstrual. No necesitar proteccin anticonceptiva adicional al comenzar en este momento.   Comenzar el primer domingo luego de su perodo menstrual, o el da en que adquiere el medicamento. En estos casos deber tener proteccin anticonceptiva adicional durante los primeros 7 das del ciclo.   Comenzar a tomarlos en cualquier momento del ciclo. Si toma el anticonceptivo dentro de los 5 das de iniciado el perodo, estar protegida de quedar embarazada inmediatamente. En este caso, no necesitar una forma adicional de  anticonceptivos. Si comienza en cualquier otro momento del ciclo menstrual, necesitar usar otra forma de anticonceptivo durante 7 das. Si sus ACO son del tipo de los llamados minipldoras, podrn impedir el embarazo despus de tomarlas por 2 das (48 horas). Luego de comenzar a tomar los ACO:   Si olvid de tomar 1 pldora, tmela tan pronto como lo recuerde. Tome la siguiente pldora a la hora habitual.   Si dej de tomar 2 o ms pldoras, comunquese con su mdico ya que diferentes pldoras tienen diferentes instrucciones para las dosis que no se han tomado. Si olvida tomar 2 o ms pldoras, utilice un mtodo anticonceptivo adicional hasta que comience su prximo perodo menstrual.   Si utiliza el envase de 28 pldoras que contienen pldoras inactivas y olvida tomar 1 de las ltimas 7 (pldoras sin hormonas), sto no tiene importancia. Simplemente deseche el resto de las pldoras que no contienen hormonas y comience un nuevo envase.  No importa cuando comience a tomar los anticonceptivos, siempre empiece un nuevo envase el mismo da de la semana. Tenga un envase extra de ACO y use un mtodo anticonceptivo adicional para el caso en que se olvide de tomar algunas pldoras o pierda la caja.  INSTRUCCIONES PARA EL CUIDADO EN EL HOGAR   No fume.   Use siempre un condn para protegerse contra las enfermedades de transmisin sexual. Los ACO no protegen contra las enfermedades de transmisin sexual.   Use un almanaque para marcar los das de su perodo menstrual.   Lea la informacin y consejos que vienen con las ACO. Hable con   el profesional si tiene dudas.  SOLICITE ATENCIN MDICA SI:   Presenta nuseas o vmitos.   Tiene flujo o sangrado vaginal anormal.   Aparece una erupcin cutnea.   No tiene el perodo menstrual.   Pierde el cabello.   Necesita tratamiento por cambios en su estado de nimo o por depresin.   Se siente mareada al Liberty Mutualtomar los ACO.   Comienza a  aparecer acn con el uso de los ACO.   Ardelle AntonQueda embarazada.  SOLICITE ATENCIN MDICA DE INMEDIATO SI:   Siente dolor en el pecho.   Le falta el aire.   Le duele mucho la cabeza y no puede Human resources officercontrolar el dolor.   Siente adormecimiento o tiene dificultad para hablar.   Tiene problemas de visin.   Presenta dolor, inflamacin o hinchazn en las piernas.  Document Released: 03/15/2011 Document Revised: 11/26/2012 [facility] Patient Information 2015 [place] Ranch[place]Care, [place]. This information is not intended to replace advice given to you by your health care provider. Make sure you discuss any questions you have with your health care provider. Informacin sobre los Civil Service fast streameranticonceptivos orales (Oral Contraception Information) Los anticonceptivos orales (ACO) son medicamentos que se utilizan para Location managerevitar el embarazo. Su funcin es ALLTEL Corporationevitar que los ovarios liberen vulos. Las hormonas de los ACO tambin hacen que el moco cervical se haga ms espeso, lo que evita que el esperma ingrese al tero. Tambin hacen que la membrana que recubre internamente al tero se vuelva ms fina, lo que no permite que el huevo fertilizado se adhiera a la pared del tero. Los ACO son muy efectivos cuando se toman exactamente como se prescriben. Sin embargo, no previenen contra las enfermedades de transmisin sexual (ETS). La prctica del sexo seguro, como el uso de preservativos, junto con la [place]pldora, [place]ayudan a prevenir ese tipo de enfermedades.  Antes de tomar la pldora, usted debe hacerse un examen fsico y un test de Pap. El mdico podr indicarle anlisis de [place], si es necesario. El mdico se asegurar de que usted sea Nundauna buena candidata para usar anticonceptivos orales. Converse con su mdico acerca de los posibles efectos secundarios de los ACO que podran recetarle. Cuando se inicia el uso de ACO, puede llevar 2 a 3 meses para que su organismo se adapte a los cambios en los niveles hormonales.  TIPOS DE ANTICONCEPTIVOS  ORALES  Pldora combinada: esta pldora contiene las hormonas estrgeno y progestina (progesterona sinttica). La pldora combinada viene en envases para [address]21 das, [address] das o [address] das. Algunos tipos de pldoras combinadas deben tomarse de manera continua (pldoras para 365 das). En los envases para [address]21 das, usted no tomar las pldoras durante 7 [address]  [address] despus de la ltima pldora. En los envases para [address] das, la pldora se toma CarMaxtodos los das. Las ltimas 7 no contienen hormonas. Ciertos tipos de pldoras tienen ms de 21 pldoras que contienen hormonas. En los envases para [address]91 das, las primeras 84 pldoras contienen ambas hormonas y las ltimas 7 pldoras no contienen hormonas o contienen slo Cabin crewestrgenos.  La minipldora: esta pldora contiene la hormona progesterona solamente. Es necesario tomarla todos los das de [place] Grovemanera continua. Es importante que las tome a la misma hora todos Broeck Pointelos das. Viene en envases de 28 pldoras. Las 28 pldoras contienen la hormona.  VENTAJAS DE LOS ANTICONCEPTIVOS ORALES  Disminuye los sntomas premenstruales.   Se [place]a para tratar los Best Buyclicos menstruales.   Regula el ciclo menstrual.   Disminuye el ciclo menstrual abundante.   Puede mejorar el acn, segn el tipo  de pldora.   Trata hemorragias uterinas anormales.   Trata el sndrome ovrico poliqustico.   Trata la endometriosis.   Pueden usarse como anticonceptivo de Associate Professor.  FACTORES QUE PUEDEN HACER QUE LOS ANTICONCEPTIVOS ORALES SEAN MENOS EFECTIVOS Pueden ser menos efectivos si:   Olvid tomar la [employer] a la misma hora.   Tiene una enfermedad estomacal o intestinal que disminuye la absorcin de la pldora.   Ingiere simultneamente los anticonceptivos orales junto con otros medicamentos que los hacen menos efectivos, como antibiticos, ciertos medicamentos para el VIH y algunos medicamentos para las convulsiones.   Usted toma anticonceptivos orales que han vencido.    Cuando se [place] el envase de [place], se olvida de recomenzar el uso [employer] 7.  RIESGOS ASOCIADOS AL USO DE ANTICONCEPTIVOS ORALES  Los anticonceptivos orales pueden en algunos casos causar efectos secundarios como:  Dolor de [place].  Nuseas.  Inflamacin mamaria.  Hemorragia vaginal o manchado irregular. Las pldoras combinadas tambin se asocian a un pequeo aumento en el riesgo de:  Cogulos sanguneos.  Ataque cardaco.  Ictus. Document Released: 01/03/2005 Document Revised: 01/14/2013 [facility] Patient Information 2015 [place], [place]. This information is not intended to replace advice given to you by your health care provider. Make sure you discuss any questions you have with your health care provider.

## 2014-01-12 ENCOUNTER — Ambulatory Visit: Payer: Medicaid Other

## 2014-02-08 ENCOUNTER — Encounter: Payer: Self-pay | Admitting: Obstetrics & Gynecology

## 2014-03-02 ENCOUNTER — Encounter: Payer: Self-pay | Admitting: Family Medicine

## 2014-03-12 ENCOUNTER — Encounter: Payer: Self-pay | Admitting: Obstetrics & Gynecology

## 2014-03-15 ENCOUNTER — Encounter: Payer: Self-pay | Admitting: Obstetrics & Gynecology

## 2014-03-24 ENCOUNTER — Encounter: Payer: Self-pay | Admitting: Internal Medicine

## 2014-03-24 ENCOUNTER — Ambulatory Visit (HOSPITAL_BASED_OUTPATIENT_CLINIC_OR_DEPARTMENT_OTHER): Payer: Medicaid Other | Admitting: *Deleted

## 2014-03-24 ENCOUNTER — Ambulatory Visit: Payer: Medicaid Other | Attending: Internal Medicine | Admitting: Internal Medicine

## 2014-03-24 VITALS — BP 110/76 | HR 69 | Temp 98.5°F | Resp 16 | Ht 59.0 in | Wt 137.0 lb

## 2014-03-24 DIAGNOSIS — M5431 Sciatica, right side: Secondary | ICD-10-CM | POA: Insufficient documentation

## 2014-03-24 DIAGNOSIS — Z23 Encounter for immunization: Secondary | ICD-10-CM

## 2014-03-24 MED ORDER — IBUPROFEN 600 MG PO TABS: 600.0000 mg | ORAL_TABLET | Freq: Three times a day (TID) | ORAL | Status: DC | PRN

## 2014-03-24 NOTE — Progress Notes (Signed)
Patient ID: [name], female   DOB: 11/10/1980, 33 y.o.   MRN: [id number]  [id number]  [id number]  DOB - 03/10/1981  CC:  Chief Complaint  Patient presents with  . Establish Care       HPI: [name] is a 33 y.o. female here today to establish medical care. She has no past medical history.  She presents today with c/o of right hip pain that sometimes radiates up her side causing difficulty with ambulation.  Pain sometimes radiates down her right buttock and down the back of her leg.  Pain has been present since June when she was pregnant and has persisted after birth.  Described as a sharp shooting pain.  Aggravated by trying to get up from sitting or laying down at night.  Has not tried any medication. Relieved by resting and days that she does not work.    Denies bowel/bladder dysfunction.   Patient has No headache, No chest pain, No abdominal pain - No Nausea, No new weakness tingling or numbness, No Cough - SOB.  No Known Allergies Past Medical History  Diagnosis Date  . Medical history non-contributory    Current Outpatient Prescriptions on File Prior to Visit  Medication Sig Dispense Refill  . ibuprofen (ADVIL,MOTRIN) 600 MG tablet Take 1 tablet (600 mg total) by mouth every 6 (six) hours. 30 tablet 0  . norgestimate-ethinyl estradiol (ORTHO-CYCLEN,SPRINTEC,PREVIFEM) 0.25-35 MG-MCG tablet Take 1 tablet by mouth daily. (Patient not taking: Reported on 03/24/2014) 1 Package 11  . prenatal vitamin w/FE, FA (PRENATAL 1 + 1) 27-1 MG TABS tablet Take 1 tablet by mouth daily at 12 noon.     No current facility-administered medications on file prior to visit.   History reviewed. No pertinent family history. History   Social History  . Marital Status: Single    Spouse Name: N/A    Number of Children: N/A  . Years of Education: N/A   Occupational History  . Not on file.   Social History Main Topics  . Smoking status: Never Smoker   . Smokeless  tobacco: Never Used  . Alcohol Use: No  . Drug Use: No  . Sexual Activity: Not Currently   Other Topics Concern  . Not on file   Social History Narrative    Review of Systems  Musculoskeletal: Positive for back pain (and right hip).  All other systems reviewed and are negative.      Objective:   Filed Vitals:   03/24/14 1635  BP: 110/76  Pulse: 69  Temp: 98.5 F (36.9 C)  Resp: 16    Physical Exam  Cardiovascular: Normal rate, regular rhythm and normal heart sounds.   Pulmonary/Chest: Effort normal and breath sounds normal.  Musculoskeletal:  Positive right straight leg raise     Lab Results  Component Value Date   WBC 18.2* 10/08/2013   HGB 10.0* 10/08/2013   HCT 31.5* 10/08/2013   MCV 82.2 10/08/2013   PLT 238 10/08/2013   Lab Results  Component Value Date   CREATININE 0.55 04/06/2012   BUN 8 04/06/2012   NA 139 04/06/2012   K 3.5 04/06/2012   CL 106 04/06/2012   CO2 24 04/06/2012    No results found for: HGBA1C Lipid Panel  No results found for: CHOL, TRIG, HDL, CHOLHDL, VLDL, LDLCALC     Assessment and plan:   [name] was seen today for establish care.  Diagnoses and associated orders for this visit:  Sciatic nerve pain, right -  ibuprofen (ADVIL,MOTRIN) 600 MG tablet; Take 1 tablet (600 mg total) by mouth every 8 (eight) hours as needed.  Due to language barrier, an interpreter was present during the history-taking and subsequent discussion (and for part of the physical exam) with this patient.   Return if symptoms worsen or fail to improve.  The patient was given clear instructions to return to medical center if symptoms don't improve, worsen or new problems develop. The patient verbalized understanding. The patient was told to call to get lab results if they haven't heard anything in the next week.     Holland CommonsKECK, Davarius Ridener, NP-C [facility] [phone] 03/24/2014, 4:46 PM

## 2014-03-24 NOTE — Patient Instructions (Signed)
Citica  (Sciatica)  La citica es el dolor, debilidad, entumecimiento u hormigueo a lo largo del nervio citico. El nervio comienza en la zona inferior de la espalda y desciende por la parte posterior de cada pierna. El nervio controla los msculos de la parte inferior de la pierna y de la zona posterior de la rodilla, y transmite la sensibilidad a la parte posterior del muslo, la pierna y la planta del pie. La citica es un sntoma de otras afecciones mdicas. Por ejemplo, un dao a los nervios o algunas enfermedades como un disco herniado o un espoln seo en la columna vertebral, podran daarle o presionar en el nervio citico. Esto causa dolor, debilidad y otras sensaciones normalmente asociadas con la citica. Generalmente la citica afecta slo un lado del cuerpo. CAUSAS   Disco herniado o desplazado.  Enfermedad degenerativa del disco.  Un sndrome doloroso que compromete un msculo angosto de los glteos (sndrome piriforme).  Lesin o fractura plvica.  Embarazo.  Tumor (casos raros). SNTOMAS  Los sntomas pueden variar de leves a muy graves. Por lo general, los sntomas descienden desde la zona lumbar a las nalgas y la parte posterior de la pierna. Ellos son:   Hormigueo leve o dolor sordo en la parte inferior de la espalda, la pierna o la cadera.  Adormecimiento en la parte posterior de la pantorrilla o la planta del pie.  Sensacin de quemazn en la zona lumbar, la pierna o la cadera.  Dolor agudo en la zona inferior de la espalda, la pierna o la cadera.  Debilidad en las piernas.  Dolor de espalda intenso que inhibe los movimientos. Los sntomas pueden empeorar al toser, estornudar, rer o estar sentado o parado durante mucho tiempo. Adems, el sobrepeso puede empeorar los sntomas.  DIAGNSTICO  Su mdico le har un examen fsico para buscar los sntomas comunes de la citica. Le pedir que haga algunos movimientos o actividades que activaran el dolor del nervio  citico. Para encontrar las causas de la citica podr indicarle otros estudios. Estos pueden ser:   Anlisis de sangre.  Radiografas.  Pruebas de diagnstico por imgenes, como resonancia magntica o tomografa computada. TRATAMIENTO  El tratamiento se dirige a las causas de la citica. A veces, el tratamiento no es necesario, y el dolor y el malestar desaparecen por s mismos. Si necesita tratamiento, su mdico puede sugerir:   Medicamentos de venta libre para aliviar el dolor.  Medicamentos recetados, como antiinflamatorios, relajantes musculares o narcticos.  Aplicacin de calor o hielo en la zona del dolor.  Inyecciones de corticoides para disminuir el dolor, la irritacin y la inflamacin alrededor del nervio.  Reduccin de la actividad en los perodos de dolor.  Ejercicios y estiramiento del abdomen para fortalecer y mejorar la flexibilidad de la columna vertebral. Su mdico puede sugerirle perder peso si el peso extra empeora el dolor de espalda.  Fisioterapia.  La ciruga para eliminar lo que presiona o pincha el nervio, como un espoln seo o parte de una hernia de disco. INSTRUCCIONES PARA EL CUIDADO EN EL HOGAR   Slo tome medicamentos de venta libre o recetados para calmar el dolor o el malestar, segn las indicaciones de su mdico.  Aplique hielo sobre el rea dolorida durante 20 minutos 3-4 veces por da durante los primeras 48-72 horas. Luego intente aplicar calor de la misma manera.  Haga ejercicios, elongue o realice sus actividades habituales, si no le causan ms dolor.  Cumpla con todas las sesiones de fisioterapia, segn le   indique su mdico.  Cumpla con todas las visitas de control, segn le indique su mdico.  No use tacones altos o zapatos que no tengan buen apoyo.  Verifique que el colchn no sea muy blando. Un colchn firme aliviar el dolor y las molestias. SOLICITE ATENCIN MDICA DE INMEDIATO SI:   Pierde el control de la vejiga o del intestino  (incontinencia).  Aumenta la debilidad en la zona inferior de la espalda, la pelvis, las nalgas o las piernas.  Siente irritacin o inflamacin en la espalda.  Tiene sensacin de ardor al orinar.  El dolor empeora cuando se acuesta o lo despierta por la noche.  El dolor es peor del que experiment en el pasado.  Dura ms de 4 semanas.  Pierde peso sin motivo de manera sbita. ASEGRESE DE QUE:   Comprende estas instrucciones.  Controlar su enfermedad.  Solicitar ayuda de inmediato si no mejora o si empeora. Document Released: 03/26/2005 Document Revised: 09/25/2011 ExitCare Patient Information 2015 ExitCare, LLC. This information is not intended to replace advice given to you by your health care provider. Make sure you discuss any questions you have with your health care provider.  

## 2014-03-24 NOTE — Progress Notes (Signed)
Pt is here to establish care. Pt reports having occasional right hip pain.

## 2014-04-29 ENCOUNTER — Ambulatory Visit: Payer: Medicaid Other | Attending: Internal Medicine

## 2014-09-27 ENCOUNTER — Ambulatory Visit: Payer: Self-pay | Attending: Internal Medicine

## 2014-11-19 IMAGING — US US OB COMP +14 WK
1 series · 12 of 28 positions shown · non-contrast
Comparison: none

[Series 1: us ob comp +14 wk · 12 of 103 slices shown]
[im 4/103]
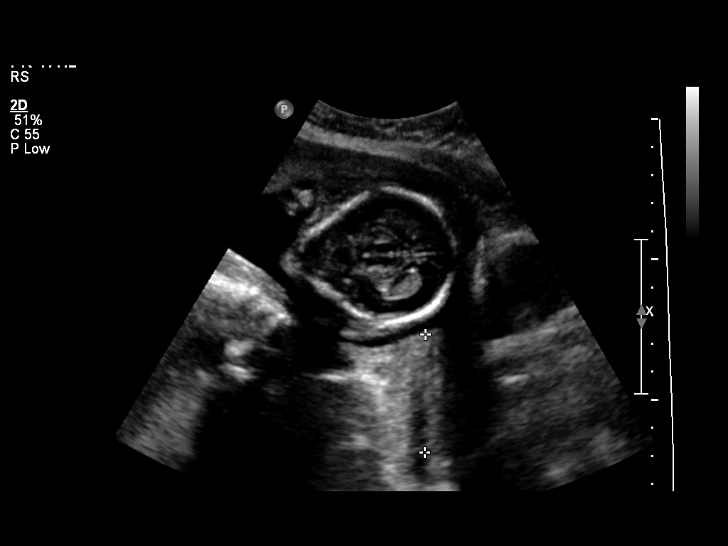
[im 12/103]
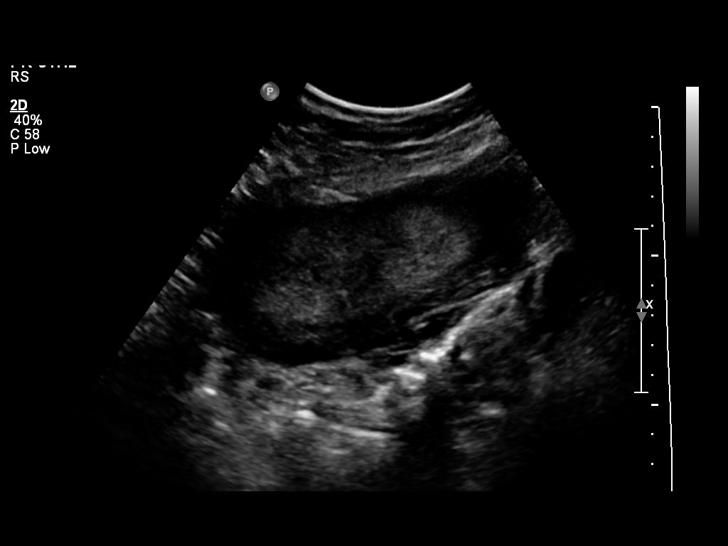
[im 19/103]
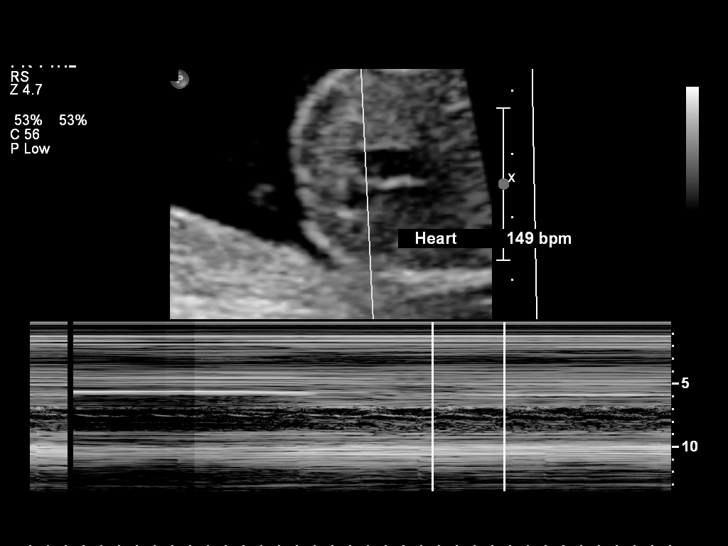
[im 31/103]
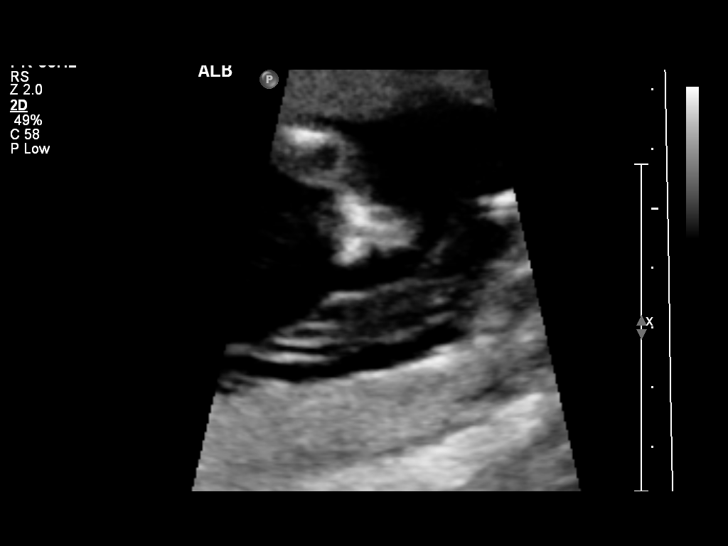
[im 38/103]
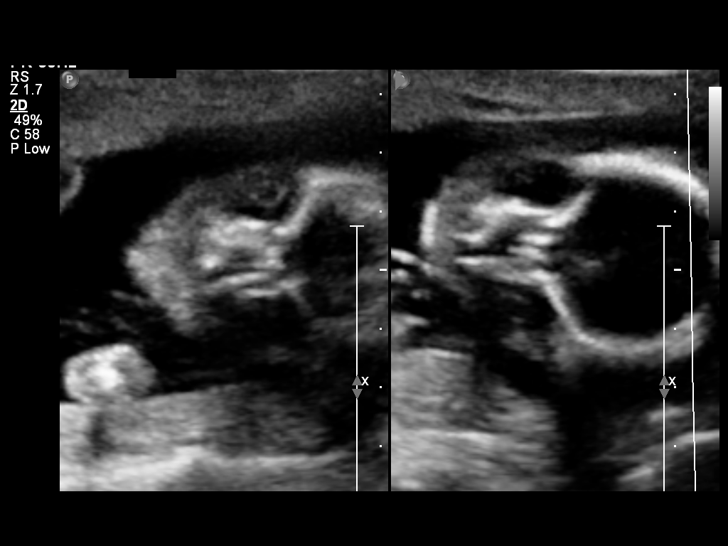
[im 46/103]
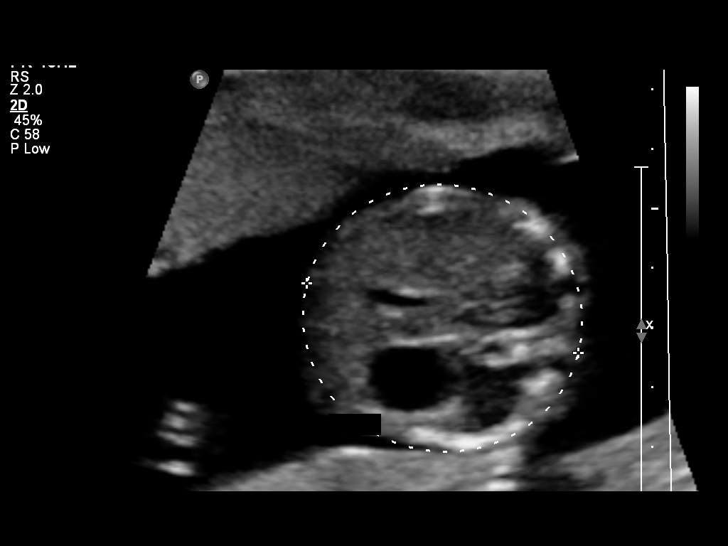
[im 57/103]
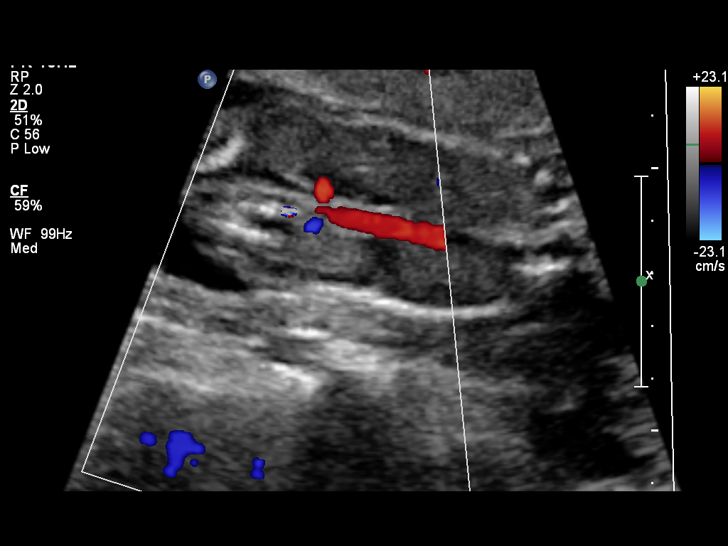
[im 65/103]
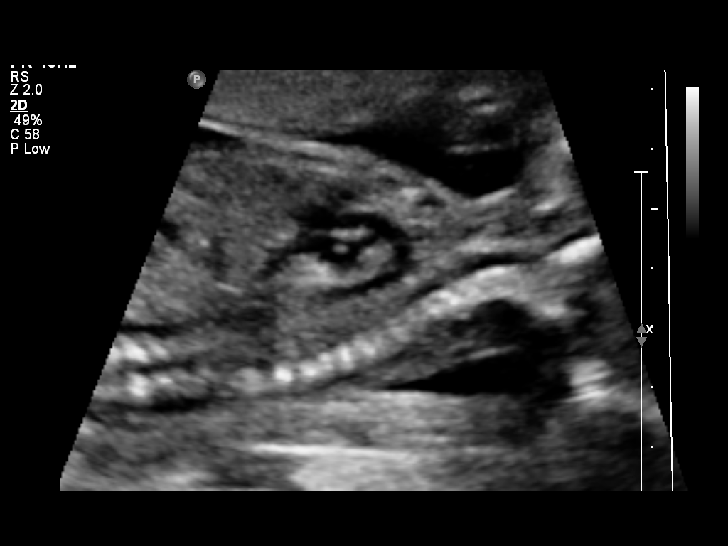
[im 72/103]
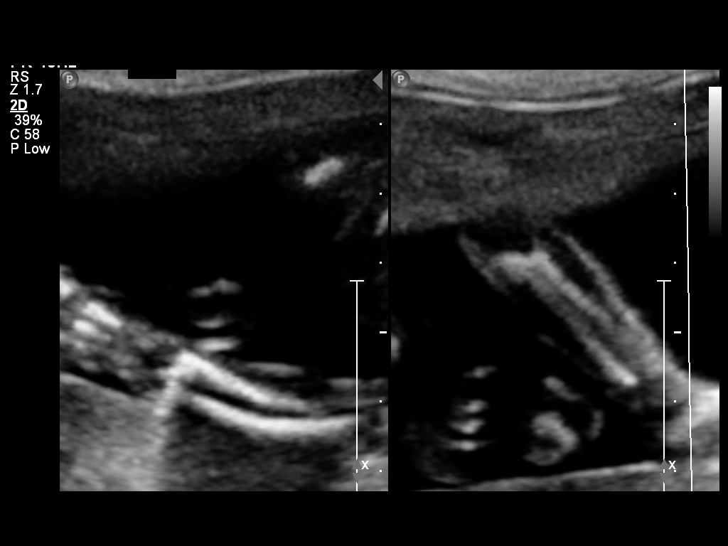
[im 84/103]
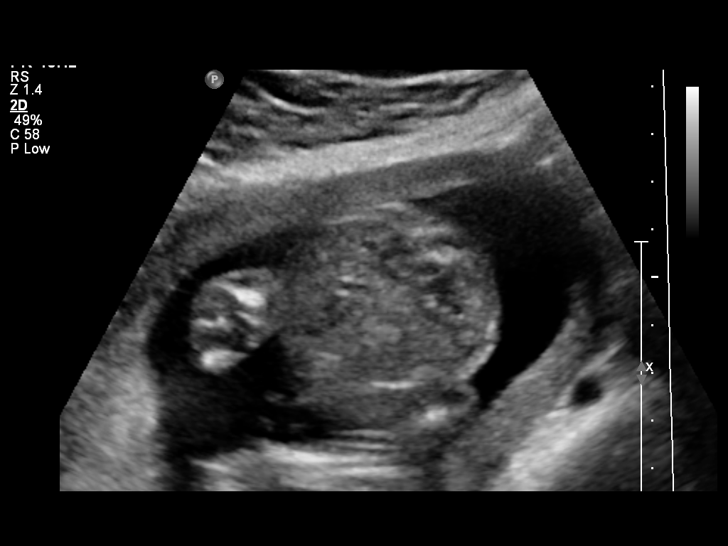
[im 91/103]
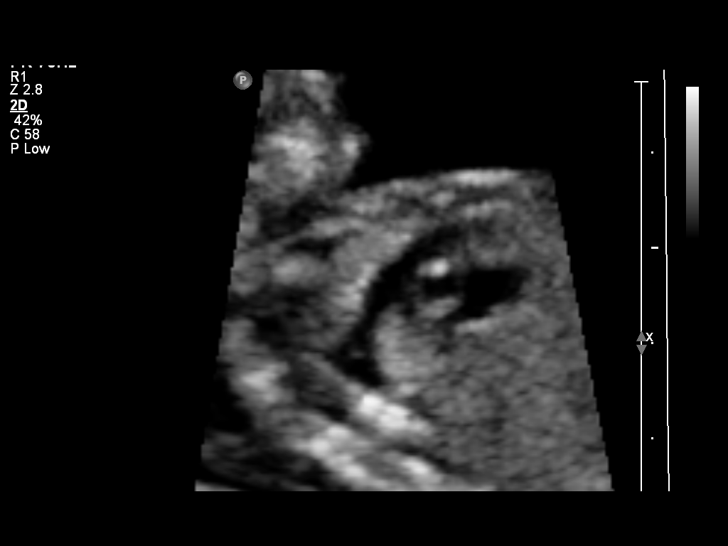
[im 99/103]
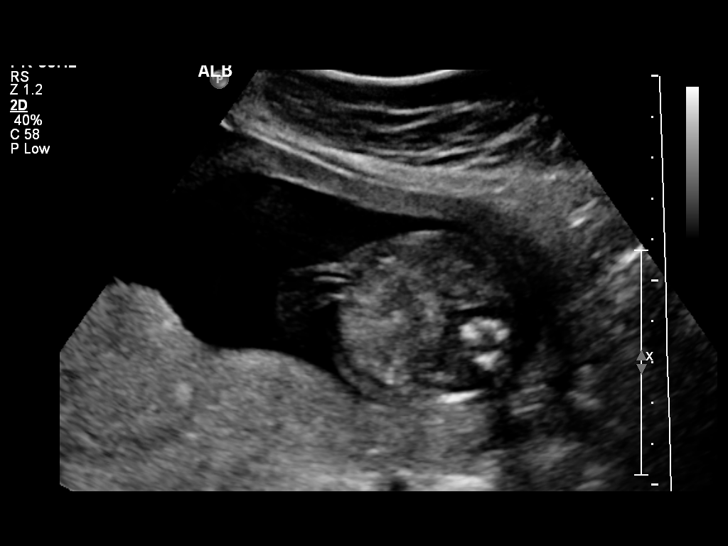

[12 of 28 positions shown; findings below may reference images not displayed]

OBSTETRICS REPORT
                      (Signed Final 05/25/2013 [DATE])

Service(s) Provided

 US OB COMP + 14 WK                                    76805.1
Indications

 Basic anatomic survey
 Poor obstetric history: Previous preterm delivery 

 8 months
Fetal Evaluation

 Num Of Fetuses:    1
 Fetal Heart Rate:  149                          bpm
 Cardiac Activity:  Observed
 Presentation:      Cephalic
 Placenta:          Right lateral, above
                    cervical os
 P. Cord            Visualized, central
 Insertion:

 Amniotic Fluid
 AFI FV:      Subjectively within normal limits
                                             Larg Pckt:    5.29  cm
Biometry

 BPD:     46.2  mm     G. Age:  20w 0d                CI:        76.06   70 - 86
                                                      FL/HC:      18.3   16.8 -

 HC:     167.9  mm     G. Age:  19w 3d       31  %    HC/AC:      1.18   1.09 -

 AC:     141.9  mm     G. Age:  19w 4d       40  %    FL/BPD:
 FL:      30.7  mm     G. Age:  19w 4d       36  %    FL/AC:      21.6   20 - 24
 HUM:     28.8  mm     G. Age:  19w 2d       43  %
 NFT:     5.32  mm

 Est. FW:     298  gm    0 lb 11 oz      45  %
Gestational Age

 LMP:           19w 5d        Date:  01/07/13                 EDD:   10/14/13
 U/S Today:     19w 4d                                        EDD:   10/15/13
 Best:          19w 5d     Det. By:  LMP  (01/07/13)          EDD:   10/14/13
Anatomy
 Cranium:          Appears normal         Aortic Arch:      Appears normal
 Fetal Cavum:      Appears normal         Ductal Arch:      Appears normal
 Ventricles:       Appears normal         Diaphragm:        Appears normal
 Choroid Plexus:   Appears normal         Stomach:          Appears normal
 Cerebellum:       Appears normal         Abdomen:          Appears normal
 Posterior Fossa:  Appears normal         Abdominal Wall:   Appears nml (cord
                                                            insert, abd wall)
 Nuchal Fold:      Appears normal         Cord Vessels:     Appears normal (3
                                                            vessel cord)
 Face:             Appears normal         Kidneys:          Appear normal
                   (orbits and profile)
 Lips:             Appears normal         Bladder:          Appears normal
 Heart:            Appears normal         Spine:            Appears normal
                   (4CH, axis, and
                   situs)
 RVOT:             Appears normal         Lower             Appears normal
                                          Extremities:
 LVOT:             Appears normal         Upper             Appears normal
                                          Extremities:

 Other:  Fetus appears to be a male. Heels visualized. Nasal bone visualized.
Targeted Anatomy

 Fetal Central Nervous System
 Cisterna Magna:
Cervix Uterus Adnexa

 Cervical Length:    3.4      cm

 Cervix:       Normal appearance by transabdominal scan.
 Uterus:       No abnormality visualized.
 Cul De Sac:   No free fluid seen.
 Left Ovary:    Not visualized.
 Right Ovary:   Not visualized.
 Adnexa:     No abnormality visualized.
Impression

 IUP at 19+5 weeks
 Normal detailed fetal anatomy
 Markers of aneuploidy: none
 Normal amniotic fluid volume
 Measurements consistent with LMP dating
Recommendations

 Follow-up as clinically indicated

## 2014-12-14 ENCOUNTER — Emergency Department (HOSPITAL_COMMUNITY): Payer: Medicaid Other

## 2014-12-14 ENCOUNTER — Inpatient Hospital Stay (HOSPITAL_COMMUNITY)
Admission: EM | Admit: 2014-12-14 | Discharge: 2014-12-16 | DRG: 419 | Disposition: A | Discharge status: Home / Self Care | Payer: Medicaid Other | Attending: General Surgery | Admitting: General Surgery

## 2014-12-14 ENCOUNTER — Encounter (HOSPITAL_COMMUNITY): Payer: Self-pay | Admitting: Emergency Medicine

## 2014-12-14 DIAGNOSIS — K8000 Calculus of gallbladder with acute cholecystitis without obstruction: Secondary | ICD-10-CM | POA: Diagnosis present

## 2014-12-14 DIAGNOSIS — K810 Acute cholecystitis: Secondary | ICD-10-CM

## 2014-12-14 LAB — CBC WITH DIFFERENTIAL/PLATELET
BASOS ABS: 0 10*3/uL (ref 0.0–0.1)
BASOS PCT: 0 % (ref 0–1)
Eosinophils Absolute: 0.2 10*3/uL (ref 0.0–0.7)
Eosinophils Relative: 2 % (ref 0–5)
HCT: 37 % (ref 36.0–46.0)
Hemoglobin: 12.5 g/dL (ref 12.0–15.0)
Lymphocytes Relative: 18 % (ref 12–46)
Lymphs Abs: 2 10*3/uL (ref 0.7–4.0)
MCH: 30.3 pg (ref 26.0–34.0)
MCHC: 33.8 g/dL (ref 30.0–36.0)
MCV: 89.6 fL (ref 78.0–100.0)
Monocytes Absolute: 0.6 10*3/uL (ref 0.1–1.0)
Monocytes Relative: 6 % (ref 3–12)
NEUTROS ABS: 8.5 10*3/uL — AB (ref 1.7–7.7)
Neutrophils Relative %: 74 % (ref 43–77)
PLATELETS: 300 10*3/uL (ref 150–400)
RBC: 4.13 MIL/uL (ref 3.87–5.11)
RDW: 12.7 % (ref 11.5–15.5)
WBC: 11.4 10*3/uL — ABNORMAL HIGH (ref 4.0–10.5)

## 2014-12-14 LAB — I-STAT BETA HCG BLOOD, ED (MC, WL, AP ONLY)

## 2014-12-14 LAB — COMPREHENSIVE METABOLIC PANEL
ALT: 55 U/L — ABNORMAL HIGH (ref 14–54)
AST: 45 U/L — ABNORMAL HIGH (ref 15–41)
Albumin: 3.7 g/dL (ref 3.5–5.0)
Alkaline Phosphatase: 117 U/L (ref 38–126)
Anion gap: 11 (ref 5–15)
BUN: 7 mg/dL (ref 6–20)
CHLORIDE: 103 mmol/L (ref 101–111)
CO2: 24 mmol/L (ref 22–32)
CREATININE: 0.67 mg/dL (ref 0.44–1.00)
Calcium: 8.9 mg/dL (ref 8.9–10.3)
Glucose, Bld: 158 mg/dL — ABNORMAL HIGH (ref 65–99)
Potassium: 3.2 mmol/L — ABNORMAL LOW (ref 3.5–5.1)
Sodium: 138 mmol/L (ref 135–145)
Total Bilirubin: 0.8 mg/dL (ref 0.3–1.2)
Total Protein: 7.6 g/dL (ref 6.5–8.1)

## 2014-12-14 LAB — URINE MICROSCOPIC-ADD ON

## 2014-12-14 LAB — URINALYSIS, ROUTINE W REFLEX MICROSCOPIC
BILIRUBIN URINE: NEGATIVE
Glucose, UA: NEGATIVE mg/dL
HGB URINE DIPSTICK: NEGATIVE
Ketones, ur: NEGATIVE mg/dL
NITRITE: NEGATIVE
PROTEIN: 30 mg/dL — AB
SPECIFIC GRAVITY, URINE: 1.024 (ref 1.005–1.030)
UROBILINOGEN UA: 1 mg/dL (ref 0.0–1.0)
pH: 8 (ref 5.0–8.0)

## 2014-12-14 LAB — LIPASE, BLOOD: Lipase: 18 U/L — ABNORMAL LOW (ref 22–51)

## 2014-12-14 MED ORDER — SODIUM CHLORIDE 0.9 % IV BOLUS (SEPSIS)
1000.0000 mL | Freq: Once | INTRAVENOUS | Status: AC
  Administered 2014-12-14: 1000 mL via INTRAVENOUS

## 2014-12-14 MED ORDER — ONDANSETRON HCL 4 MG/2ML IJ SOLN
4.0000 mg | Freq: Once | INTRAMUSCULAR | Status: AC
  Administered 2014-12-14: 4 mg via INTRAVENOUS
  Filled 2014-12-14: qty 2

## 2014-12-14 MED ORDER — HYDROMORPHONE HCL 1 MG/ML IJ SOLN: 0.5000 mg | Freq: Once | INTRAMUSCULAR | Status: DC

## 2014-12-14 MED ORDER — MORPHINE SULFATE (PF) 4 MG/ML IV SOLN
4.0000 mg | Freq: Once | INTRAVENOUS | Status: AC
  Administered 2014-12-14: 4 mg via INTRAVENOUS
  Filled 2014-12-14: qty 1

## 2014-12-14 NOTE — H&P (Signed)
[name] is an 34 y.o. female.   Chief Complaint: RUQ pain HPI:  Patient is a 34 yo F who presents to the ED with 4 days of worsening RUQ/epigastric pain, nausea and vomiting.  She denies any prior related symptoms.  She does not think she has any relatives with gallbladder disease.  She denies jaundice, itching, diarrhea, constipation, discolored stool/urine.  Her youngest child is 14 months.   She describes the pain as a severe squeezing pain in her upper abdomen, radiating to the right shoulder.    Past Medical History  Diagnosis Date  . Medical history non-contributory     Past Surgical History  Procedure Laterality Date  . Laparoscopic appendectomy  04/05/2012    Procedure: APPENDECTOMY LAPAROSCOPIC;  Surgeon: Madilyn Hook, DO;  Location: [facility];  Service: General;  Laterality: N/A;  . Appendectomy      History reviewed. No pertinent family history. Social History:  reports that she has never smoked. She has never used smokeless tobacco. She reports that she does not drink alcohol or use illicit drugs.  Allergies: No Known Allergies  Medications:  Oral contraceptives Ibuprofen.    Results for orders placed or performed during the hospital encounter of 12/14/14 (from the past 48 hour(s))  Lipase, blood     Status: Abnormal   Collection Time: 12/14/14  5:33 PM  Result Value Ref Range   Lipase 18 (L) 22 - 51 U/L  Comprehensive metabolic panel     Status: Abnormal   Collection Time: 12/14/14  5:33 PM  Result Value Ref Range   Sodium 138 135 - 145 mmol/L   Potassium 3.2 (L) 3.5 - 5.1 mmol/L   Chloride 103 101 - 111 mmol/L   CO2 24 22 - 32 mmol/L   Glucose, Bld 158 (H) 65 - 99 mg/dL   BUN 7 6 - 20 mg/dL   Creatinine, Ser 0.67 0.44 - 1.00 mg/dL   Calcium 8.9 8.9 - 10.3 mg/dL   Total Protein 7.6 6.5 - 8.1 g/dL   Albumin 3.7 3.5 - 5.0 g/dL   AST 45 (H) 15 - 41 U/L   ALT 55 (H) 14 - 54 U/L   Alkaline Phosphatase 117 38 - 126 U/L   Total Bilirubin 0.8 0.3 - 1.2  mg/dL   GFR calc non Af Amer >60 >60 mL/min   GFR calc Af Amer >60 >60 mL/min    Comment: (NOTE) The eGFR has been calculated using the CKD EPI equation. This calculation has not been validated in all clinical situations. eGFR's persistently <60 mL/min signify possible Chronic Kidney Disease.    Anion gap 11 5 - 15  CBC with Differential     Status: Abnormal   Collection Time: 12/14/14  5:33 PM  Result Value Ref Range   WBC 11.4 (H) 4.0 - 10.5 K/uL   RBC 4.13 3.87 - 5.11 MIL/uL   Hemoglobin 12.5 12.0 - 15.0 g/dL   HCT 37.0 36.0 - 46.0 %   MCV 89.6 78.0 - 100.0 fL   MCH 30.3 26.0 - 34.0 pg   MCHC 33.8 30.0 - 36.0 g/dL   RDW 12.7 11.5 - 15.5 %   Platelets 300 150 - 400 K/uL   Neutrophils Relative % 74 43 - 77 %   Neutro Abs 8.5 (H) 1.7 - 7.7 K/uL   Lymphocytes Relative 18 12 - 46 %   Lymphs Abs 2.0 0.7 - 4.0 K/uL   Monocytes Relative 6 3 - 12 %   Monocytes  Absolute 0.6 0.1 - 1.0 K/uL   Eosinophils Relative 2 0 - 5 %   Eosinophils Absolute 0.2 0.0 - 0.7 K/uL   Basophils Relative 0 0 - 1 %   Basophils Absolute 0.0 0.0 - 0.1 K/uL  I-Stat beta hCG blood, ED ([facility], [facility], [facility] only)     Status: None   Collection Time: 12/14/14  5:42 PM  Result Value Ref Range   I-stat hCG, quantitative <5.0 <5 mIU/mL   Comment 3            Comment:   GEST. AGE      CONC.  (mIU/mL)   <=1 WEEK        5 - 50     2 WEEKS       50 - 500     3 WEEKS       100 - 10,000     4 WEEKS     1,000 - 30,000        FEMALE AND NON-PREGNANT FEMALE:     LESS THAN 5 mIU/mL   Urinalysis, Routine w reflex microscopic (not at [facility])     Status: Abnormal   Collection Time: 12/14/14  9:18 PM  Result Value Ref Range   Color, Urine YELLOW YELLOW   APPearance CLOUDY (A) CLEAR   Specific Gravity, Urine 1.024 1.005 - 1.030   pH 8.0 5.0 - 8.0   Glucose, UA NEGATIVE NEGATIVE mg/dL   Hgb urine dipstick NEGATIVE NEGATIVE   Bilirubin Urine NEGATIVE NEGATIVE   Ketones, ur NEGATIVE NEGATIVE mg/dL   Protein, ur 30 (A) NEGATIVE  mg/dL   Urobilinogen, UA 1.0 0.0 - 1.0 mg/dL   Nitrite NEGATIVE NEGATIVE   Leukocytes, UA SMALL (A) NEGATIVE  Urine microscopic-add on     Status: Abnormal   Collection Time: 12/14/14  9:18 PM  Result Value Ref Range   Squamous Epithelial / LPF MANY (A) RARE   WBC, UA 3-6 <3 WBC/hpf   Bacteria, UA RARE RARE   US Abdomen Limited Ruq  12/14/2014   CLINICAL DATA:  Right upper quadrant pain and vomiting for 4 days.  EXAM: US ABDOMEN LIMITED - RIGHT UPPER QUADRANT  COMPARISON:  CT abdomen and pelvis 04/04/2012  FINDINGS: Gallbladder:  Nonmobile gallstone in the gallbladder neck measuring about 2.5 cm diameter. Gallbladder is mildly distended and there is pericholecystic edema. Gallbladder wall thickness is upper limits of normal. Murphy's sign is negative.  Common bile duct:  Diameter: 5 mm, normal  Liver:  Diffusely increased parenchymal echotexture suggesting fatty infiltration. No focal lesions identified.  IMPRESSION: Nonmobile gallstone in the gallbladder neck with mild gallbladder distention and mild pericholecystic edema. Murphy's sign is negative. Findings are nonspecific but suggest cholecystitis.   Electronically Signed   By: Lucienne Capers M.D.   On: 12/14/2014 22:19    Review of Systems  Constitutional: Negative.   HENT: Negative.   Eyes: Negative.   Respiratory: Negative.   Cardiovascular: Negative.   Gastrointestinal: Positive for nausea, vomiting and abdominal pain.       Bloating  Genitourinary: Negative.   Musculoskeletal:       Right shoulder pain  Skin: Negative.   Neurological: Negative.   Endo/Heme/Allergies: Negative.   Psychiatric/Behavioral: Negative.     Blood pressure 107/66, pulse 83, temperature 98.7 F (37.1 C), temperature source Oral, resp. rate 17, SpO2 99 %, currently breastfeeding. Physical Exam  Constitutional: She is oriented to person, place, and time. She appears well-developed and well-nourished. She appears distressed.  HENT:  Head:  Normocephalic and atraumatic.  Nose: Nose normal.  Mouth/Throat: Oropharynx is clear and moist.  Eyes: Conjunctivae are normal. Pupils are equal, round, and reactive to light. No scleral icterus.  Neck: Normal range of motion. Neck supple. No tracheal deviation present. No thyromegaly present.  Cardiovascular: Normal rate, regular rhythm and intact distal pulses.   Respiratory: Effort normal. No respiratory distress. She exhibits no tenderness.  GI: Soft. She exhibits distension. There is tenderness (RUQ/epigastric). There is guarding (voluntary). There is no rebound.  Musculoskeletal: Normal range of motion. She exhibits no edema.  Neurological: She is alert and oriented to person, place, and time. Coordination normal.  Skin: Skin is warm and dry. No rash noted. She is not diaphoretic. No erythema. No pallor.  Psychiatric: She has a normal mood and affect. Her behavior is normal. Judgment and thought content normal.     Assessment/Plan Acute calculous cholecystitis  IV Fluid IV antibiotics NPO after midnight To OR with Dr. Grandville Silos for lap chole with IOC.   CBD slightly dilated on ultrasound, but minimal elevation in LFTs.  Lavonna Lampron 12/14/2014, 11:31 PM

## 2014-12-14 NOTE — ED Provider Notes (Signed)
CSN: [id number]     Arrival date & time 12/14/14  1701 History   First MD Initiated Contact with Patient 12/14/14 2045     Chief Complaint  Patient presents with  . Abdominal Pain  . Emesis     (Consider location/radiation/quality/duration/timing/severity/associated sxs/prior Treatment) Patient is a 34 y.o. female presenting with abdominal pain and vomiting. The history is provided by the patient, the spouse and a friend. The history is limited by a language barrier. A language interpreter was used (Language interpretation with a close family friend spanish to [place]).  Abdominal Pain Pain quality: sharp and squeezing   Pain radiates to:  R shoulder Pain severity:  Severe Duration:  4 days Timing:  Intermittent Progression:  Worsening Chronicity:  New Context: eating   Context: not recent illness, not sick contacts and not suspicious food intake   Relieved by:  Nothing Worsened by:  Deep breathing Associated symptoms: nausea and vomiting   Associated symptoms: no chest pain, no chills, no constipation, no cough, no diarrhea, no dysuria, no fever and no shortness of breath   Risk factors: not pregnant   Emesis Associated symptoms: abdominal pain   Associated symptoms: no chills and no diarrhea      Patient is a 34 year old female, otherwise healthy, who presents to the emergency department with 4 days of intermittent abdominal pain with associated vomiting. Her abdominal pain is located in her right upper quadrant, is worse after eating, and has gradually increased over the past 4 days.  Patient denies any other associated symptoms, denies chest pain, shortness of breath, diarrhea, fever, sick contacts.  She last ate around 2 PM. She does not have any known allergies.  She has a history of appendectomy, no other abdominal surgeries.  Past Medical History  Diagnosis Date  . Medical history non-contributory    Past Surgical History  Procedure Laterality Date  . Laparoscopic  appendectomy  04/05/2012    Procedure: APPENDECTOMY LAPAROSCOPIC;  Surgeon: Lodema Pilot, DO;  Location: [facility];  Service: General;  Laterality: N/A;  . Appendectomy     History reviewed. No pertinent family history. Social History  Substance Use Topics  . Smoking status: Never Smoker   . Smokeless tobacco: Never Used  . Alcohol Use: No   OB History    Gravida Para Term Preterm AB TAB SAB Ectopic Multiple Living   5 5 3 2  0 0 0 0 0 4     Review of Systems  Constitutional: Negative.  Negative for fever and chills.  HENT: Negative.   Respiratory: Negative.  Negative for cough, shortness of breath and wheezing.   Cardiovascular: Negative for chest pain, palpitations and leg swelling.  Gastrointestinal: Positive for nausea, vomiting, abdominal pain and abdominal distention. Negative for diarrhea, constipation, blood in stool, anal bleeding and rectal pain.  Endocrine: Negative.   Genitourinary: Negative for dysuria, flank pain, difficulty urinating, menstrual problem and pelvic pain.  Musculoskeletal: Negative.   Skin: Negative.   Neurological: Negative.   Psychiatric/Behavioral: Negative.     Allergies  Review of patient's allergies indicates no known allergies.  Home Medications   Prior to Admission medications   Medication Sig Start Date End Date Taking? Authorizing Provider  ibuprofen (ADVIL,MOTRIN) 200 MG tablet Take 200 mg by mouth every 6 (six) hours as needed for headache, mild pain, moderate pain or cramping.   Yes Historical Provider, MD  ibuprofen (ADVIL,MOTRIN) 600 MG tablet Take 1 tablet (600 mg total) by mouth every 6 (six) hours. Patient not  taking: Reported on 12/14/2014 10/09/13   Aviva Signs, CNM  ibuprofen (ADVIL,MOTRIN) 600 MG tablet Take 1 tablet (600 mg total) by mouth every 8 (eight) hours as needed. Patient not taking: Reported on 12/14/2014 03/24/14   Ambrose Finland, NP  norgestimate-ethinyl estradiol (ORTHO-CYCLEN,SPRINTEC,PREVIFEM) 0.25-35 MG-MCG tablet  Take 1 tablet by mouth daily. Patient not taking: Reported on 03/24/2014 11/13/13   Willodean Rosenthal, MD   BP 112/76 mmHg  Pulse 87  Temp(Src) 98.7 F (37.1 C) (Oral)  Resp 17  SpO2 100% Physical Exam  Constitutional: She is oriented to person, place, and time. She appears well-developed and well-nourished. No distress.  HENT:  Head: Normocephalic and atraumatic.  Nose: Nose normal.  Mouth/Throat: Oropharynx is clear and moist. No oropharyngeal exudate.  Eyes: Conjunctivae and EOM are normal. Pupils are equal, round, and reactive to light. Right eye exhibits no discharge. Left eye exhibits no discharge. No scleral icterus.  Neck: Normal range of motion. No JVD present. No tracheal deviation present. No thyromegaly present.  Cardiovascular: Normal rate, regular rhythm, normal heart sounds and intact distal pulses.  Exam reveals no gallop and no friction rub.   No murmur heard. Pulmonary/Chest: Effort normal and breath sounds normal. No respiratory distress. She has no wheezes. She has no rales. She exhibits no tenderness.  Abdominal: Soft. Bowel sounds are normal. She exhibits no distension and no mass. There is tenderness in the right upper quadrant and epigastric area. There is guarding and CVA tenderness. There is no rigidity, no rebound, no tenderness at McBurney's point and negative Murphy's sign.  Tenderness to palpation right upper quadrant and epigastrium, no inspiratory arrest with deep palpation of the right upper quadrant  Musculoskeletal: Normal range of motion. She exhibits no edema or tenderness.  Lymphadenopathy:    She has no cervical adenopathy.  Neurological: She is alert and oriented to person, place, and time. She has normal reflexes. No cranial nerve deficit. She exhibits normal muscle tone. Coordination normal.  Skin: Skin is warm and dry. No rash noted. She is not diaphoretic. No erythema. No pallor.  Psychiatric: She has a normal mood and affect. Her behavior is  normal. Judgment and thought content normal.  Nursing note and vitals reviewed.   ED Course  Procedures (including critical care time) Labs Review Labs Reviewed  LIPASE, BLOOD - Abnormal; Notable for the following:    Lipase 18 (*)    All other components within normal limits  COMPREHENSIVE METABOLIC PANEL - Abnormal; Notable for the following:    Potassium 3.2 (*)    Glucose, Bld 158 (*)    AST 45 (*)    ALT 55 (*)    All other components within normal limits  CBC WITH DIFFERENTIAL/PLATELET - Abnormal; Notable for the following:    WBC 11.4 (*)    Neutro Abs 8.5 (*)    All other components within normal limits  URINALYSIS, ROUTINE W REFLEX MICROSCOPIC (NOT AT [facility])  I-STAT BETA HCG BLOOD, ED ([facility], [facility], [facility] ONLY)    Imaging Review No results found. I have personally reviewed and evaluated these images and lab results as part of my medical decision-making.   EKG Interpretation None      MDM   Final diagnoses:  None    Pt with RUQ pain worse after eating The patient does not have any active vomiting at this time, is tender right upper quadrant and right CVA tenderness Pt last ate approximately 6-7 hours ago, pt is not pregnant, prior appendectomy  Workup was pertinent for leukocytosis, hypo-kalemia, mildly elevated LFTs Ultrasound pertinent for nonmobile gallstone in gallbladder neck with gallbladder distention and pericholecystic edema  General surgery consulted, Dr. Donell Beers will admit the patient for acute cholecystitis, with plans to take pt to surgery in the AM.       Danelle Berry, PA-C 12/16/14 0417  Lavera Guise, MD 12/16/14 9147  Lavera Guise, MD 12/16/14 1918

## 2014-12-14 NOTE — ED Notes (Signed)
Pt sts upper abd pain to right side x 4 days with vomiting

## 2014-12-15 ENCOUNTER — Encounter (HOSPITAL_COMMUNITY): Payer: Self-pay

## 2014-12-15 ENCOUNTER — Encounter (HOSPITAL_COMMUNITY): Admission: EM | Disposition: A | Discharge status: Home / Self Care | Payer: Self-pay | Source: Home / Self Care

## 2014-12-15 ENCOUNTER — Inpatient Hospital Stay (HOSPITAL_COMMUNITY): Payer: Medicaid Other | Admitting: Anesthesiology

## 2014-12-15 HISTORY — PX: CHOLECYSTECTOMY: SHX55

## 2014-12-15 LAB — COMPREHENSIVE METABOLIC PANEL
ALBUMIN: 3.4 g/dL — AB (ref 3.5–5.0)
ALK PHOS: 126 U/L (ref 38–126)
ALT: 66 U/L — ABNORMAL HIGH (ref 14–54)
ANION GAP: 7 (ref 5–15)
AST: 60 U/L — ABNORMAL HIGH (ref 15–41)
BILIRUBIN TOTAL: 0.6 mg/dL (ref 0.3–1.2)
BUN: <5 mg/dL — ABNORMAL LOW (ref 6–20)
CALCIUM: 8.6 mg/dL — AB (ref 8.9–10.3)
CO2: 24 mmol/L (ref 22–32)
Chloride: 103 mmol/L (ref 101–111)
Creatinine, Ser: 0.47 mg/dL (ref 0.44–1.00)
GFR calc non Af Amer: >60 mL/min (ref >60)
GLUCOSE: 140 mg/dL — AB (ref 65–99)
POTASSIUM: 3.9 mmol/L (ref 3.5–5.1)
Sodium: 134 mmol/L — ABNORMAL LOW (ref 135–145)
TOTAL PROTEIN: 7.2 g/dL (ref 6.5–8.1)

## 2014-12-15 LAB — SURGICAL PCR SCREEN
MRSA, PCR: NEGATIVE
STAPHYLOCOCCUS AUREUS: NEGATIVE

## 2014-12-15 SURGERY — LAPAROSCOPIC CHOLECYSTECTOMY
Anesthesia: General | Site: Abdomen

## 2014-12-15 MED ORDER — DEXAMETHASONE SODIUM PHOSPHATE 4 MG/ML IJ SOLN
INTRAMUSCULAR | Status: AC
  Filled 2014-12-15: qty 2

## 2014-12-15 MED ORDER — MIDAZOLAM HCL 5 MG/5ML IJ SOLN
INTRAMUSCULAR | Status: DC | PRN
  Administered 2014-12-15: 2 mg via INTRAVENOUS

## 2014-12-15 MED ORDER — HYDROMORPHONE HCL 1 MG/ML IJ SOLN: 0.5000 mg | INTRAMUSCULAR | Status: DC | PRN

## 2014-12-15 MED ORDER — ONDANSETRON 4 MG PO TBDP: 4.0000 mg | ORAL_TABLET | Freq: Four times a day (QID) | ORAL | Status: DC | PRN

## 2014-12-15 MED ORDER — LACTATED RINGERS IV SOLN
INTRAVENOUS | Status: DC
  Administered 2014-12-15: 12:00:00 via INTRAVENOUS

## 2014-12-15 MED ORDER — SUGAMMADEX SODIUM 500 MG/5ML IV SOLN
INTRAVENOUS | Status: DC | PRN
  Administered 2014-12-15: 200 mg via INTRAVENOUS

## 2014-12-15 MED ORDER — SIMETHICONE 80 MG PO CHEW: 40.0000 mg | CHEWABLE_TABLET | Freq: Four times a day (QID) | ORAL | Status: DC | PRN

## 2014-12-15 MED ORDER — FENTANYL CITRATE (PF) 250 MCG/5ML IJ SOLN
INTRAMUSCULAR | Status: AC
  Filled 2014-12-15: qty 5

## 2014-12-15 MED ORDER — ROCURONIUM BROMIDE 100 MG/10ML IV SOLN
INTRAVENOUS | Status: DC | PRN
  Administered 2014-12-15: 50 mg via INTRAVENOUS

## 2014-12-15 MED ORDER — DEXTROSE 5 % IV SOLN
2.0000 g | Freq: Every day | INTRAVENOUS | Status: AC
  Administered 2014-12-15: 2 g via INTRAVENOUS
  Filled 2014-12-15: qty 2

## 2014-12-15 MED ORDER — IBUPROFEN 600 MG PO TABS
600.0000 mg | ORAL_TABLET | Freq: Four times a day (QID) | ORAL | Status: DC
  Administered 2014-12-15 – 2014-12-16 (×3): 600 mg via ORAL
  Filled 2014-12-15 (×2): qty 1

## 2014-12-15 MED ORDER — PROPOFOL 10 MG/ML IV BOLUS
INTRAVENOUS | Status: DC | PRN
  Administered 2014-12-15: 30 mg via INTRAVENOUS
  Administered 2014-12-15: 100 mg via INTRAVENOUS
  Administered 2014-12-15: 30 mg via INTRAVENOUS

## 2014-12-15 MED ORDER — CEFTRIAXONE SODIUM 2 G IJ SOLR
2.0000 g | Freq: Every day | INTRAMUSCULAR | Status: DC
Start: 2014-12-15 — End: 2014-12-15
  Administered 2014-12-15: 2 g via INTRAVENOUS
  Filled 2014-12-15 (×2): qty 2

## 2014-12-15 MED ORDER — HYDROMORPHONE HCL 1 MG/ML IJ SOLN
INTRAMUSCULAR | Status: AC
  Administered 2014-12-15: 0.5 mg via INTRAVENOUS
  Filled 2014-12-15: qty 1

## 2014-12-15 MED ORDER — ONDANSETRON HCL 4 MG/2ML IJ SOLN
INTRAMUSCULAR | Status: DC | PRN
  Administered 2014-12-15: 4 mg via INTRAVENOUS

## 2014-12-15 MED ORDER — SUGAMMADEX SODIUM 200 MG/2ML IV SOLN
INTRAVENOUS | Status: AC
  Filled 2014-12-15: qty 2

## 2014-12-15 MED ORDER — DIPHENHYDRAMINE HCL 25 MG PO CAPS: 25.0000 mg | ORAL_CAPSULE | Freq: Four times a day (QID) | ORAL | Status: DC | PRN

## 2014-12-15 MED ORDER — ONDANSETRON HCL 4 MG/2ML IJ SOLN
4.0000 mg | Freq: Four times a day (QID) | INTRAMUSCULAR | Status: DC | PRN
  Administered 2014-12-15: 4 mg via INTRAVENOUS
  Filled 2014-12-15: qty 2

## 2014-12-15 MED ORDER — MEPERIDINE HCL 25 MG/ML IJ SOLN: 6.2500 mg | INTRAMUSCULAR | Status: DC | PRN

## 2014-12-15 MED ORDER — ESMOLOL HCL 10 MG/ML IV SOLN
INTRAVENOUS | Status: DC | PRN
  Administered 2014-12-15: 20 mg via INTRAVENOUS
  Administered 2014-12-15: 30 mg via INTRAVENOUS
  Administered 2014-12-15: 20 mg via INTRAVENOUS
  Administered 2014-12-15: 10 mg via INTRAVENOUS

## 2014-12-15 MED ORDER — SODIUM CHLORIDE 0.9 % IR SOLN
Status: DC | PRN
  Administered 2014-12-15: 1

## 2014-12-15 MED ORDER — LACTATED RINGERS IV SOLN
INTRAVENOUS | Status: DC | PRN
  Administered 2014-12-15 (×2): via INTRAVENOUS

## 2014-12-15 MED ORDER — HEMOSTATIC AGENTS (NO CHARGE) OPTIME
TOPICAL | Status: DC | PRN
  Administered 2014-12-15: 1 via TOPICAL

## 2014-12-15 MED ORDER — OXYCODONE HCL 5 MG PO TABS
ORAL_TABLET | ORAL | Status: AC
  Filled 2014-12-15: qty 2

## 2014-12-15 MED ORDER — 0.9 % SODIUM CHLORIDE (POUR BTL) OPTIME
TOPICAL | Status: DC | PRN
  Administered 2014-12-15: 1000 mL

## 2014-12-15 MED ORDER — DEXAMETHASONE SODIUM PHOSPHATE 4 MG/ML IJ SOLN
INTRAMUSCULAR | Status: DC | PRN
  Administered 2014-12-15: 8 mg via INTRAVENOUS

## 2014-12-15 MED ORDER — HYDROMORPHONE HCL 1 MG/ML IJ SOLN
0.5000 mg | INTRAMUSCULAR | Status: DC | PRN
  Administered 2014-12-15 (×3): 1 mg via INTRAVENOUS
  Filled 2014-12-15 (×3): qty 1

## 2014-12-15 MED ORDER — KCL IN DEXTROSE-NACL 20-5-0.45 MEQ/L-%-% IV SOLN
INTRAVENOUS | Status: DC
  Administered 2014-12-15 – 2014-12-16 (×3): via INTRAVENOUS
  Filled 2014-12-15 (×3): qty 1000

## 2014-12-15 MED ORDER — ONDANSETRON HCL 4 MG/2ML IJ SOLN
INTRAMUSCULAR | Status: AC
  Filled 2014-12-15: qty 2

## 2014-12-15 MED ORDER — ACETAMINOPHEN 325 MG PO TABS: 650.0000 mg | ORAL_TABLET | Freq: Four times a day (QID) | ORAL | Status: DC | PRN

## 2014-12-15 MED ORDER — FENTANYL CITRATE (PF) 100 MCG/2ML IJ SOLN
INTRAMUSCULAR | Status: DC | PRN
  Administered 2014-12-15 (×2): 50 ug via INTRAVENOUS
  Administered 2014-12-15: 100 ug via INTRAVENOUS
  Administered 2014-12-15: 50 ug via INTRAVENOUS

## 2014-12-15 MED ORDER — METHOCARBAMOL 500 MG PO TABS: 500.0000 mg | ORAL_TABLET | Freq: Four times a day (QID) | ORAL | Status: DC | PRN

## 2014-12-15 MED ORDER — LIDOCAINE HCL (CARDIAC) 20 MG/ML IV SOLN
INTRAVENOUS | Status: DC | PRN
  Administered 2014-12-15: 100 mg via INTRAVENOUS

## 2014-12-15 MED ORDER — OXYCODONE HCL 5 MG PO TABS
5.0000 mg | ORAL_TABLET | ORAL | Status: DC | PRN
  Administered 2014-12-15: 10 mg via ORAL
  Filled 2014-12-15 (×2): qty 2

## 2014-12-15 MED ORDER — ACETAMINOPHEN 650 MG RE SUPP: 650.0000 mg | Freq: Four times a day (QID) | RECTAL | Status: DC | PRN

## 2014-12-15 MED ORDER — BUPIVACAINE-EPINEPHRINE (PF) 0.25% -1:200000 IJ SOLN
INTRAMUSCULAR | Status: AC
  Filled 2014-12-15: qty 30

## 2014-12-15 MED ORDER — DIPHENHYDRAMINE HCL 50 MG/ML IJ SOLN: 25.0000 mg | Freq: Four times a day (QID) | INTRAMUSCULAR | Status: DC | PRN

## 2014-12-15 MED ORDER — BUPIVACAINE-EPINEPHRINE 0.25% -1:200000 IJ SOLN
INTRAMUSCULAR | Status: DC | PRN
  Administered 2014-12-15: 19 mL

## 2014-12-15 MED ORDER — MIDAZOLAM HCL 2 MG/2ML IJ SOLN
INTRAMUSCULAR | Status: AC
  Filled 2014-12-15: qty 4

## 2014-12-15 MED ORDER — SENNOSIDES-DOCUSATE SODIUM 8.6-50 MG PO TABS: 1.0000 | ORAL_TABLET | Freq: Every evening | ORAL | Status: DC | PRN

## 2014-12-15 MED ORDER — NORGESTIMATE-ETH ESTRADIOL 0.25-35 MG-MCG PO TABS: 1.0000 | ORAL_TABLET | Freq: Every day | ORAL | Status: DC

## 2014-12-15 MED ORDER — ESMOLOL HCL 10 MG/ML IV SOLN
INTRAVENOUS | Status: AC
  Filled 2014-12-15: qty 10

## 2014-12-15 MED ORDER — LIDOCAINE HCL (CARDIAC) 20 MG/ML IV SOLN
INTRAVENOUS | Status: AC
  Filled 2014-12-15: qty 5

## 2014-12-15 MED ORDER — HYDROMORPHONE HCL 1 MG/ML IJ SOLN
0.2500 mg | INTRAMUSCULAR | Status: DC | PRN
  Administered 2014-12-15 (×4): 0.5 mg via INTRAVENOUS

## 2014-12-15 MED ORDER — ONDANSETRON HCL 4 MG/2ML IJ SOLN: 4.0000 mg | Freq: Once | INTRAMUSCULAR | Status: DC | PRN

## 2014-12-15 SURGICAL SUPPLY — 45 items
APPLIER CLIP 5 13 M/L LIGAMAX5 (MISCELLANEOUS) ×4
BLADE SURG ROTATE 9660 (MISCELLANEOUS) IMPLANT
CANISTER SUCTION 2500CC (MISCELLANEOUS) ×4 IMPLANT
CHLORAPREP W/TINT 26ML (MISCELLANEOUS) ×4 IMPLANT
CLIP APPLIE 5 13 M/L LIGAMAX5 (MISCELLANEOUS) ×2 IMPLANT
COVER MAYO STAND STRL (DRAPES) ×4 IMPLANT
COVER SURGICAL LIGHT HANDLE (MISCELLANEOUS) ×4 IMPLANT
DRAPE C-ARM 42X72 X-RAY (DRAPES) ×4 IMPLANT
ELECT REM PT RETURN 9FT ADLT (ELECTROSURGICAL) ×4
ELECTRODE REM PT RTRN 9FT ADLT (ELECTROSURGICAL) ×2 IMPLANT
FILTER SMOKE EVAC LAPAROSHD (FILTER) ×4 IMPLANT
GLOVE BIO SURGEON STRL SZ7.5 (GLOVE) ×4 IMPLANT
GLOVE BIO SURGEON STRL SZ8 (GLOVE) ×4 IMPLANT
GLOVE BIOGEL PI IND STRL 7.0 (GLOVE) ×2 IMPLANT
GLOVE BIOGEL PI IND STRL 7.5 (GLOVE) ×2 IMPLANT
GLOVE BIOGEL PI IND STRL 8 (GLOVE) ×4 IMPLANT
GLOVE BIOGEL PI INDICATOR 7.0 (GLOVE) ×2
GLOVE BIOGEL PI INDICATOR 7.5 (GLOVE) ×2
GLOVE BIOGEL PI INDICATOR 8 (GLOVE) ×4
GLOVE ECLIPSE 6.5 STRL STRAW (GLOVE) ×4 IMPLANT
GLOVE ECLIPSE 7.0 STRL STRAW (GLOVE) ×4 IMPLANT
GOWN STRL REUS W/ TWL LRG LVL3 (GOWN DISPOSABLE) ×8 IMPLANT
GOWN STRL REUS W/ TWL XL LVL3 (GOWN DISPOSABLE) ×2 IMPLANT
GOWN STRL REUS W/TWL LRG LVL3 (GOWN DISPOSABLE) ×8
GOWN STRL REUS W/TWL XL LVL3 (GOWN DISPOSABLE) ×2
KIT BASIN OR (CUSTOM PROCEDURE TRAY) ×4 IMPLANT
KIT ROOM TURNOVER OR (KITS) ×4 IMPLANT
LIQUID BAND (GAUZE/BANDAGES/DRESSINGS) ×4 IMPLANT
NEEDLE 22X1 1/2 (OR ONLY) (NEEDLE) ×4 IMPLANT
NS IRRIG 1000ML POUR BTL (IV SOLUTION) ×4 IMPLANT
PAD ARMBOARD 7.5X6 YLW CONV (MISCELLANEOUS) ×4 IMPLANT
POUCH RETRIEVAL ECOSAC 10 (ENDOMECHANICALS) ×2 IMPLANT
POUCH RETRIEVAL ECOSAC 10MM (ENDOMECHANICALS) ×2
SCISSORS LAP 5X35 DISP (ENDOMECHANICALS) ×4 IMPLANT
SET CHOLANGIOGRAPH 5 50 .035 (SET/KITS/TRAYS/PACK) ×4 IMPLANT
SET IRRIG TUBING LAPAROSCOPIC (IRRIGATION / IRRIGATOR) ×4 IMPLANT
SLEEVE ENDOPATH XCEL 5M (ENDOMECHANICALS) ×8 IMPLANT
SPECIMEN JAR SMALL (MISCELLANEOUS) ×4 IMPLANT
SUT VIC AB 4-0 PS2 27 (SUTURE) ×4 IMPLANT
TOWEL OR 17X24 6PK STRL BLUE (TOWEL DISPOSABLE) ×4 IMPLANT
TOWEL OR 17X26 10 PK STRL BLUE (TOWEL DISPOSABLE) ×4 IMPLANT
TRAY LAPAROSCOPIC MC (CUSTOM PROCEDURE TRAY) ×4 IMPLANT
TROCAR XCEL BLUNT TIP 100MML (ENDOMECHANICALS) ×4 IMPLANT
TROCAR XCEL NON-BLD 5MMX100MML (ENDOMECHANICALS) ×4 IMPLANT
TUBING INSUFFLATION (TUBING) ×4 IMPLANT

## 2014-12-15 NOTE — Transfer of Care (Signed)
Immediate Anesthesia Transfer of Care Note  Patient: [name]  Procedure(s) Performed: Procedure(s): LAPAROSCOPIC CHOLECYSTECTOMY, attempted cholangiogram  Patient Location: PACU  Anesthesia Type:General  Level of Consciousness: awake and alert   Airway & Oxygen Therapy: Patient Spontanous Breathing and Patient connected to nasal cannula oxygen  Post-op Assessment: Report given to RN, Post -op Vital signs reviewed and stable and Patient moving all extremities X 4  Post vital signs: Reviewed and stable  Last Vitals:  Filed Vitals:   12/15/14 1113  BP: 119/72  Pulse: 117  Temp: 37.5 C  Resp: 18    Complications: No apparent anesthesia complications

## 2014-12-15 NOTE — Progress Notes (Signed)
Report called to Bridgette in short stay.

## 2014-12-15 NOTE — Care Management Note (Signed)
Case Management Note  Patient Details  Name: [name] MRN: [id number] Date of Birth: [birth year]  Subjective/Objective:                    Action/Plan:  UR completed  Expected Discharge Date:                  Expected Discharge Plan:  Home/Self Care  In-House Referral:     Discharge planning Services     Post Acute Care Choice:    Choice offered to:     DME Arranged:    DME Agency:     HH Arranged:    HH Agency:     Status of Service:  In process, will continue to follow  Medicare Important Message Given:    Date Medicare IM Given:    Medicare IM give by:    Date Additional Medicare IM Given:    Additional Medicare Important Message give by:     If discussed at Long Length of Stay Meetings, dates discussed:    Additional Comments:  Kingsley Plan, RN 12/15/2014, 8:00 AM

## 2014-12-15 NOTE — Op Note (Signed)
12/14/2014 - 12/15/2014  1:18 PM  PATIENT:  [name]  34 y.o. female  PRE-OPERATIVE DIAGNOSIS:  Acute cholecystitis  POST-OPERATIVE DIAGNOSIS:  Acute cholecystitis with empyema of the gallbladder  PROCEDURE:  Procedure(s): LAPAROSCOPIC CHOLECYSTECTOMY ATTEMPTED CHOLANGIOGRAM  SURGEON:  Surgeon(s): Violeta Gelinas, MD  ASSISTANTS: Barnetta Chapel, PAC, Magnus Ivan, RNFA   ANESTHESIA:   local and general  EBL:  Total I/O In: 1200 [I.V.:1200] Out: 30 [Blood:30]  BLOOD ADMINISTERED:none  DRAINS: none   SPECIMEN:  Excision  DISPOSITION OF SPECIMEN:  PATHOLOGY  COUNTS:  YES  DICTATION: .Dragon Dictation Findings: Significant acute cholecystitis with empyema of the gallbladder, unable to pass cholangiocatheter into cystic duct  [name] is brought for laparoscopic cholecystectomy and cleaned.Marland Kitchen She was identified in the preop holding area. Informed consent was obtained from her in [place]. She is on IV antibiotics. She was brought to the operating room and general endotracheal anesthesia was administered by the anesthesia staff. Her abdomen was prepped and draped in a sterile fashion. Time out procedure was performed.The infraumbilical region was infiltrated with local. Infraumbilical incision was made. Subcutaneous tissues were dissected down revealing the anterior fascia. This was divided sharply along the midline. Peritoneal cavity was entered under direct vision without complication. A 0 Vicryl pursestring was placed around the fascial opening. Hassan trocar was inserted into the abdomen. The abdomen was insufflated with carbon dioxide in standard fashion. Under direct vision a 5 mm epigastric and 5 mm lateral port 2 were placed. Local was used at each port site. Laparoscopic exploration revealed a severely inflamed gallbladder. It was very edematous. During grasping it ruptured at the apex and the bile was completely clear. This was evacuated. The dome was retracted superior  medially. The infundibulum was retracted inferolaterally. Dissection began laterally and progressed medially easily identified the cystic duct and an anterior branch of the cystic artery. Anterior branch of the cystic artery was clipped twice proximally, once distally and divided. Cystic duct was further dissected until critical view was obtained between the infundibulum, the cystic duct, and the liver. A clip was placed on the infundibular cystic duct junction. A nick was made in the cystic duct but I was unable to thread the cholangiocatheter successfully. Due to the clinical situation, cholangiogram was then abandoned. 3 clips were placed proximally on the cystic duct and it was divided. The main branch of the cystic artery was dissected, clipped twice proximally, once distally and divided. Gallbladder was taken off the liver bed with Bovie cautery achieving excellent hemostasis along the way. Gallbladder was placed in a bag and removed from the infraumbilical port site. Liver bed was copiously irrigated. It was further cauterized for hemostasis and a piece of Surgicel snow was applied. Clips remain in good position. Irrigation fluid was evacuated and was clear. Ports were removed under direct vision. Pneumoperitoneum was released. Informed local fascia was closed by tying the pursestring suture. All 4 wounds were copiously irrigated and the skin of each was closed with running 4-0 Monocryl subcuticular and covered with liquid band. All counts were correct. Patient tolerated procedure well apparent complication was taken recovery in stable condition.  PATIENT DISPOSITION:  PACU - hemodynamically stable.   Delay start of Pharmacological VTE agent (>24hrs) due to surgical blood loss or risk of bleeding:  no  Violeta Gelinas, MD, MPH, FACS Pager: [phone]  9/7/20161:18 PM

## 2014-12-15 NOTE — Anesthesia Procedure Notes (Signed)
Procedure Name: Intubation Date/Time: 12/15/2014 12:14 PM Performed by: Rise Patience T Pre-anesthesia Checklist: Patient identified, Suction available, Patient being monitored and Emergency Drugs available Patient Re-evaluated:Patient Re-evaluated prior to inductionOxygen Delivery Method: Circle system utilized Preoxygenation: Pre-oxygenation with 100% oxygen Intubation Type: IV induction Ventilation: Mask ventilation without difficulty Laryngoscope Size: Miller and 2 Grade View: Grade I Tube type: Oral Tube size: 7.5 mm Number of attempts: 1 Airway Equipment and Method: Stylet Placement Confirmation: ETT inserted through vocal cords under direct vision,  positive ETCO2 and breath sounds checked- equal and bilateral Secured at: 22 cm Tube secured with: Tape Dental Injury: Teeth and Oropharynx as per pre-operative assessment

## 2014-12-15 NOTE — Progress Notes (Signed)
Day of Surgery  Subjective: Pain better  Objective: Vital signs in last 24 hours: Temp:  [98.2 F (36.8 C)-98.7 F (37.1 C)] 98.3 F (36.8 C) (09/07 0458) Pulse Rate:  [83-94] 94 (09/07 0458) Resp:  [16-18] 18 (09/07 0458) BP: (101-118)/(63-79) 117/79 mmHg (09/07 0458) SpO2:  [99 %-100 %] 100 % (09/07 0458) Weight:  [62.6 kg (138 lb 0.1 oz)] 62.6 kg (138 lb 0.1 oz) (09/07 0021) Last BM Date: 12/14/14  Intake/Output from previous day: 09/06 0701 - 09/07 0700 In: 383.3 [I.V.:333.3; IV Piggyback:50] Out: -  Intake/Output this shift:    General appearance: alert and cooperative Resp: clear to auscultation bilaterally Cardio: S1, S2 normal GI: soft, min TTP RUQ Neurologic: Mental status: Alert, oriented, thought content appropriate  Lab Results:   Recent Labs  12/14/14 1733  WBC 11.4*  HGB 12.5  HCT 37.0  PLT 300   BMET  Recent Labs  12/14/14 1733  NA 138  K 3.2*  CL 103  CO2 24  GLUCOSE 158*  BUN 7  CREATININE 0.67  CALCIUM 8.9   PT/INR No results for input(s): LABPROT, INR in the last 72 hours. ABG No results for input(s): PHART, HCO3 in the last 72 hours.  Invalid input(s): PCO2, PO2  Studies/Results: US Abdomen Limited Ruq  12/14/2014   CLINICAL DATA:  Right upper quadrant pain and vomiting for 4 days.  EXAM: US ABDOMEN LIMITED - RIGHT UPPER QUADRANT  COMPARISON:  CT abdomen and pelvis 04/04/2012  FINDINGS: Gallbladder:  Nonmobile gallstone in the gallbladder neck measuring about 2.5 cm diameter. Gallbladder is mildly distended and there is pericholecystic edema. Gallbladder wall thickness is upper limits of normal. Murphy's sign is negative.  Common bile duct:  Diameter: 5 mm, normal  Liver:  Diffusely increased parenchymal echotexture suggesting fatty infiltration. No focal lesions identified.  IMPRESSION: Nonmobile gallstone in the gallbladder neck with mild gallbladder distention and mild pericholecystic edema. Murphy's sign is negative. Findings are  nonspecific but suggest cholecystitis.   Electronically Signed   By: Burman Nieves M.D.   On: 12/14/2014 22:19    Anti-infectives: Anti-infectives    Start     Dose/Rate Route Frequency Ordered Stop   12/15/14 0100  cefTRIAXone (ROCEPHIN) 2 g in dextrose 5 % 50 mL IVPB     2 g 100 mL/hr over 30 Minutes Intravenous Daily at bedtime 12/15/14 0018        Assessment/Plan: Cholecystitis with cholelithiasis - for lap chole with IOC today. Procedure, risks, and benefits D/W her in Spanish and she agrees.  LOS: 1 day    Tymarion Everard E 12/15/2014

## 2014-12-15 NOTE — Anesthesia Postprocedure Evaluation (Signed)
Anesthesia Post Note  Patient: [name]  Procedure(s) Performed: Procedure(s): LAPAROSCOPIC CHOLECYSTECTOMY, attempted cholangiogram  Anesthesia type: general  Patient location: PACU  Post pain: Pain level controlled  Post assessment: Patient's Cardiovascular Status Stable  Last Vitals:  Filed Vitals:   12/15/14 1508  BP: 104/63  Pulse: 107  Temp: 37.4 C  Resp: 12    Post vital signs: Reviewed and stable  Level of consciousness: sedated  Complications: No apparent anesthesia complications

## 2014-12-15 NOTE — Anesthesia Preprocedure Evaluation (Addendum)
Anesthesia Evaluation  Patient identified by MRN, date of birth, ID band Patient awake    Reviewed: Allergy & Precautions, NPO status , Patient's Chart, lab work & pertinent test results  Airway Mallampati: I  TM Distance: >3 FB Neck ROM: Full    Dental  (+) Teeth Intact, Dental Advisory Given   Pulmonary    Pulmonary exam normal        Cardiovascular Normal cardiovascular exam     Neuro/Psych    GI/Hepatic   Endo/Other    Renal/GU      Musculoskeletal   Abdominal   Peds  Hematology   Anesthesia Other Findings   Reproductive/Obstetrics                            Anesthesia Physical Anesthesia Plan  ASA: II  Anesthesia Plan: General   Post-op Pain Management:    Induction: Intravenous  Airway Management Planned: Oral ETT  Additional Equipment:   Intra-op Plan:   Post-operative Plan: Extubation in OR  Informed Consent: I have reviewed the patients History and Physical, chart, labs and discussed the procedure including the risks, benefits and alternatives for the proposed anesthesia with the patient or authorized representative who has indicated his/her understanding and acceptance.     Plan Discussed with: CRNA and Surgeon  Anesthesia Plan Comments:         Anesthesia Quick Evaluation  

## 2014-12-16 ENCOUNTER — Encounter (HOSPITAL_COMMUNITY): Payer: Self-pay | Admitting: General Surgery

## 2014-12-16 MED ORDER — OXYCODONE HCL 5 MG PO TABS: 5.0000 mg | ORAL_TABLET | Freq: Four times a day (QID) | ORAL | Status: DC | PRN

## 2014-12-16 NOTE — Discharge Summary (Signed)
  Physician Discharge Summary  Patient ID: [name] MRN: [id number] DOB/AGE: 12-Mar-1981 33 y.o.  Admit date: 12/14/2014 Discharge date: 12/16/2014  Admitting Diagnosis: Acute cholecystitis   Discharge Diagnosis Patient Active Problem List   Diagnosis Date Noted  . Acute calculous cholecystitis 12/14/2014  . SVD (spontaneous vaginal delivery) 10/08/2013  . Pregnancy 10/07/2013  . Supervision of high-risk pregnancy 08/24/2013  . History of stillbirth 08/24/2013    Consultants Acute cholecystitis with empyema of the gallbladder  Imaging: US Abdomen Limited Ruq  12/14/2014   CLINICAL DATA:  Right upper quadrant pain and vomiting for 4 days.  EXAM: US ABDOMEN LIMITED - RIGHT UPPER QUADRANT  COMPARISON:  CT abdomen and pelvis 04/04/2012  FINDINGS: Gallbladder:  Nonmobile gallstone in the gallbladder neck measuring about 2.5 cm diameter. Gallbladder is mildly distended and there is pericholecystic edema. Gallbladder wall thickness is upper limits of normal. Murphy's sign is negative.  Common bile duct:  Diameter: 5 mm, normal  Liver:  Diffusely increased parenchymal echotexture suggesting fatty infiltration. No focal lesions identified.  IMPRESSION: Nonmobile gallstone in the gallbladder neck with mild gallbladder distention and mild pericholecystic edema. Murphy's sign is negative. Findings are nonspecific but suggest cholecystitis.   Electronically Signed   By: Burman Nieves M.D.   On: 12/14/2014 22:19    Procedures Laparoscopic cholecystectomy---Dr. Denton Brick Course:  [name] is a 34 year old Hispanic speaking female who presented to [facility] with RUQ abdominal pain.  Workup showed acute cholecystitis.  Patient was admitted and underwent procedure listed above.  Tolerated procedure well and was transferred to the floor.  Diet was advanced as tolerated.  On POD#1, the patient was voiding well, tolerating diet, ambulating well, pain well controlled, vital signs  stable, incisions c/d/i and felt stable for discharge home.  Patient will follow up in our office in 2 weeks and knows to call with questions or concerns.   Note: we communicated via pacific interpreter   Physical Exam: General:  Alert, NAD, pleasant, comfortable Abd:  Soft, ND, mild tenderness, incisions C/D/I    Medication List    TAKE these medications        ibuprofen 600 MG tablet  Commonly known as:  ADVIL,MOTRIN  Take 1 tablet (600 mg total) by mouth every 8 (eight) hours as needed.     norgestimate-ethinyl estradiol 0.25-35 MG-MCG tablet  Commonly known as:  ORTHO-CYCLEN,SPRINTEC,PREVIFEM  Take 1 tablet by mouth daily.     oxyCODONE 5 MG immediate release tablet  Commonly known as:  Oxy IR/ROXICODONE  Take 1-2 tablets (5-10 mg total) by mouth every 6 (six) hours as needed for moderate pain (start post op once taking PO).             Follow-up Information    Follow up with [facility] On 01/04/2015.   Specialty:  General Surgery   Why:  [facility] the Week [facility], 2:45pm, arrive no later than 2:15pm for paperwork   Contact information:   [address] [place] [address] [phone]       Signed: Ashok Norris, [facility] [phone]  12/16/2014, 8:50 AM

## 2014-12-16 NOTE — Progress Notes (Signed)
Patient discharged to home with instructions, verbalized understanding. 

## 2014-12-16 NOTE — Discharge Instructions (Signed)
[facility] ______[facility] °LAPAROSCOPIC SURGERY: POST OP INSTRUCTIONS °Always review your discharge instruction sheet given to you by the facility where your surgery was performed. °IF YOU HAVE DISABILITY OR FAMILY LEAVE FORMS, YOU MUST BRING THEM TO THE OFFICE FOR PROCESSING.   °DO NOT GIVE THEM TO YOUR DOCTOR. ° °1. A prescription for pain medication may be given to you upon discharge.  Take your pain medication as prescribed, if needed.  If narcotic pain medicine is not needed, then you may take acetaminophen (Tylenol) or ibuprofen (Advil) as needed. °2. Take your usually prescribed medications unless otherwise directed. °3. If you need a refill on your pain medication, please contact your pharmacy.  They will contact our office to request authorization. Prescriptions will not be filled after 5pm or on week-ends. °4. You should follow a light diet the first few days after arrival home, such as soup and crackers, etc.  Be sure to include lots of fluids daily. °5. Most patients will experience some swelling and bruising in the area of the incisions.  Ice packs will help.  Swelling and bruising can take several days to resolve.  °6. It is common to experience some constipation if taking pain medication after surgery.  Increasing fluid intake and taking a stool softener (such as Colace) will usually help or prevent this problem from occurring.  A mild laxative (Milk of Magnesia or Miralax) should be taken according to package instructions if there are no bowel movements after 48 hours. °7. Unless discharge instructions indicate otherwise, you may remove your bandages 24-48 hours after surgery, and you may shower at that time.  You may have steri-strips (small skin tapes) in place directly over the incision.  These strips should be left on the skin for 7-10 days.  If your surgeon used skin glue on the incision, you may shower in 24 hours.  The glue will flake off over the next 2-3 weeks.  Any sutures or  staples will be removed at the office during your follow-up visit. °8. ACTIVITIES:  You may resume regular (light) daily activities beginning the next day--such as daily self-care, walking, climbing stairs--gradually increasing activities as tolerated.  You may have sexual intercourse when it is comfortable.  Refrain from any heavy lifting or straining until approved by your doctor. °a. You may drive when you are no longer taking prescription pain medication, you can comfortably wear a seatbelt, and you can safely maneuver your car and apply brakes. °b. RETURN TO WORK:  __________________________________________________________ °9. You should see your doctor in the office for a follow-up appointment approximately 2-3 weeks after your surgery.  Make sure that you call for this appointment within a day or two after you arrive home to insure a convenient appointment time. °10. OTHER INSTRUCTIONS: __________________________________________________________________________________________________________________________ __________________________________________________________________________________________________________________________ °WHEN TO CALL YOUR DOCTOR: °1. Fever over 101.0 °2. Inability to urinate °3. Continued bleeding from incision. °4. Increased pain, redness, or drainage from the incision. °5. Increasing abdominal pain ° °The clinic staff is available to answer your questions during regular business hours.  Please don’t hesitate to call and ask to speak to one of the nurses for clinical concerns.  If you have a medical emergency, go to the nearest emergency room or call 911.  A surgeon from [facility] is always on call at the hospital. °[address], [address], [place], [place]  [address] ? [address] [address], [place],    [address] °[phone] ? [phone] ? FAX [phone] °Web site:   [email or web address] Colecistectoma laparoscpica, cuidados  posteriores (Laparoscopic Cholecystectomy, Care After) Estas indicaciones le proporcionan informacin general acerca de cmo deber cuidarse despus del procedimiento. El mdico tambin podr darle instrucciones especficas. Comunquese con el mdico si tiene algn problema o tiene preguntas despus del procedimiento.  CUIDADOS EN EL HOGAR Cambie los apsitos (vendajes) tal como le indic el mdico. Mantenga la herida limpia y seca. Lave la herida suavemente con [place]. Seque dando palmaditas con una toalla limpia y seca. No se bae en la baera, no practique natacin ni use el jacuzzy durante 2semanas o segn las indicaciones del mdico. Solo tome los medicamentos que le haya indicado su mdico. Siga la dieta que le haya indicado el mdico. No levante ningn objeto que pese ms de 10libras (4,5kg) hasta que el mdico lo autorice. No practique deportes de contacto durante 1semana o segn las indicaciones del mdico. SOLICITE AYUDA SI: Observa que la herida est roja, hinchada (inflamada) o le duele. Observa una secrecin de color blanco amarillento (pus) en la herida. Observa que un lquido supura por la herida durante ms de 1 da. La herida tiene mal olor. La herida se abre. SOLICITE AYUDA DE INMEDIATO SI:  Tiene una erupcin cutnea. Tiene dificultad para respirar. Siente dolor en el pecho. Tiene fiebre. Siente que Chief Technology Officer en los hombros (en la zona donde van los breteles) Harrington Challenger. Siente mareos o se desmaya. Siente un dolor intenso en el vientre (abdominal). Tiene malestar estomacal (nuseas) o vomita durante ms de 1da. Document Released: 12/06/2010 Document Revised: 01/14/2013 [facility] Patient Information 2015 [place], [place]. This information is not intended to replace advice given to you by your health care provider. Make sure you discuss any questions you have with your health care provider.

## 2016-06-15 ENCOUNTER — Ambulatory Visit: Payer: Self-pay | Attending: Family Medicine | Admitting: Family Medicine

## 2016-06-15 VITALS — BP 98/63 | HR 67 | Temp 98.1°F | Resp 18 | Ht 60.0 in | Wt 143.8 lb

## 2016-06-15 DIAGNOSIS — Z7689 Persons encountering health services in other specified circumstances: Secondary | ICD-10-CM | POA: Insufficient documentation

## 2016-06-15 DIAGNOSIS — R1012 Left upper quadrant pain: Secondary | ICD-10-CM | POA: Insufficient documentation

## 2016-06-15 DIAGNOSIS — Z9049 Acquired absence of other specified parts of digestive tract: Secondary | ICD-10-CM | POA: Insufficient documentation

## 2016-06-15 DIAGNOSIS — Z79899 Other long term (current) drug therapy: Secondary | ICD-10-CM | POA: Insufficient documentation

## 2016-06-15 LAB — POCT URINE PREGNANCY: Preg Test, Ur: NEGATIVE

## 2016-06-15 LAB — COMPLETE METABOLIC PANEL WITH GFR
ALBUMIN: 4.3 g/dL (ref 3.6–5.1)
ALK PHOS: 94 U/L (ref 33–115)
ALT: 16 U/L (ref 6–29)
AST: 17 U/L (ref 10–30)
BUN: 10 mg/dL (ref 7–25)
CALCIUM: 9.3 mg/dL (ref 8.6–10.2)
CHLORIDE: 104 mmol/L (ref 98–110)
CO2: 24 mmol/L (ref 20–31)
Creat: 0.86 mg/dL (ref 0.50–1.10)
GFR, EST NON AFRICAN AMERICAN: 87 mL/min (ref >=60)
GFR, Est African American: >89 mL/min (ref >=60)
GLUCOSE: 76 mg/dL (ref 65–99)
Potassium: 4 mmol/L (ref 3.5–5.3)
SODIUM: 138 mmol/L (ref 135–146)
Total Bilirubin: 0.3 mg/dL (ref 0.2–1.2)
Total Protein: 7.7 g/dL (ref 6.1–8.1)

## 2016-06-15 LAB — LIPID PANEL
CHOL/HDL RATIO: 4.1 ratio (ref <5.0)
CHOLESTEROL: 144 mg/dL (ref <200)
HDL: 35 mg/dL — AB (ref >50)
LDL Cholesterol: 92 mg/dL (ref <100)
Triglycerides: 85 mg/dL (ref <150)
VLDL: 17 mg/dL (ref <30)

## 2016-06-15 LAB — CBC WITH DIFFERENTIAL/PLATELET
BASOS ABS: 0 {cells}/uL (ref 0–200)
Basophils Relative: 0 %
EOS PCT: 2 %
Eosinophils Absolute: 168 cells/uL (ref 15–500)
HCT: 39.2 % (ref 35.0–45.0)
HEMOGLOBIN: 13.2 g/dL (ref 11.7–15.5)
LYMPHS ABS: 2352 {cells}/uL (ref 850–3900)
Lymphocytes Relative: 28 %
MCH: 30.1 pg (ref 27.0–33.0)
MCHC: 33.7 g/dL (ref 32.0–36.0)
MCV: 89.3 fL (ref 80.0–100.0)
MPV: 11.1 fL (ref 7.5–12.5)
Monocytes Absolute: 504 cells/uL (ref 200–950)
Monocytes Relative: 6 %
Neutro Abs: 5376 cells/uL (ref 1500–7800)
Neutrophils Relative %: 64 %
Platelets: 284 10*3/uL (ref 140–400)
RBC: 4.39 MIL/uL (ref 3.80–5.10)
RDW: 13.7 % (ref 11.0–15.0)
WBC: 8.4 10*3/uL (ref 3.8–10.8)

## 2016-06-15 MED ORDER — ACETAMINOPHEN 500 MG PO TABS
1000.0000 mg | ORAL_TABLET | Freq: Four times a day (QID) | ORAL | 0 refills | Status: DC | PRN
Start: 2016-06-15 — End: 2016-06-21

## 2016-06-15 MED ORDER — IBUPROFEN 600 MG PO TABS: 600.0000 mg | ORAL_TABLET | Freq: Three times a day (TID) | ORAL | 0 refills | Status: DC | PRN

## 2016-06-15 NOTE — Progress Notes (Signed)
Subjective:  Patient ID: [name], female    DOB: 04/24/1980  Age: 36 y.o. MRN: [id number]  CC: Establish Care   [facility]   Abdominal pain: 2 weeks LUQ pain. Reports pain feel like "pins and needles" sensation.Denies any nausea, vomiting, or jaundice. Reports bending over makes pain worse. Denies drinking alcohol. Reports PMH of cholescetomy and appendectomy. Denies taking anything for symptoms.    Outpatient Medications Prior to Visit  Medication Sig Dispense Refill  . ibuprofen (ADVIL,MOTRIN) 600 MG tablet Take 1 tablet (600 mg total) by mouth every 8 (eight) hours as needed. (Patient not taking: Reported on 12/14/2014) 60 tablet 1  . norgestimate-ethinyl estradiol (ORTHO-CYCLEN,SPRINTEC,PREVIFEM) 0.25-35 MG-MCG tablet Take 1 tablet by mouth daily. (Patient not taking: Reported on 03/24/2014) 1 Package 11  . oxyCODONE (OXY IR/ROXICODONE) 5 MG immediate release tablet Take 1-2 tablets (5-10 mg total) by mouth every 6 (six) hours as needed for moderate pain (start post op once taking PO). (Patient not taking: Reported on 06/15/2016) 40 tablet 0   No facility-administered medications prior to visit.     ROS Review of Systems  Constitutional: Negative.   HENT: Negative.   Eyes: Negative.   Respiratory: Negative.   Cardiovascular: Negative.   Gastrointestinal: Positive for abdominal pain.  Skin: Negative.    Objective:  BP 98/63 (BP Location: Left Arm, Patient Position: Sitting, Cuff Size: Normal)   Pulse 67   Temp 98.1 F (36.7 C) (Oral)   Resp 18   Ht 5' (1.524 m)   Wt 143 lb 12.8 oz (65.2 kg)   LMP 06/14/2016   SpO2 97%   BMI 28.08 kg/m   BP/Weight 06/15/2016 12/16/2014 12/15/2014  Systolic BP 98 92 -  Diastolic BP 63 56 -  Wt. (Lbs) 143.8 - 138.01  BMI 28.08 - 24.45   Physical Exam  Eyes: Conjunctivae are normal. Pupils are equal, round, and reactive to light.  Cardiovascular: Normal rate, regular rhythm, normal heart sounds and  intact distal pulses.   Pulmonary/Chest: Effort normal and breath sounds normal.  Abdominal: Soft. Bowel sounds are normal. She exhibits no mass. There is tenderness (LUQ). There is no rebound and no guarding.  Skin: Skin is warm and dry.  Nursing note and vitals reviewed.  Assessment & Plan:   Problem List Items Addressed This Visit    None    Visit Diagnoses    Acute LUQ pain    -  Primary   Relevant Orders   CBC with Differential (Completed)   COMPLETE METABOLIC PANEL WITH GFR (Completed)   Lipid Panel (Completed)   Lipase (Completed)   POCT urine pregnancy (Completed)   US Abdomen Complete (Completed)   History of cholecystectomy       Relevant Orders   CBC with Differential (Completed)   COMPLETE METABOLIC PANEL WITH GFR (Completed)   Lipid Panel (Completed)   Lipase (Completed)   US Abdomen Complete (Completed)      Meds ordered this encounter  Medications  . DISCONTD: ibuprofen (ADVIL,MOTRIN) 600 MG tablet    Sig: Take 1 tablet (600 mg total) by mouth every 8 (eight) hours as needed for moderate pain or cramping (Take with food.).    Dispense:  20 tablet    Refill:  0    Order Specific Question:   Supervising Provider    Answer:   Quentin AngstJEGEDE, OLUGBEMIGA E [id number]  . DISCONTD: acetaminophen (TYLENOL) 500 MG tablet    Sig: Take 2 tablets (1,000 mg  total) by mouth every 6 (six) hours as needed for mild pain or moderate pain.    Dispense:  30 tablet    Refill:  0    Order Specific Question:   Supervising Provider    Answer:   Quentin Angst [id number]    Follow-up: Return if symptoms worsen or fail to improve.   Jaime Bark FNP

## 2016-06-15 NOTE — Patient Instructions (Signed)
Dolor abdominal en adultos °Abdominal Pain, Adult °El dolor abdominal puede tener muchas causas. A menudo, no es grave y mejora sin tratamiento o con tratamiento en la casa. Sin embargo, a veces el dolor abdominal es intenso. El médico revisará sus antecedentes médicos y le hará un examen físico para tratar de determinar la causa del dolor abdominal. °Siga estas instrucciones en su casa: °· Tome los medicamentos de venta libre y los recetados solamente como se lo haya indicado el médico. No tome un laxante a menos que se lo haya indicado el médico. °· Beba suficiente líquido para mantener la orina clara o de color amarillo pálido. °· Controle su afección para ver si hay cambios. °· Concurra a todas las visitas de control como se lo haya indicado el médico. Esto es importante. °Comuníquese con un médico si: °· El dolor abdominal cambia o empeora. °· No tiene apetito o baja de peso sin proponérselo. °· Está estreñido o tiene diarrea durante más de 2 o 3 días. °· Tiene dolor cuando orina o defeca. °· El dolor abdominal lo despierta de noche. °· El dolor empeora con las comidas, después de comer o con determinados alimentos. °· Tiene vómitos y no puede retener nada. °· Tiene fiebre. °Solicite ayuda de inmediato si: °· El dolor no desaparece tan pronto como el médico le dijo que era esperable. °· No puede detener los vómitos. °· El dolor se siente solo en zonas del abdomen, como el lado derecho o la parte inferior izquierda del abdomen. °· Las heces son sanguinolentas o de color negro, o de aspecto alquitranado. °· Tiene dolor intenso, cólicos, o meteorismo en el abdomen. °· Tiene signos de deshidratación, por ejemplo: °? Orina oscura, muy escasa o falta de orina. °? Labios agrietados. °? Boca seca. °? Ojos hundidos. °? Somnolencia. °? Debilidad. °Esta información no tiene como fin reemplazar el consejo del médico. Asegúrese de hacerle al médico cualquier pregunta que tenga. °Document Released: 03/26/2005 Document  Revised: 03/15/2016 Document Reviewed: 09/07/2015 °Elsevier Interactive Patient Education © 2017 Elsevier Inc. ° °

## 2016-06-15 NOTE — Progress Notes (Signed)
Patient is here for lower left stomach pain   Patients stated that it felt like a bump inside & its hard it bothers her when sitting down  Patient is not on any current meds   Patient has not eaten today

## 2016-06-16 LAB — LIPASE: Lipase: 16 U/L (ref 7–60)

## 2016-06-20 ENCOUNTER — Ambulatory Visit (HOSPITAL_COMMUNITY)
Admission: RE | Admit: 2016-06-20 | Discharge: 2016-06-20 | Disposition: A | Discharge status: Home / Self Care | Payer: Self-pay | Source: Ambulatory Visit | Attending: Family Medicine | Admitting: Family Medicine

## 2016-06-20 ENCOUNTER — Telehealth: Payer: Self-pay

## 2016-06-20 DIAGNOSIS — Z9049 Acquired absence of other specified parts of digestive tract: Secondary | ICD-10-CM | POA: Insufficient documentation

## 2016-06-20 DIAGNOSIS — R1012 Left upper quadrant pain: Secondary | ICD-10-CM | POA: Insufficient documentation

## 2016-06-20 NOTE — Telephone Encounter (Signed)
-----   Message from Lizbeth BarkMandesia R Hairston, FNP sent at 06/20/2016  8:08 AM EDT ----- Lipase lab is normal. Lipase level can be elevated with certain conditions like pancreatitis. Kidney function normal Labs that evaluate your blood cells, fluids, and electrolytes are normal. Start eating a diet low in saturated fat. Limit your intake of fried foods, red meats, and whole milk. Increase your physical activity.

## 2016-06-20 NOTE — Telephone Encounter (Signed)
Patient return CMA call  Patient verify DOB  Patient was aware and understood her results

## 2016-06-20 NOTE — Telephone Encounter (Signed)
CMA call patient to go over labs  Results  Patient did not answer but CMA left a VM stating the reason of the call & to call us back

## 2016-06-21 ENCOUNTER — Other Ambulatory Visit: Payer: Self-pay | Admitting: Family Medicine

## 2016-06-21 DIAGNOSIS — R1011 Right upper quadrant pain: Secondary | ICD-10-CM

## 2016-06-21 DIAGNOSIS — Z9049 Acquired absence of other specified parts of digestive tract: Secondary | ICD-10-CM

## 2016-06-21 DIAGNOSIS — K760 Fatty (change of) liver, not elsewhere classified: Secondary | ICD-10-CM

## 2016-06-22 ENCOUNTER — Telehealth: Payer: Self-pay

## 2016-06-22 NOTE — Telephone Encounter (Signed)
-----   Message from Lizbeth BarkMandesia R Hairston, FNP sent at 06/21/2016 12:56 PM EDT ----- Abdominal Ultrasound showed no abnormalities of the kidney, spleen, pancreas, or aorta. No masses.  It did show findings consistent with fatty liver. Avoid alcohol and Tylenol. Begin eating a diet lower in fat. Decrease your intake of fried foods and whole milk.  You will be referred to gastroenterology.

## 2016-06-22 NOTE — Telephone Encounter (Signed)
CMA call to go over abdomen ultrasound results  Patient did not answer but cma left a vM stating the reason of the call & to call back

## 2016-06-25 ENCOUNTER — Telehealth: Payer: Self-pay | Admitting: Family Medicine

## 2016-06-25 NOTE — Telephone Encounter (Signed)
CMA return patient call  Patient was aware and understood her results

## 2016-06-25 NOTE — Telephone Encounter (Signed)
Patient called the office in regards to a miss call from nurse for results. Pt gave the verbal ok to leave detailed message if she doesn't answer.   Thank you.

## 2016-07-13 ENCOUNTER — Ambulatory Visit: Payer: Self-pay | Attending: Family Medicine

## 2018-07-02 ENCOUNTER — Other Ambulatory Visit: Payer: Self-pay

## 2018-07-02 ENCOUNTER — Encounter (HOSPITAL_COMMUNITY): Payer: Self-pay | Admitting: Emergency Medicine

## 2018-07-02 ENCOUNTER — Ambulatory Visit (HOSPITAL_COMMUNITY)
Admission: EM | Admit: 2018-07-02 | Discharge: 2018-07-02 | Disposition: A | Discharge status: Home / Self Care | Payer: Self-pay | Attending: Family Medicine | Admitting: Family Medicine

## 2018-07-02 DIAGNOSIS — M7062 Trochanteric bursitis, left hip: Secondary | ICD-10-CM

## 2018-07-02 MED ORDER — TRIAMCINOLONE ACETONIDE 40 MG/ML IJ SUSP
INTRAMUSCULAR | Status: AC
  Filled 2018-07-02: qty 1

## 2018-07-02 MED ORDER — LIDOCAINE HCL 2 % IJ SOLN
INTRAMUSCULAR | Status: AC
  Filled 2018-07-02: qty 20

## 2018-07-02 MED ORDER — METHYLPREDNISOLONE ACETATE 40 MG/ML IJ SUSP
INTRAMUSCULAR | Status: AC
  Filled 2018-07-02: qty 1

## 2018-07-02 MED ORDER — LIDOCAINE HCL 2 % IJ SOLN: 5.0000 mL | Freq: Once | INTRAMUSCULAR | Status: DC

## 2018-07-02 MED ORDER — PREDNISONE 50 MG PO TABS: 50.0000 mg | ORAL_TABLET | Freq: Every day | ORAL | 0 refills | Status: AC

## 2018-07-02 MED ORDER — TRIAMCINOLONE ACETONIDE 40 MG/ML IJ SUSP: 40.0000 mg | Freq: Once | INTRAMUSCULAR | Status: DC

## 2018-07-02 NOTE — Discharge Instructions (Addendum)
Da Neomia Dear inyeccion de una steroid El dolor puede ser mas peor antes de mejoran en los proximos 24 horas  Lee la informacion sobre el causa de su dolor Hacer los ejercicos 2-3 veces cada dia   Regrese si sus sintomas no mejoran o son Public librarian, tiene [place], su piel es mas rojo

## 2018-07-02 NOTE — ED Provider Notes (Signed)
[facility]    CSN: [id number] Arrival date & time: 07/02/18  1044     History   Chief Complaint Chief Complaint  Patient presents with  . Hip Pain    HPI [name] is a 38 y.o. female no significant past medical history presenting today for evaluation of left hip pain.  Patient states that beginning Sunday and for the past 4 days she has had discomfort in her left hip.  She has increased pain with weightbearing as well as walking.  She denies any overlying skin changes of rashes or bruising.  Denies any injury, increase in activity or falls.  She is tried taking Aleve without relief.  Denies history of similar pain in this area.  Denies any back pain.  Denies any changes in urination or bowel movements.  Denies numbness or tingling.  Denies fevers.  HPI  Past Medical History:  Diagnosis Date  . Medical history non-contributory     Patient Active Problem List   Diagnosis Date Noted  . Acute calculous cholecystitis 12/14/2014  . SVD (spontaneous vaginal delivery) 10/08/2013  . Pregnancy 10/07/2013  . Supervision of high-risk pregnancy 08/24/2013  . History of stillbirth 08/24/2013    Past Surgical History:  Procedure Laterality Date  . APPENDECTOMY    . CHOLECYSTECTOMY  12/15/2014   Procedure: LAPAROSCOPIC CHOLECYSTECTOMY, attempted cholangiogram;  Surgeon: Violeta Gelinas, MD;  Location: [facility] OR;  Service: General;;  . LAPAROSCOPIC APPENDECTOMY  04/05/2012   Procedure: APPENDECTOMY LAPAROSCOPIC;  Surgeon: Lodema Pilot, DO;  Location: [facility];  Service: General;  Laterality: N/A;    OB History    Gravida  5   Para  5   Term  3   Preterm  2   AB  0   Living  4     SAB  0   TAB  0   Ectopic  0   Multiple  0   Live Births  4            Home Medications    Prior to Admission medications   Medication Sig Start Date End Date Taking? Authorizing Provider  Naproxen Sodium (ALEVE PO) Take by mouth.   Yes [provider]   predniSONE (DELTASONE) 50 MG tablet Take 1 tablet (50 mg total) by mouth daily for 5 days. With food 07/02/18 07/07/18  Edson Deridder, Junius Creamer, PA-C    Family History History reviewed. No pertinent family history.  Social History Social History   Tobacco Use  . Smoking status: Never Smoker  . Smokeless tobacco: Never Used  Substance Use Topics  . Alcohol use: No  . Drug use: No     Allergies   Patient has no known allergies.   Review of Systems Review of Systems  Constitutional: Negative for fatigue and fever.  Eyes: Negative for visual disturbance.  Respiratory: Negative for shortness of breath.   Cardiovascular: Negative for chest pain.  Gastrointestinal: Negative for abdominal pain, nausea and vomiting.  Musculoskeletal: Positive for arthralgias and myalgias. Negative for joint swelling.  Skin: Negative for color change, rash and wound.  Neurological: Negative for dizziness, weakness, light-headedness and headaches.     Physical Exam Triage Vital Signs ED Triage Vitals  Enc Vitals Group     BP 07/02/18 1107 (!) 104/56     Pulse Rate 07/02/18 1107 100     Resp 07/02/18 1107 18     Temp 07/02/18 1107 98 F (36.7 C)     Temp Source 07/02/18 1107  Oral     SpO2 07/02/18 1107 100 %     Weight --      Height --      Head Circumference --      Peak Flow --      Pain Score 07/02/18 1102 9     Pain Loc --      Pain Edu? --      Excl. in GC? --    No data found.  Updated Vital Signs BP (!) 104/56 (BP Location: Left Arm)   Pulse 100   Temp 98 F (36.7 C) (Oral)   Resp 18   LMP 07/01/2018   SpO2 100%   Visual Acuity Right Eye Distance:   Left Eye Distance:   Bilateral Distance:    Right Eye Near:   Left Eye Near:    Bilateral Near:     Physical Exam Vitals signs and nursing note reviewed.  Constitutional:      General: She is not in acute distress.    Appearance: She is well-developed.  HENT:     Head: Normocephalic and atraumatic.  Eyes:      Conjunctiva/sclera: Conjunctivae normal.  Neck:     Musculoskeletal: Neck supple.  Cardiovascular:     Rate and Rhythm: Normal rate and regular rhythm.     Heart sounds: No murmur.  Pulmonary:     Effort: Pulmonary effort is normal. No respiratory distress.     Breath sounds: Normal breath sounds.  Abdominal:     Palpations: Abdomen is soft.     Tenderness: There is no abdominal tenderness.  Musculoskeletal:     Comments: Nontender to palpation of thoracic, lumbar spine midline, nontender to palpation of bilateral lumbar regions as well as lower left sacral area, tenderness over lateral proximal thigh extending into hip, most tender over greater trochanteric area  Full active range of motion of left hip although hip flexion does elicit pain  Patellar reflex 2+ bilaterally  Ambulating slowly and limiting pressure on left side  Skin:    General: Skin is warm and dry.  Neurological:     Mental Status: She is alert.      UC Treatments / Results  Labs (all labs ordered are listed, but only abnormal results are displayed) Labs Reviewed - No data to display  EKG None  Radiology No results found.  Procedures Procedures (including critical care time) Bursa Injection- Supervised by Dr. Tracie Harrier Area cleaned with alcohol, area of most tenderness identified, 25 g needle injected perpendicular to area, introduced and hit bone, slightly removed and  Injected 1 mL/40 mg Kenalod with approximately 2 mL lidocaine 2%.   Medications Ordered in UC Medications  triamcinolone acetonide (KENALOG-40) injection 40 mg (has no administration in time range)  lidocaine (XYLOCAINE) 2 % (with pres) injection 100 mg (has no administration in time range)    Initial Impression / Assessment and Plan / UC Course  I have reviewed the triage vital signs and the nursing notes.  Pertinent labs & imaging results that were available during my care of the patient were reviewed by me and considered in my  medical decision making (see chart for details).     Likely trochanteric bursitis, steroid injection provided.  Will recommend may continue NSAIDs, exercises provided.  Continue to monitor,Discussed strict return precautions. Patient verbalized understanding and is agreeable with plan.  Final Clinical Impressions(s) / UC Diagnoses   Final diagnoses:  Greater trochanteric bursitis of left hip     Discharge Instructions  Da Neomia Dear inyeccion de una steroid El dolor puede ser mas peor antes de mejoran en los proximos 24 horas  Lee la informacion sobre el causa de su dolor Hacer los ejercicos 2-3 veces cada dia   Regrese si sus sintomas no mejoran o son Public librarian, tiene [employer], su piel es mas rojo   ED Prescriptions    Medication Sig Dispense Auth. Provider   predniSONE (DELTASONE) 50 MG tablet Take 1 tablet (50 mg total) by mouth daily for 5 days. With food 5 tablet Sabre Leonetti, Hubbard C, PA-C     Controlled Substance Prescriptions [place] Controlled Substance Registry consulted? Not Applicable   Lew Dawes, [place] 07/02/18 1312

## 2018-07-02 NOTE — ED Triage Notes (Signed)
Left hip pain.  Since Sunday has had this pain.  No fall

## 2019-05-28 ENCOUNTER — Encounter (HOSPITAL_COMMUNITY): Payer: Self-pay | Admitting: Obstetrics and Gynecology

## 2019-05-28 ENCOUNTER — Inpatient Hospital Stay (HOSPITAL_COMMUNITY)
Admission: EM | Admit: 2019-05-28 | Discharge: 2019-05-29 | Disposition: A | Discharge status: Home / Self Care | Payer: Self-pay | Attending: Obstetrics and Gynecology | Admitting: Obstetrics and Gynecology

## 2019-05-28 ENCOUNTER — Other Ambulatory Visit: Payer: Self-pay

## 2019-05-28 DIAGNOSIS — R102 Pelvic and perineal pain: Secondary | ICD-10-CM | POA: Insufficient documentation

## 2019-05-28 DIAGNOSIS — O3680X0 Pregnancy with inconclusive fetal viability, not applicable or unspecified: Secondary | ICD-10-CM

## 2019-05-28 DIAGNOSIS — O26891 Other specified pregnancy related conditions, first trimester: Secondary | ICD-10-CM | POA: Insufficient documentation

## 2019-05-28 DIAGNOSIS — O26899 Other specified pregnancy related conditions, unspecified trimester: Secondary | ICD-10-CM

## 2019-05-28 DIAGNOSIS — O09521 Supervision of elderly multigravida, first trimester: Secondary | ICD-10-CM | POA: Insufficient documentation

## 2019-05-28 DIAGNOSIS — Z3A01 Less than 8 weeks gestation of pregnancy: Secondary | ICD-10-CM | POA: Insufficient documentation

## 2019-05-28 DIAGNOSIS — O209 Hemorrhage in early pregnancy, unspecified: Secondary | ICD-10-CM | POA: Insufficient documentation

## 2019-05-28 LAB — URINALYSIS, ROUTINE W REFLEX MICROSCOPIC
Bacteria, UA: NONE SEEN
Bilirubin Urine: NEGATIVE
Glucose, UA: NEGATIVE mg/dL
Ketones, ur: NEGATIVE mg/dL
Leukocytes,Ua: NEGATIVE
Nitrite: NEGATIVE
Protein, ur: 30 mg/dL — AB
RBC / HPF: >50 RBC/hpf — ABNORMAL HIGH (ref 0–5)
Specific Gravity, Urine: 1.012 (ref 1.005–1.030)
pH: 6 (ref 5.0–8.0)

## 2019-05-28 LAB — I-STAT BETA HCG BLOOD, ED (MC, WL, AP ONLY): I-stat hCG, quantitative: >2000 m[IU]/mL — ABNORMAL HIGH (ref <5)

## 2019-05-28 NOTE — ED Provider Notes (Signed)
MSE was initiated and I personally evaluated the patient and placed orders (if any) at  11:17 PM on May 28, 2019.  Patient to ED with lower abdominal cramping, vaginal bleeding and reports passing a large "ball of mucus", prompting ED evaluation. She though she may be getting her period until she saw the other type of discharge. LMP 05/03/19, normal time, normal flow. No fever, nausea, vomiting, urinary complaints, lightheadedness or syncope.   She is overall well appearing.  VSS Abdomen mildly tender across lower quadrants, greater in the suprapubic abdomen.  Remainder exam deferred to MAU provider.   The patient will be transported to MAU for further care.   The patient appears stable so that the remainder of the MSE may be completed by another provider.   Elpidio Anis, PA-C 05/28/19 2320    Terald Sleeper, MD 05/29/19 [phone]

## 2019-05-28 NOTE — MAU Provider Note (Signed)
Chief Complaint: Abdominal Pain   First Provider Initiated Contact with Patient 05/28/19 2353     Interpretor used throughout visit    SUBJECTIVE HPI: [name] is a 39 y.o. [id number] at [redacted]w[redacted]d by LMP who presents to maternity admissions reporting cramping and bleeding.  Did not know she was pregnant. Thought she had a normal period 2 weeks ago.  Passed tissue tonight, then cramping greatly improved. . She denies vaginal bleeding, vaginal itching/burning, urinary symptoms, h/a, dizziness, n/v, or fever/chills.    Abdominal Pain This is a new problem. The current episode started today. The onset quality is gradual. The problem occurs intermittently. The problem has been gradually improving. The pain is located in the LLQ, RLQ and suprapubic region. The pain is moderate. The quality of the pain is cramping. The abdominal pain does not radiate. Pertinent negatives include no constipation, diarrhea, dysuria, fever, frequency, headaches, myalgias, nausea or vomiting. Nothing aggravates the pain. The pain is relieved by nothing. She has tried nothing for the symptoms.   RN Note: Pt reports to MAU with complaints of pain in uterus. Pt went to the bathroom and states seeing tissue ball of flesh. Pt didn't know she was pregnant. Pt states that she is bleeding a small amount. Pt states when she passed the tissue, she was bleeding a lot and having some abdominal cramping but the bleeding has slowed down  Past Medical History:  Diagnosis Date  . Medical history non-contributory    Past Surgical History:  Procedure Laterality Date  . APPENDECTOMY    . CHOLECYSTECTOMY  12/15/2014   Procedure: LAPAROSCOPIC CHOLECYSTECTOMY, attempted cholangiogram;  Surgeon: Georganna Skeans, MD;  Location: [facility];  Service: General;;  . LAPAROSCOPIC APPENDECTOMY  04/05/2012   Procedure: APPENDECTOMY LAPAROSCOPIC;  Surgeon: Madilyn Hook, DO;  Location: [facility];  Service: General;  Laterality: N/A;   Social History    Socioeconomic History  . Marital status: Single    Spouse name: Not on file  . Number of children: Not on file  . Years of education: Not on file  . Highest education level: Not on file  Occupational History  . Not on file  Tobacco Use  . Smoking status: Never Smoker  . Smokeless tobacco: Never Used  Substance and Sexual Activity  . Alcohol use: No  . Drug use: No  . Sexual activity: Not Currently    Birth control/protection: None  Other Topics Concern  . Not on file  Social History Narrative  . Not on file   Social Determinants of Health   Financial Resource Strain:   . Difficulty of Paying Living Expenses: Not on file  Food Insecurity:   . Worried About Charity fundraiser in the Last Year: Not on file  . Ran Out of Food in the Last Year: Not on file  Transportation Needs:   . Lack of Transportation (Medical): Not on file  . Lack of Transportation (Non-Medical): Not on file  Physical Activity:   . Days of Exercise per Week: Not on file  . Minutes of Exercise per Session: Not on file  Stress:   . Feeling of Stress : Not on file  Social Connections:   . Frequency of Communication with Friends and Family: Not on file  . Frequency of Social Gatherings with Friends and Family: Not on file  . Attends Religious Services: Not on file  . Active Member of Clubs or Organizations: Not on file  . Attends Archivist Meetings: Not on file  .  Marital Status: Not on file  Intimate Partner Violence:   . Fear of Current or Ex-Partner: Not on file  . Emotionally Abused: Not on file  . Physically Abused: Not on file  . Sexually Abused: Not on file   No current facility-administered medications on file prior to encounter.   Current Outpatient Medications on File Prior to Encounter  Medication Sig Dispense Refill  . Naproxen Sodium (ALEVE PO) Take by mouth.     No Known Allergies  I have reviewed patient's Past Medical Hx, Surgical Hx, Family Hx, Social Hx,  medications and allergies.   ROS:  Review of Systems  Constitutional: Negative for fever.  Gastrointestinal: Positive for abdominal pain. Negative for constipation, diarrhea, nausea and vomiting.  Genitourinary: Negative for dysuria and frequency.  Musculoskeletal: Negative for myalgias.  Neurological: Negative for headaches.   Review of Systems  Other systems negative   Physical Exam  Physical Exam Patient Vitals for the past 24 hrs:  BP Temp Temp src Pulse Resp SpO2  05/28/19 2315 -- -- -- 82 16 100 %  05/28/19 2217 134/89 97.8 F (36.6 C) Oral 100 -- --   Constitutional: Well-developed, well-nourished female in no acute distress.  Cardiovascular: normal rate Respiratory: normal effort GI: Abd soft, non-tender. Pos BS x 4 MS: Extremities nontender, no edema, normal ROM Neurologic: Alert and oriented x 4.  GU: Neg CVAT.  PELVIC EXAM: Cervix pink, visually closed, without lesion, scant red discharge, vaginal walls and external genitalia normal uterus slightly tender, nonenlarged, adnexa without tenderness, enlargement, or mass  Tissue with white cylindrical tissue and blood clot sent to pathology, appears to be early miscarriage vs decidual cast.    LAB RESULTS Results for orders placed or performed during the hospital encounter of 05/28/19 (from the past 24 hour(s))  Urinalysis, Routine w reflex microscopic     Status: Abnormal   Collection Time: 05/28/19 10:26 PM  Result Value Ref Range   Color, Urine YELLOW YELLOW   APPearance CLEAR CLEAR   Specific Gravity, Urine 1.012 1.005 - 1.030   pH 6.0 5.0 - 8.0   Glucose, UA NEGATIVE NEGATIVE mg/dL   Hgb urine dipstick LARGE (A) NEGATIVE   Bilirubin Urine NEGATIVE NEGATIVE   Ketones, ur NEGATIVE NEGATIVE mg/dL   Protein, ur 30 (A) NEGATIVE mg/dL   Nitrite NEGATIVE NEGATIVE   Leukocytes,Ua NEGATIVE NEGATIVE   RBC / HPF >50 (H) 0 - 5 RBC/hpf   WBC, UA 0-5 0 - 5 WBC/hpf   Bacteria, UA NONE SEEN NONE SEEN   Squamous  Epithelial / LPF 0-5 0 - 5   Mucus PRESENT   I-Stat beta hCG blood, ED     Status: Abnormal   Collection Time: 05/28/19 10:50 PM  Result Value Ref Range   I-stat hCG, quantitative >2,000.0 (H) <5 mIU/mL   Comment 3          Wet prep, genital     Status: Abnormal   Collection Time: 05/28/19 11:51 PM   Specimen: Vaginal  Result Value Ref Range   Yeast Wet Prep HPF POC NONE SEEN NONE SEEN   Trich, Wet Prep NONE SEEN NONE SEEN   Clue Cells Wet Prep HPF POC PRESENT (A) NONE SEEN   WBC, Wet Prep HPF POC MODERATE (A) NONE SEEN   Sperm NONE SEEN   hCG, quantitative, pregnancy     Status: Abnormal   Collection Time: 05/29/19 12:14 AM  Result Value Ref Range   hCG, Beta Chain, Quant, S 9,949 (H) <  5 mIU/mL  CBC     Status: Abnormal   Collection Time: 05/29/19 12:14 AM  Result Value Ref Range   WBC 11.8 (H) 4.0 - 10.5 K/uL   RBC 4.34 3.87 - 5.11 MIL/uL   Hemoglobin 12.7 12.0 - 15.0 g/dL   HCT 18.8 41.6 - 60.6 %   MCV 86.9 80.0 - 100.0 fL   MCH 29.3 26.0 - 34.0 pg   MCHC 33.7 30.0 - 36.0 g/dL   RDW 30.1 60.1 - 09.3 %   Platelets 189 150 - 400 K/uL   nRBC 0.0 0.0 - 0.2 %  HIV Antibody (routine testing w rflx)     Status: None   Collection Time: 05/29/19 12:14 AM  Result Value Ref Range   HIV Screen 4th Generation wRfx NON REACTIVE NON REACTIVE    IMAGING US OB Comp Less 14 Wks  Result Date: 05/29/2019 CLINICAL DATA:  Abdominal pain EXAM: OBSTETRIC <14 WK [place] AND TRANSVAGINAL OB US TECHNIQUE: Both transabdominal and transvaginal ultrasound examinations were performed for complete evaluation of the gestation as well as the maternal uterus, adnexal regions, and pelvic cul-de-sac. Transvaginal technique was performed to assess early pregnancy. COMPARISON:  None. FINDINGS: Intrauterine gestational sac: None Yolk sac:  Not Visualized. Embryo:  Not Visualized. Maternal uterus/adnexae: The ovaries are unremarkable. There is no significant free fluid in the patient's pelvis. The endometrium appears  mildly thickened measuring up to approximately 1.1 cm. IMPRESSION: No IUP is visualized. By definition, in the setting of a positive pregnancy test, this reflects a pregnancy of unknown location. Differential considerations include early normal IUP, abnormal IUP/missed abortion, or nonvisualized ectopic pregnancy. Serial beta HCG is suggested. Consider repeat pelvic ultrasound in 14 days. Electronically Signed   By: Katherine Mantle M.D.   On: 05/29/2019 00:47   US OB Transvaginal  Result Date: 05/29/2019 CLINICAL DATA:  Abdominal pain EXAM: OBSTETRIC <14 WK [place] AND TRANSVAGINAL OB US TECHNIQUE: Both transabdominal and transvaginal ultrasound examinations were performed for complete evaluation of the gestation as well as the maternal uterus, adnexal regions, and pelvic cul-de-sac. Transvaginal technique was performed to assess early pregnancy. COMPARISON:  None. FINDINGS: Intrauterine gestational sac: None Yolk sac:  Not Visualized. Embryo:  Not Visualized. Maternal uterus/adnexae: The ovaries are unremarkable. There is no significant free fluid in the patient's pelvis. The endometrium appears mildly thickened measuring up to approximately 1.1 cm. IMPRESSION: No IUP is visualized. By definition, in the setting of a positive pregnancy test, this reflects a pregnancy of unknown location. Differential considerations include early normal IUP, abnormal IUP/missed abortion, or nonvisualized ectopic pregnancy. Serial beta HCG is suggested. Consider repeat pelvic ultrasound in 14 days. Electronically Signed   By: Katherine Mantle M.D.   On: 05/29/2019 00:47     MAU Management/MDM: Ordered usual first trimester r/o ectopic labs.   Pelvic exam and cultures done Will check baseline Ultrasound to rule out ectopic.  This bleeding/pain can represent a normal pregnancy with bleeding, spontaneous abortion or even an ectopic which can be life-threatening.  The process as listed above helps to determine which of these  is present.  This appears to have been a spontaneous abortion We cannot exclude, however that it might be an early pregnancy or ectopic Recommend repeat HCG in 2 days.  ASSESSMENT 1. Pregnancy of unknown anatomic location   2. Vaginal bleeding in pregnancy, first trimester   3. Pelvic pain affecting pregnancy   4. Pregnancy of unknown anatomic location   5. Vaginal bleeding in pregnancy, first  trimester   6. Pelvic pain affecting pregnancy     PLAN Discharge home Plan to repeat HCG level in 48 hours in MAU   Will repeat  Ultrasound in about 7-10 days if HCG levels double appropriately  Ectopic precautions Bleeding precautions  Pt stable at time of discharge. Encouraged to return here or to other Urgent Care/ED if she develops worsening of symptoms, increase in pain, fever, or other concerning symptoms.    Wynelle Bourgeois CNM, MSN Certified Nurse-Midwife 05/28/2019  11:53 PM

## 2019-05-28 NOTE — ED Triage Notes (Signed)
Pt c/o abdominal pain. Went to bathroom today and had brown discharge from vagina. LMP Jan 25

## 2019-05-28 NOTE — MAU Note (Signed)
Pt reports to MAU with complaints of pain in uterus. Pt went to the bathroom and states seeing tissue ball of flesh. Pt didn't know she was pregnant. Pt states that she is bleeding a small amount. Pt states when she passed the tissue, she was bleeding a lot and having some abdominal cramping but the bleeding has slowed down.

## 2019-05-29 ENCOUNTER — Encounter (HOSPITAL_COMMUNITY): Payer: Self-pay | Admitting: Obstetrics and Gynecology

## 2019-05-29 ENCOUNTER — Inpatient Hospital Stay (HOSPITAL_COMMUNITY): Payer: Self-pay

## 2019-05-29 LAB — WET PREP, GENITAL
Sperm: NONE SEEN
Trich, Wet Prep: NONE SEEN
Yeast Wet Prep HPF POC: NONE SEEN

## 2019-05-29 LAB — HCG, QUANTITATIVE, PREGNANCY: hCG, Beta Chain, Quant, S: 9949 m[IU]/mL — ABNORMAL HIGH (ref <5)

## 2019-05-29 LAB — CBC
HCT: 37.7 % (ref 36.0–46.0)
Hemoglobin: 12.7 g/dL (ref 12.0–15.0)
MCH: 29.3 pg (ref 26.0–34.0)
MCHC: 33.7 g/dL (ref 30.0–36.0)
MCV: 86.9 fL (ref 80.0–100.0)
Platelets: 189 10*3/uL (ref 150–400)
RBC: 4.34 MIL/uL (ref 3.87–5.11)
RDW: 13.1 % (ref 11.5–15.5)
WBC: 11.8 10*3/uL — ABNORMAL HIGH (ref 4.0–10.5)
nRBC: 0 % (ref 0.0–0.2)

## 2019-05-29 LAB — GC/CHLAMYDIA PROBE AMP (~~LOC~~) NOT AT ARMC
Chlamydia: NEGATIVE
Comment: NEGATIVE
Comment: NORMAL
Neisseria Gonorrhea: NEGATIVE

## 2019-05-29 LAB — HIV ANTIBODY (ROUTINE TESTING W REFLEX): HIV Screen 4th Generation wRfx: NONREACTIVE

## 2019-05-29 NOTE — Discharge Instructions (Signed)
  Amenaza de aborto Threatened Miscarriage  La amenaza de aborto espontneo se produce cuando hay hemorragia vaginal durante las primeras 20semanas de embarazo, pero el embarazo no se interrumpe. El mdico le har pruebas para asegurarse de que el embarazo contine. La causa de la hemorragia puede ser desconocida. Esta afeccin no significa que el embarazo se interrumpir, pero s aumenta el riesgo de que suceda (aborto espontneo completo). Siga estas indicaciones en su casa:  Descanse lo suficiente.  Si tiene hemorragia vaginal, no tenga relaciones sexuales ni use tampones.  No se haga duchas vaginales.  No fume ni consuma drogas.  No beba alcohol.  Evite la cafena.  Concurra a todas las visitas de control prenatal como se lo haya indicado el mdico. Esto es importante. Comunquese con un mdico si:  Tiene una hemorragia leve de la vagina.  Tiene dolor o clicos abdominales.  Tiene fiebre. Solicite ayuda de inmediato si:  Tiene hemorragia abundante de la vagina.  Elimina cogulos de sangre por la vagina.  Elimina tejido por la vagina.  Tiene una prdida de lquido por la vagina.  Tiene una prdida de lquido por la vagina.  Tiene dolor muy intenso en el abdomen o la parte baja de la espalda, o clicos abdominales o calambres en la parte baja de la espalda.  Tiene fiebre, escalofros y dolor muy intenso en el vientre. Resumen  La amenaza de aborto espontneo se produce cuando hay hemorragia vaginal durante las primeras 20semanas de embarazo, pero el embarazo no se interrumpe.  Esta afeccin no significa que el embarazo se interrumpir, pero s aumenta el riesgo de que suceda (aborto espontneo completo).  Descanse lo suficiente. Si tiene hemorragia vaginal, no tenga relaciones sexuales ni use tampones.  Concurra a todas las visitas de control prenatal como se lo haya indicado el mdico. Esto es importante. Esta informacin no tiene como fin reemplazar el consejo  del mdico. Asegrese de hacerle al mdico cualquier pregunta que tenga. Document Revised: 10/30/2016 Document Reviewed: 10/30/2016 Elsevier Patient Education  2020 Elsevier Inc.  

## 2019-05-31 ENCOUNTER — Inpatient Hospital Stay (HOSPITAL_COMMUNITY)
Admission: AD | Admit: 2019-05-31 | Discharge: 2019-05-31 | Disposition: A | Discharge status: Home / Self Care | Payer: Self-pay | Attending: Family Medicine | Admitting: Family Medicine

## 2019-05-31 ENCOUNTER — Other Ambulatory Visit: Payer: Self-pay

## 2019-05-31 DIAGNOSIS — Z3A01 Less than 8 weeks gestation of pregnancy: Secondary | ICD-10-CM | POA: Insufficient documentation

## 2019-05-31 DIAGNOSIS — O039 Complete or unspecified spontaneous abortion without complication: Secondary | ICD-10-CM | POA: Insufficient documentation

## 2019-05-31 DIAGNOSIS — R109 Unspecified abdominal pain: Secondary | ICD-10-CM | POA: Insufficient documentation

## 2019-05-31 DIAGNOSIS — O26891 Other specified pregnancy related conditions, first trimester: Secondary | ICD-10-CM | POA: Insufficient documentation

## 2019-05-31 LAB — HCG, QUANTITATIVE, PREGNANCY: hCG, Beta Chain, Quant, S: 1035 m[IU]/mL — ABNORMAL HIGH (ref <5)

## 2019-05-31 NOTE — Discharge Instructions (Signed)
Aborto espontneo Miscarriage El aborto espontneo es la prdida de un beb que no ha nacido (feto) antes de la semana20 del embarazo. Siga estas indicaciones en su casa: Medicamentos   Tome los medicamentos de venta libre y los recetados solamente como se lo haya indicado el mdico.  Si le recetaron un antibitico, tmelo como se lo haya indicado el mdico. No deje de tomar los antibiticos aunque comience a sentirse mejor.  No tome antiinflamatorios no esteroideos (AINE), a menos que el mdico le diga que son seguros para usted. Estos incluyen aspirina e ibuprofeno. Estos medicamentos pueden provocarle sangrado. Actividad  Haga reposo segn lo indicado. Pregntele al mdico qu actividades son seguras para usted.  Pida ayuda para realizar las tareas de la casa durante este tiempo. Instrucciones generales  Anote cuntos apsitos usa por da y cun saturados estn.  Observe la cantidad de tejido o grumos de sangre (cogulos de sangre) que expulsa por la vagina. Guarde las cantidades grandes de tejido para llevrselas al mdico.  No use tampones, no se haga duchas vaginales ni tenga relaciones sexuales hasta que el mdico la autorice.  Para que usted y su pareja puedan sobrellevar el proceso de duelo, hable con su mdico o busque apoyo psicolgico.  Cuando est lista, acuda al mdico para hablar sobre los pasos que debe seguir para cuidar su salud. Adems, hable con su mdico sobre las medidas que debe adoptar para tener un embarazo saludable en el futuro.  Concurra a todas las visitas de seguimiento como se lo haya indicado el mdico. Esto es importante. Comunquese con un mdico si:  Tiene fiebre o siente escalofros.  Tiene una secrecin vaginal con mal olor.  Aumenta el sangrado. Solicite ayuda de inmediato si:  Tiene espasmos o dolor muy intensos en el abdomen o en la espalda.  Elimina grumos de sangre por la vagina, que tienen el tamao de una nuez o ms.  Elimina  tejido por la vagina, que tiene el tamao de una nuez o ms.  Empapa ms de un apsito de tamao normal por hora.  Se siente dbil o mareada.  Pierde el conocimiento (se desmaya).  Siente tristeza que no se va o piensa en lastimarse. Resumen  El aborto espontneo es la prdida de un beb que no ha nacido antes de la semana20 del embarazo.  Siga las indicaciones de su mdico para el cuidado en su hogar. Concurra a todas las visitas de control.  Para que usted y su pareja puedan sobrellevar el proceso de duelo, hable con su mdico o busque apoyo psicolgico. Esta informacin no tiene como fin reemplazar el consejo del mdico. Asegrese de hacerle al mdico cualquier pregunta que tenga. Document Revised: 12/31/2016 Document Reviewed: 12/31/2016 Elsevier Patient Education  2020 Elsevier Inc.  

## 2019-05-31 NOTE — MAU Provider Note (Signed)
Subjective:  [name] is a 39 y.o. [phone] at [redacted]w[redacted]d who presents today for FU BHCG. She was seen on 2/18. Results from that day show no IUP on [place], and HCG 9,949. She denies vaginal bleeding. She denies abdominal or pelvic pain. Interpretor present.   Objective:  Physical Exam  Nursing note and vitals reviewed. Constitutional: She is oriented to person, place, and time. She appears well-developed and well-nourished. No distress.  HENT:  Head: Normocephalic.  Cardiovascular: Normal rate.  Respiratory: Effort normal.  GI: Soft. There is no tenderness.  Neurological: She is alert and oriented to person, place, and time. Skin: Skin is warm and dry.  Psychiatric: She has a normal mood and affect.   Results for orders placed or performed during the hospital encounter of 05/31/19 (from the past 24 hour(s))  hCG, quantitative, pregnancy     Status: Abnormal   Collection Time: 05/31/19  1:51 PM  Result Value Ref Range   hCG, Beta Chain, Quant, S 1,035 (H) <5 mIU/mL    Assessment/Plan: SAB HCG did not rise appropriately FU in 1 weeks for: for Quant in the office. Message sent to [facility] to schedule Bleeding precautions O positive blood type Support given  Duane Lope, NP 05/31/2019 4:50 PM ]

## 2019-05-31 NOTE — MAU Note (Signed)
[name] is a 39 y.o. at [redacted]w[redacted]d here in MAU reporting:  Was seen Thursday night. "I thought I had my period. A lot of abdominal pain." When she went to the bathroom a "meaty ball" of blood.  Sent to MAU from ED then. Was told to come back today to for further studies. Patient states she was unsure if she had a miscarriage.  Still having vaginal bleeding, clots, and lower abdominal cramping as before.  Pain score: 3-4/10 Vitals:   05/31/19 1339  BP: 120/72  Pulse: 64  Resp: 16  Temp: 98.2 F (36.8 C)  SpO2: 100%

## 2019-05-31 NOTE — MAU Note (Signed)
Blanche East NP made aware of patient arrival. Patient to wait in lobby after lab draw for results.

## 2019-06-01 LAB — SURGICAL PATHOLOGY

## 2019-06-02 ENCOUNTER — Encounter: Payer: Self-pay | Admitting: Advanced Practice Midwife

## 2019-06-02 DIAGNOSIS — O039 Complete or unspecified spontaneous abortion without complication: Secondary | ICD-10-CM | POA: Insufficient documentation

## 2019-06-05 ENCOUNTER — Other Ambulatory Visit: Payer: Self-pay

## 2019-06-05 DIAGNOSIS — O039 Complete or unspecified spontaneous abortion without complication: Secondary | ICD-10-CM

## 2019-06-06 LAB — BETA HCG QUANT (REF LAB): hCG Quant: 129 m[IU]/mL

## 2019-06-12 ENCOUNTER — Other Ambulatory Visit: Payer: Self-pay

## 2019-11-04 ENCOUNTER — Ambulatory Visit: Payer: Self-pay

## 2019-11-04 DIAGNOSIS — Z3480 Encounter for supervision of other normal pregnancy, unspecified trimester: Secondary | ICD-10-CM | POA: Insufficient documentation

## 2019-11-04 MED ORDER — BLOOD PRESSURE KIT DEVI: 1.0000 | 0 refills | Status: DC | PRN

## 2019-11-04 NOTE — Progress Notes (Signed)
Patient was assessed and managed by nursing staff during this encounter. I have reviewed the chart and agree with the documentation and plan. I have also made any necessary editorial changes.  Diarra Kos A Brinae Woods, MD 11/04/2019 1:43 PM   

## 2019-11-04 NOTE — Progress Notes (Signed)
..    Virtual Visit via Telephone Note  I connected with [name] on 11/04/19 at  9:00 AM EDT by telephone and verified that I am speaking with the correct person using two identifiers.  Location: [facility] Patient: [name] Provider: Nurse visit   I discussed the limitations, risks, security and privacy concerns of performing an evaluation and management service by telephone and the availability of in person appointments. I also discussed with the patient that there may be a patient responsible charge related to this service. The patient expressed understanding and agreed to proceed.   History of Present Illness: PRENATAL INTAKE SUMMARY  [name] presents today New OB Nurse Interview.  OB History    Gravida  7   Para  5   Term  3   Preterm  2   AB  1   Living  4     SAB  1   TAB  0   Ectopic  0   Multiple  0   Live Births  4          I have reviewed the patient's medical, obstetrical, social, and family histories, medications, and available lab results.  SUBJECTIVE She has no unusual complaints   Observations/Objective: Initial nurse interview for history/labs (New OB)  EDD: 06-01-19 GA: [redacted]w[redacted]d GP: G7P4   GENERAL APPEARANCE: alert, well appearing  Assessment and Plan: Normal pregnancy Pt had no complaints today. Pt reports hx of stillbirth at 36 weeks in 2000, and was induced at 36 weeks in 2002 to prevent similar complications. BP cuff [facility]  Follow Up Instructions:   I discussed the assessment and treatment plan with the patient. The patient was provided an opportunity to ask questions and all were answered. The patient agreed with the plan and demonstrated an understanding of the instructions.   The patient was advised to call back or seek an in-person evaluation if the symptoms worsen or if the condition fails to improve as anticipated.  I provided 15 minutes of non-face-to-face time during this  encounter.   Katrina Stack, RN

## 2019-11-11 ENCOUNTER — Other Ambulatory Visit (HOSPITAL_COMMUNITY)
Admission: RE | Admit: 2019-11-11 | Discharge: 2019-11-11 | Disposition: A | Discharge status: Home / Self Care | Payer: Self-pay | Source: Ambulatory Visit | Attending: Obstetrics and Gynecology | Admitting: Obstetrics and Gynecology

## 2019-11-11 ENCOUNTER — Encounter: Payer: Self-pay | Admitting: Obstetrics and Gynecology

## 2019-11-11 ENCOUNTER — Other Ambulatory Visit: Payer: Self-pay

## 2019-11-11 ENCOUNTER — Ambulatory Visit (INDEPENDENT_AMBULATORY_CARE_PROVIDER_SITE_OTHER): Payer: Self-pay | Admitting: Obstetrics and Gynecology

## 2019-11-11 VITALS — BP 125/72 | HR 81 | Wt 145.0 lb

## 2019-11-11 DIAGNOSIS — Z3480 Encounter for supervision of other normal pregnancy, unspecified trimester: Secondary | ICD-10-CM

## 2019-11-11 DIAGNOSIS — Z3A15 15 weeks gestation of pregnancy: Secondary | ICD-10-CM

## 2019-11-11 DIAGNOSIS — Z8759 Personal history of other complications of pregnancy, childbirth and the puerperium: Secondary | ICD-10-CM

## 2019-11-11 DIAGNOSIS — Z3482 Encounter for supervision of other normal pregnancy, second trimester: Secondary | ICD-10-CM

## 2019-11-11 DIAGNOSIS — Z789 Other specified health status: Secondary | ICD-10-CM

## 2019-11-11 MED ORDER — ASPIRIN 81 MG PO CHEW: 81.0000 mg | CHEWABLE_TABLET | Freq: Every day | ORAL | Status: DC

## 2019-11-11 NOTE — Patient Instructions (Signed)
Segundo trimestre de embarazo Second Trimester of Pregnancy  El segundo trimestre va desde la semana14 hasta la 27 (desde el mes 4 hasta el 6). Este suele ser el momento en el que mejor se siente. En general, las nuseas matutinas han disminuido o han desaparecido completamente. Tendr ms energa y podr aumentarle el apetito. El beb en gestacin se desarrolla rpidamente. Hacia el final del sexto mes, el beb mide aproximadamente 9 pulgadas (23 cm) y pesa alrededor de 1 libras (700 g). Es probable que sienta al beb moverse entre las 18 y 20 semanas del embarazo. Siga estas indicaciones en su casa: Medicamentos  Tome los medicamentos de venta libre y los recetados solamente como se lo haya indicado el mdico. Algunos medicamentos son seguros para tomar durante el embarazo y otros no lo son.  Tome vitaminas prenatales que contengan por lo menos 600microgramos (?g) de cido flico.  Si tiene dificultad para mover el intestino (estreimiento), tome un medicamento para ablandar las heces (laxante) si su mdico se lo autoriza. Comida y bebida   Ingiera alimentos saludables de manera regular.  No coma carne cruda ni quesos sin cocinar.  Si obtiene poca cantidad de calcio de los alimentos que ingiere, consulte a su mdico sobre la posibilidad de tomar un suplemento diario de calcio.  Evite el consumo de alimentos ricos en grasas y azcares, como los alimentos fritos y los dulces.  Si tiene malestar estomacal (nuseas) o devuelve (vomita): ? Ingiera 4 o 5comidas pequeas por da en lugar de 3abundantes. ? Intente comer algunas galletitas saladas. ? Beba lquidos entre las comidas, en lugar de hacerlo durante estas.  Para evitar el estreimiento: ? Consuma alimentos ricos en fibra, como frutas y verduras frescas, cereales integrales y frijoles. ? Beba suficiente lquido para mantener el pis (orina) claro o de color amarillo plido. Actividad  Haga ejercicios solamente como se lo haya  indicado el mdico. Interrumpa la actividad fsica si comienza a tener calambres.  No haga ejercicio si hace demasiado calor, hay demasiada humedad o se encuentra en un lugar de mucha altura (altitud alta).  Evite levantar pesos excesivos.  Use zapatos con tacones bajos. Mantenga una buena postura al sentarse y pararse.  Puede continuar teniendo relaciones sexuales, a menos que el mdico le indique lo contrario. Alivio del dolor y del malestar  Use un sostn que le brinde buen soporte si sus mamas estn sensibles.  Dese baos de asiento con agua tibia para aliviar el dolor o las molestias causadas por las hemorroides. Use una crema para las hemorroides si el mdico la autoriza.  Descanse con las piernas elevadas si tiene calambres o dolor de cintura.  Si desarrolla venas hinchadas y abultadas (vrices) en las piernas: ? Use medias de compresin o medias de descanso como se lo haya indicado el mdico. ? Levante (eleve) los pies durante 15minutos, 3 o 4veces por da. ? Limite el consumo de sal en sus alimentos. Cuidado prenatal  Escriba sus preguntas. Llvelas cuando concurra a las visitas prenatales.  Concurra a todas las visitas prenatales como se lo haya indicado el mdico. Esto es importante. Seguridad  Colquese el cinturn de seguridad cuando conduzca.  Haga una lista de los nmeros de telfono de emergencia, que incluya los nmeros de telfono de familiares, amigos, el hospital, as como los departamentos de polica y bomberos. Instrucciones generales  Consulte a su mdico sobre los alimentos que debe comer o pdale que la ayude a encontrar a quien pueda aconsejarla si necesita ese servicio.    Consulte a su mdico acerca de dnde se dictan clases prenatales cerca de donde vive. Comience las clases antes del mes 6 de embarazo.  No se d baos de inmersin en agua caliente, baos turcos ni saunas.  No se haga duchas vaginales ni use tampones o toallas higinicas perfumadas.   No mantenga las piernas cruzadas durante mucho tiempo.  Vaya al dentista si an no lo hizo. Use un cepillo de cerdas suaves para cepillarse los dientes. Psese el hilo dental suavemente.  No fume, no consuma hierbas ni beba alcohol. No tome frmacos que el mdico no haya autorizado.  No consuma ningn producto que contenga nicotina o tabaco, como cigarrillos y cigarrillos electrnicos. Si necesita ayuda para dejar de fumar, consulte al mdico.  Evite el contacto con las bandejas sanitarias de los gatos y la tierra que estos animales usan. Estos elementos contienen bacterias que pueden causar defectos congnitos al beb y la posible prdida del beb (aborto espontneo) o la muerte fetal. Comunquese con un mdico si:  Tiene clicos leves o siente presin en la parte baja del vientre.  Tiene dolor al hacer pis (orinar).  Advierte un lquido con olor ftido que proviene de la vagina.  Tiene malestar estomacal (nuseas), devuelve (vomita) o tiene deposiciones acuosas (diarrea).  Sufre un dolor persistente en el abdomen.  Siente mareos. Solicite ayuda de inmediato si:  Tiene fiebre.  Tiene una prdida de lquido por la vagina.  Tiene sangrado o pequeas prdidas vaginales.  Siente dolor intenso o clicos en el abdomen.  Sube o baja de peso rpidamente.  Tiene dificultades para recuperar el aliento y siente dolor en el pecho.  Sbitamente se le hinchan mucho el rostro, las manos, los tobillos, los pies o las piernas.  No ha sentido los movimientos del beb durante una hora.  Siente un dolor de cabeza intenso que no se alivia al tomar medicamentos.  Tiene dificultad para ver. Resumen  El segundo trimestre va desde la semana14 hasta la 27, desde el mes 4 hasta el 6. Este suele ser el momento en el que mejor se siente.  Para cuidarse y cuidar a su beb en gestacin, debe comer alimentos saludables, tomar medicamentos solamente si su mdico le indica que lo haga y hacer  actividades que sean seguras para usted y su beb.  Llame al mdico si se enferma o si nota algo inusual acerca de su embarazo. Tambin llame al mdico si necesita ayuda para saber qu alimentos debe comer o si quiere saber qu actividades puede realizar de forma segura. Esta informacin no tiene como fin reemplazar el consejo del mdico. Asegrese de hacerle al mdico cualquier pregunta que tenga. Document Revised: 12/19/2016 Document Reviewed: 12/19/2016 Elsevier Patient Education  2020 Elsevier Inc.  

## 2019-11-11 NOTE — Progress Notes (Signed)
 INITIAL PRENATAL VISIT NOTE  Subjective:  [name] is a 39 y.o. G7P3214 at [redacted]w[redacted]d by sure LMP being seen today for her initial prenatal visit. This is a unplanned pregnancy.  She was using nothing for birth control previously. She has an obstetric history significant for stillbirth in 2000. She has a medical history significant for gallbladder disease.  Patient reports mild cramping.  Contractions: Not present.  .   . Denies leaking of fluid.    Past Medical History:  Diagnosis Date  . Medical history non-contributory     Past Surgical History:  Procedure Laterality Date  . APPENDECTOMY    . CHOLECYSTECTOMY  12/15/2014   Procedure: LAPAROSCOPIC CHOLECYSTECTOMY, attempted cholangiogram;  Surgeon: Burke Thompson, MD;  Location: [facility] OR;  Service: General;;  . LAPAROSCOPIC APPENDECTOMY  04/05/2012   Procedure: APPENDECTOMY LAPAROSCOPIC;  Surgeon: Brian Layton, DO;  Location: [facility];  Service: General;  Laterality: N/A;    OB History  Gravida Para Term Preterm AB Living  7 5 3 2 1 4  SAB TAB Ectopic Multiple Live Births  1 0 0 0 4    # Outcome Date GA Lbr Len/2nd Weight Sex Delivery Anes PTL Lv  7 Current           6 SAB 05/2019 [redacted]w[redacted]d         5 Term 10/08/13 [redacted]w[redacted]d 12:15 / 01:53 7 lb 7.4 oz (3.385 kg) M Vag-Spont Other  LIV  4 Term 01/30/07 [redacted]w[redacted]d  6.5 oz (0.184 kg) M Vag-Spont EPI  LIV  3 Term 2004 [redacted]w[redacted]d 02:00 7 lb 5 oz (3.317 kg) M Vag-Spont   LIV  2 Preterm 07/29/00 [redacted]w[redacted]d  6 lb 9.8 oz (3 kg) F Vag-Spont None  LIV  1 Preterm 2000 [redacted]w[redacted]d   F Vag-Spont   FD    Social History   Socioeconomic History  . Marital status: Single    Spouse name: Not on file  . Number of children: Not on file  . Years of education: Not on file  . Highest education level: Not on file  Occupational History  . Not on file  Tobacco Use  . Smoking status: Never Smoker  . Smokeless tobacco: Never Used  Substance and Sexual Activity  . Alcohol use: No  . Drug use: No  . Sexual activity: Not  Currently    Birth control/protection: None  Other Topics Concern  . Not on file  Social History Narrative  . Not on file   Social Determinants of Health   Financial Resource Strain:   . Difficulty of Paying Living Expenses:   Food Insecurity:   . Worried About Running Out of Food in the Last Year:   . Ran Out of Food in the Last Year:   Transportation Needs:   . Lack of Transportation (Medical):   . Lack of Transportation (Non-Medical):   Physical Activity:   . Days of Exercise per Week:   . Minutes of Exercise per Session:   Stress:   . Feeling of Stress :   Social Connections:   . Frequency of Communication with Friends and Family:   . Frequency of Social Gatherings with Friends and Family:   . Attends Religious Services:   . Active Member of Clubs or Organizations:   . Attends Club or Organization Meetings:   . Marital Status:     History reviewed. No pertinent family history.   Current Outpatient Medications:  .  Prenatal Vit-Fe Fumarate-FA (MULTIVITAMIN-PRENATAL) 27-0.8 MG TABS tablet,   Take 1 tablet by mouth daily at 12 noon., Disp: , Rfl:  .  Blood Pressure Monitoring (BLOOD PRESSURE KIT) DEVI, 1 kit by Does not apply route as needed., Disp: 1 each, Rfl: 0  Current Facility-Administered Medications:  .  aspirin chewable tablet 81 mg, 81 mg, Oral, Daily, Bass, Lawrence A, MD  No Known Allergies  Review of Systems: Negative except for what is mentioned in HPI.  Objective:   Vitals:   11/11/19 1009  BP: 125/72  Pulse: 81  Weight: 145 lb (65.8 kg)    Fetal Status: Fetal Heart Rate (bpm): 160         Physical Exam: BP 125/72   Pulse 81   Wt 145 lb (65.8 kg)   LMP 07/26/2019   BMI 28.32 kg/m  CONSTITUTIONAL: Well-developed, well-nourished female in no acute distress.  NEUROLOGIC: Alert and oriented to person, place, and time. Normal reflexes, muscle tone coordination. No cranial nerve deficit noted. PSYCHIATRIC: Normal mood and affect. Normal  behavior. Normal judgment and thought content. SKIN: Skin is warm and dry. No rash noted. Not diaphoretic. No erythema. No pallor. HENT:  Normocephalic, atraumatic, External right and left ear normal. Oropharynx is clear and moist EYES: Conjunctivae and EOM are normal. Pupils are equal, round, and reactive to light. No scleral icterus.  NECK: Normal range of motion, supple, no masses CARDIOVASCULAR: Normal heart rate noted, regular rhythm RESPIRATORY: Effort and breath sounds normal, no problems with respiration noted BREASTS: symmetric, non-tender, no masses palpable ABDOMEN: Soft, nontender, nondistended, gravid. GU: normal appearing external female genitalia, multiparous appearing cervix, scant white discharge in vagina, no lesions noted Bimanual: 15 weeks sized uterus, no adnexal tenderness or palpable lesions noted MUSCULOSKELETAL: Normal range of motion. EXT:  No edema and no tenderness. 2+ distal pulses.   Assessment and Plan:  Pregnancy: G7P3214 at [redacted]w[redacted]d by sure LMP  1. History of stillbirth Initiate testing at 32 weeks with delivery at 39 unless MFM changes plan - aspirin chewable tablet 81 mg  2. Supervision of other normal pregnancy, antepartum Anatomy scan at 19 weeks ordered - Cytology - PAP( [place]) - CBC/D/Plt+RPR+Rh+ABO+Rub Ab... - Cervicovaginal ancillary only( [place]) - Culture, OB Urine - US MFM OB DETAIL +14 WK; Future  3. Language barrier Interpreter present   Preterm labor symptoms and general obstetric precautions including but not limited to vaginal bleeding, contractions, leaking of fluid and fetal movement were reviewed in detail with the patient.  Please refer to After Visit Summary for other counseling recommendations.   Return in about 4 weeks (around 12/09/2019) for ROB, in person.  Lawrence A Bass 11/11/2019 11:54 AM 

## 2019-11-11 NOTE — Progress Notes (Signed)
Pt states she has some cramping and lower pelvic pain.

## 2019-11-12 LAB — CERVICOVAGINAL ANCILLARY ONLY
Chlamydia: NEGATIVE
Comment: NEGATIVE
Comment: NEGATIVE
Comment: NORMAL
Neisseria Gonorrhea: NEGATIVE
Trichomonas: NEGATIVE

## 2019-11-13 LAB — CBC/D/PLT+RPR+RH+ABO+RUB AB...
Antibody Screen: NEGATIVE
Basophils Absolute: 0 10*3/uL (ref 0.0–0.2)
Basos: 0 %
EOS (ABSOLUTE): 0.1 10*3/uL (ref 0.0–0.4)
Eos: 1 %
HCV Ab: <0.1 s/co ratio (ref 0.0–0.9)
HIV Screen 4th Generation wRfx: NONREACTIVE
Hematocrit: 38.4 % (ref 34.0–46.6)
Hemoglobin: 12.7 g/dL (ref 11.1–15.9)
Hepatitis B Surface Ag: NEGATIVE
Immature Grans (Abs): 0 10*3/uL (ref 0.0–0.1)
Immature Granulocytes: 0 %
Lymphocytes Absolute: 1.3 10*3/uL (ref 0.7–3.1)
Lymphs: 15 %
MCH: 28.6 pg (ref 26.6–33.0)
MCHC: 33.1 g/dL (ref 31.5–35.7)
MCV: 87 fL (ref 79–97)
Monocytes Absolute: 0.5 10*3/uL (ref 0.1–0.9)
Monocytes: 6 %
Neutrophils Absolute: 6.9 10*3/uL (ref 1.4–7.0)
Neutrophils: 78 %
Platelets: 295 10*3/uL (ref 150–450)
RBC: 4.44 x10E6/uL (ref 3.77–5.28)
RDW: 15.2 % (ref 11.7–15.4)
RPR Ser Ql: NONREACTIVE
Rh Factor: POSITIVE
Rubella Antibodies, IGG: 3.14 index (ref >0.99)
WBC: 8.9 10*3/uL (ref 3.4–10.8)

## 2019-11-13 LAB — CYTOLOGY - PAP
Comment: NEGATIVE
Diagnosis: NEGATIVE
High risk HPV: NEGATIVE

## 2019-11-13 LAB — HCV INTERPRETATION

## 2019-11-15 LAB — CULTURE, OB URINE

## 2019-11-15 LAB — URINE CULTURE, OB REFLEX

## 2019-11-17 ENCOUNTER — Telehealth: Payer: Self-pay

## 2019-11-17 MED ORDER — NITROFURANTOIN MONOHYD MACRO 100 MG PO CAPS: 100.0000 mg | ORAL_CAPSULE | Freq: Two times a day (BID) | ORAL | 0 refills | Status: DC

## 2019-11-17 NOTE — Telephone Encounter (Signed)
S/w pt and advised of results and rx sent. 

## 2019-12-09 ENCOUNTER — Encounter: Payer: Self-pay | Admitting: Obstetrics and Gynecology

## 2019-12-09 ENCOUNTER — Ambulatory Visit (INDEPENDENT_AMBULATORY_CARE_PROVIDER_SITE_OTHER): Payer: Self-pay | Admitting: Obstetrics and Gynecology

## 2019-12-09 ENCOUNTER — Other Ambulatory Visit: Payer: Self-pay

## 2019-12-09 VITALS — BP 117/73 | HR 76 | Wt 146.9 lb

## 2019-12-09 DIAGNOSIS — Z789 Other specified health status: Secondary | ICD-10-CM

## 2019-12-09 DIAGNOSIS — Z7189 Other specified counseling: Secondary | ICD-10-CM

## 2019-12-09 DIAGNOSIS — Z3A19 19 weeks gestation of pregnancy: Secondary | ICD-10-CM

## 2019-12-09 DIAGNOSIS — Z8759 Personal history of other complications of pregnancy, childbirth and the puerperium: Secondary | ICD-10-CM

## 2019-12-09 DIAGNOSIS — Z3480 Encounter for supervision of other normal pregnancy, unspecified trimester: Secondary | ICD-10-CM

## 2019-12-09 NOTE — Progress Notes (Signed)
   PRENATAL VISIT NOTE  Subjective:  [name] is a 39 y.o. [phone] at [redacted]w[redacted]d being seen today for ongoing prenatal care.  She is currently monitored for the following issues for this high-risk pregnancy and has History of stillbirth; Supervision of other normal pregnancy, antepartum; and Language barrier on their problem list.  Patient reports no complaints.  Contractions: Not present. Vag. Bleeding: None.  Movement: Present. Denies leaking of fluid.   The following portions of the patient's history were reviewed and updated as appropriate: allergies, current medications, past family history, past medical history, past social history, past surgical history and problem list.   Objective:   Vitals:   12/09/19 1020  BP: 117/73  Pulse: 76  Weight: 146 lb 14.4 oz (66.6 kg)    Fetal Status: Fetal Heart Rate (bpm): 145   Movement: Present     General:  Alert, oriented and cooperative. Patient is in no acute distress.  Skin: Skin is warm and dry. No rash noted.   Cardiovascular: Normal heart rate noted  Respiratory: Normal respiratory effort, no problems with respiration noted  Abdomen: Soft, gravid, appropriate for gestational age.  Pain/Pressure: Absent     Pelvic: Cervical exam deferred        Extremities: Normal range of motion.  Edema: None  Mental Status: Normal mood and affect. Normal behavior. Normal judgment and thought content.   Assessment and Plan:  Pregnancy: [id number] at [redacted]w[redacted]d  1. Supervision of other normal pregnancy, antepartum AFP today  2. History of stillbirth Will need testing in 3rd trim  3. Language barrier Spanish translatory used  4. [redacted] weeks gestation of pregnancy  5. Counseled about COVID-19 virus infection The patient was counseled on the potential benefits and lack of known risks of COVID vaccination, during pregnancy and breastfeeding, on today's visit. The patient's questions and concerns were addressed today, including none. The patient is  not planning to get vaccinated at this time.   Preterm labor symptoms and general obstetric precautions including but not limited to vaginal bleeding, contractions, leaking of fluid and fetal movement were reviewed in detail with the patient. Please refer to After Visit Summary for other counseling recommendations.   Return for in person, high OB.  Future Appointments  Date Time Provider Department Center  12/15/2019  8:15 AM [facility] NURSE [facility]  12/15/2019  8:30 AM [facility] US3 [facility]    Conan Bowens, MD

## 2019-12-09 NOTE — Progress Notes (Signed)
Pt is here for ROB, [redacted]w[redacted]d. AFP today.

## 2019-12-11 LAB — AFP, SERUM, OPEN SPINA BIFIDA
AFP MoM: 0.37
AFP Value: 20.9 ng/mL
Gest. Age on Collection Date: 19.6 weeks
Maternal Age At EDD: 40.1 yr
OSBR Risk 1 IN: 10000
Test Results:: NEGATIVE
Weight: 146 [lb_av]

## 2019-12-15 ENCOUNTER — Ambulatory Visit: Payer: Self-pay | Attending: Obstetrics and Gynecology

## 2019-12-15 ENCOUNTER — Encounter: Payer: Self-pay | Admitting: *Deleted

## 2019-12-15 ENCOUNTER — Other Ambulatory Visit: Payer: Self-pay | Admitting: Obstetrics and Gynecology

## 2019-12-15 ENCOUNTER — Other Ambulatory Visit: Payer: Self-pay | Admitting: *Deleted

## 2019-12-15 ENCOUNTER — Other Ambulatory Visit: Payer: Self-pay

## 2019-12-15 ENCOUNTER — Ambulatory Visit: Payer: Self-pay | Admitting: *Deleted

## 2019-12-15 VITALS — BP 108/70 | HR 76

## 2019-12-15 DIAGNOSIS — O09529 Supervision of elderly multigravida, unspecified trimester: Secondary | ICD-10-CM | POA: Insufficient documentation

## 2019-12-15 DIAGNOSIS — Z3480 Encounter for supervision of other normal pregnancy, unspecified trimester: Secondary | ICD-10-CM

## 2019-12-15 DIAGNOSIS — Z362 Encounter for other antenatal screening follow-up: Secondary | ICD-10-CM

## 2020-01-06 ENCOUNTER — Other Ambulatory Visit: Payer: Self-pay

## 2020-01-06 ENCOUNTER — Ambulatory Visit (INDEPENDENT_AMBULATORY_CARE_PROVIDER_SITE_OTHER): Payer: Self-pay | Admitting: Obstetrics and Gynecology

## 2020-01-06 VITALS — BP 103/69 | HR 75 | Wt 149.0 lb

## 2020-01-06 DIAGNOSIS — Z8759 Personal history of other complications of pregnancy, childbirth and the puerperium: Secondary | ICD-10-CM

## 2020-01-06 DIAGNOSIS — Z789 Other specified health status: Secondary | ICD-10-CM

## 2020-01-06 DIAGNOSIS — Z3480 Encounter for supervision of other normal pregnancy, unspecified trimester: Secondary | ICD-10-CM

## 2020-01-06 DIAGNOSIS — Z3A23 23 weeks gestation of pregnancy: Secondary | ICD-10-CM

## 2020-01-06 MED ORDER — ASPIRIN 81 MG PO CHEW: 81.0000 mg | CHEWABLE_TABLET | Freq: Every day | ORAL | 5 refills | Status: DC

## 2020-01-06 NOTE — Progress Notes (Signed)
   PRENATAL VISIT NOTE  Subjective:  [name] is a 39 y.o. [phone] at [redacted]w[redacted]d being seen today for ongoing prenatal care.  She is currently monitored for the following issues for this high-risk pregnancy and has History of stillbirth; Supervision of other normal pregnancy, antepartum; Language barrier; and [redacted] weeks gestation of pregnancy on their problem list.  Patient doing well with no acute concerns today. She reports no complaints.  Contractions: Not present. Vag. Bleeding: None.  Movement: Present. Denies leaking of fluid.   The following portions of the patient's history were reviewed and updated as appropriate: allergies, current medications, past family history, past medical history, past social history, past surgical history and problem list. Problem list updated.  Objective:   Vitals:   01/06/20 0953  BP: 103/69  Pulse: 75  Weight: 149 lb (67.6 kg)    Fetal Status: Fetal Heart Rate (bpm): 146 Fundal Height: 23 cm Movement: Present     General:  Alert, oriented and cooperative. Patient is in no acute distress.  Skin: Skin is warm and dry. No rash noted.   Cardiovascular: Normal heart rate noted  Respiratory: Normal respiratory effort, no problems with respiration noted  Abdomen: Soft, gravid, appropriate for gestational age.  Pain/Pressure: Present     Pelvic: Cervical exam deferred        Extremities: Normal range of motion.  Edema: None  Mental Status:  Normal mood and affect. Normal behavior. Normal judgment and thought content.   Assessment and Plan:  Pregnancy: [id number] at [redacted]w[redacted]d  1. Supervision of other normal pregnancy, antepartum Pt has repeat growth scan 10/5, 26-28 week labs at next visit - aspirin 81 MG chewable tablet; Chew 1 tablet (81 mg total) by mouth daily.  Dispense: 30 tablet; Refill: 5  2. History of stillbirth Initiate testing at 36 weeks per MFM  3. Language barrier Interpreter present  4. [redacted] weeks gestation of pregnancy   Preterm  labor symptoms and general obstetric precautions including but not limited to vaginal bleeding, contractions, leaking of fluid and fetal movement were reviewed in detail with the patient.  Please refer to After Visit Summary for other counseling recommendations.   Return in about 4 weeks (around 02/03/2020) for 2 hr GTT, 3rd trim labs, in person, HOB.   Mariel Aloe, MD

## 2020-01-06 NOTE — Patient Instructions (Signed)
Prueba de tolerancia a la glucosa durante el embarazo Glucose Tolerance Test During Pregnancy Por qu me debo realizar esta prueba? La prueba de tolerancia a la glucosa (PTG) se realiza para controlar la manera en que el cuerpo procesa el azcar (glucosa). Esta es una de las diferentes pruebas que se usan para diagnosticar la diabetes que se desarrolla durante el embarazo (diabetes mellitus gestacional). La diabetes gestacional es una forma temporal de diabetes que algunas mujeres desarrollan durante el embarazo. Generalmente, se produce durante el segundo trimestre del embarazo y desaparece despus del parto. Los anlisis (pruebas de deteccin) para la diabetes gestacional por lo general se hacen entre las 24 y 28 semanas de embarazo. Se le podr realizar la prueba PTG despus de realizarse una prueba de deteccin de glucosa de 1 hora si los resultados de esa prueba indican que es posible que tenga diabetes gestacional. Tambin pueden hacerle esta prueba si:  Tiene antecedentes de diabetes gestacional.  Tiene antecedentes de haber parido bebs muy grandes o antecedentes de prdida fetal repetida (muerte fetal).  Tiene signos y sntomas de diabetes, tales como: ? Cambios en la visin. ? Hormigueo o adormecimiento en las manos o los pies. ? Cambios en el hambre, la sed y la miccin que no se explican por el embarazo. Qu se analiza? Esta prueba mide la cantidad de glucosa en la sangre en diferentes momentos durante un perodo de 3horas. Esto indica qu tan bien su cuerpo puede procesar la glucosa. Qu tipo de muestra se toma?  Para esta prueba, se extraen muestras de sangre. Por lo general, para extraerlas, se introduce una aguja en un vaso sanguneo. Cmo debo prepararme para esta prueba?  Durante 3 das antes de la prueba, coma normalmente. Coma muchos alimentos con alto contenido de carbohidratos.  Siga las indicaciones del mdico acerca de lo siguiente: ? Restricciones en la comida o  la bebida el da de la prueba. Se le podr pedir que no coma ni beba nada ms que agua (ayuno) desde 8 a 10 horas antes de la prueba. ? Cambiar o suspender los medicamentos que toma habitualmente. Algunos medicamentos pueden interferir en esta prueba. Informe al mdico acerca de lo siguiente:  Todos los medicamentos que utiliza, incluidos vitaminas, hierbas, gotas oftlmicas, cremas y medicamentos de venta libre.  Cualquier enfermedad de la sangre que tenga.  Cirugas a las que se someti.  Cualquier enfermedad que tenga. Qu ocurre durante la prueba? Primero se le medir la glucemia. Esto se denomina glucemia en ayunas, ya que usted hizo ayuno antes de la prueba. Luego beber una solucin de glucosa que contiene una cierta cantidad de glucosa. Se le medir la glucosa en sangre nuevamente 1, 2 y 3 horas despus de beber la solucin. La realizacin de esta prueba lleva alrededor de 3 horas. Durante ese tiempo deber permanecer en el lugar donde se realiza la prueba. Durante el perodo de la prueba:  No coma ni beba nada que no sea la solucin de glucosa.  No haga ejercicios.  No consuma ningn producto que contenga nicotina o tabaco, como cigarrillos y cigarrillos electrnicos. Si necesita ayuda para dejar estos productos, consulte a su mdico. El procedimiento de prueba puede variar segn el mdico y el hospital. Cmo se informan los resultados? Sus resultados se informarn como miligramos de glucosa por decilitro de sangre (mg/dl) o milimoles por litro (mmol/l). El mdico comparar sus resultados con los rangos normales que se establecieron despus de realizar el anlisis a un grupo grande de personas (rangos   de referencia). Los rangos de referencia pueden variar entre laboratorios y hospitales. Los rangos de referencia habituales para esta prueba son los siguientes:  En ayunas: menos de 95 a 105mg/dl (5,3 a 5,8mmol/l).  1 hora despus de beber glucosa: menos de 180 a 190mg/dl (10,0 a  10,5mmol/l).  2 horas despus de beber glucosa: menos de 155 a 165mg/dl (8,6 a 9,2mmol/l).  3 horas despus de beber glucosa: de 140 a 145mg/dl (7,8 a 8,1mmol/l). Qu significan los resultados? Los resultados dentro de los rangos de referencia se consideran normales, lo que significa que sus niveles de glucosa estn bien controlados. Si dos o ms de sus niveles de glucemia estn altos, es posible que le diagnostiquen diabetes gestacional. Si solo un nivel est alto, el mdico podr sugerir repetir el anlisis o hacer otras pruebas para confirmar un diagnstico. Hable con el mdico sobre lo que significan los resultados. Preguntas para hacerle al mdico Consulte a su mdico o pregunte en el departamento donde se realiza la prueba acerca de lo siguiente:  Cundo estarn disponibles mis resultados?  Cmo obtendr mis resultados?  Cules son mis opciones de tratamiento?  Qu otras pruebas necesito?  Cules son los prximos pasos que debo seguir? Resumen  La prueba de tolerancia a la glucosa (PTG) es una de las diferentes pruebas que se usan para diagnosticar la diabetes que se desarrolla durante el embarazo (diabetes mellitus gestacional). La diabetes gestacional es una forma temporal de diabetes que algunas mujeres desarrollan durante el embarazo.  Se le podr realizar la prueba PTG despus de realizarse una prueba de deteccin de glucosa de 1 hora si los resultados de esa prueba indican que es posible que tenga diabetes gestacional. Tambin se le podr realizar esta prueba si tiene algn sntoma o factor de riesgo de diabetes gestacional.  Hable con el mdico sobre lo que significan los resultados. Esta informacin no tiene como fin reemplazar el consejo del mdico. Asegrese de hacerle al mdico cualquier pregunta que tenga. Document Revised: 01/22/2017 Document Reviewed: 01/22/2017 Elsevier Patient Education  2020 Elsevier Inc.  

## 2020-01-06 NOTE — Progress Notes (Signed)
Patient reports fetal movement with occasional pressure. 

## 2020-01-12 ENCOUNTER — Other Ambulatory Visit: Payer: Self-pay | Admitting: *Deleted

## 2020-01-12 ENCOUNTER — Ambulatory Visit: Payer: Self-pay | Attending: Obstetrics and Gynecology

## 2020-01-12 ENCOUNTER — Encounter: Payer: Self-pay | Admitting: *Deleted

## 2020-01-12 ENCOUNTER — Other Ambulatory Visit: Payer: Self-pay

## 2020-01-12 ENCOUNTER — Ambulatory Visit: Payer: Self-pay | Admitting: *Deleted

## 2020-01-12 VITALS — BP 111/57 | HR 83

## 2020-01-12 DIAGNOSIS — O09522 Supervision of elderly multigravida, second trimester: Secondary | ICD-10-CM | POA: Insufficient documentation

## 2020-01-12 DIAGNOSIS — Z3A19 19 weeks gestation of pregnancy: Secondary | ICD-10-CM

## 2020-01-12 DIAGNOSIS — Z8759 Personal history of other complications of pregnancy, childbirth and the puerperium: Secondary | ICD-10-CM

## 2020-01-12 DIAGNOSIS — O0942 Supervision of pregnancy with grand multiparity, second trimester: Secondary | ICD-10-CM

## 2020-01-12 DIAGNOSIS — Z362 Encounter for other antenatal screening follow-up: Secondary | ICD-10-CM | POA: Insufficient documentation

## 2020-01-12 DIAGNOSIS — O09213 Supervision of pregnancy with history of pre-term labor, third trimester: Secondary | ICD-10-CM

## 2020-01-12 DIAGNOSIS — O09293 Supervision of pregnancy with other poor reproductive or obstetric history, third trimester: Secondary | ICD-10-CM

## 2020-02-03 ENCOUNTER — Other Ambulatory Visit: Payer: Self-pay

## 2020-02-03 ENCOUNTER — Ambulatory Visit (INDEPENDENT_AMBULATORY_CARE_PROVIDER_SITE_OTHER): Payer: Self-pay | Admitting: Obstetrics and Gynecology

## 2020-02-03 VITALS — BP 112/73 | HR 88 | Wt 154.0 lb

## 2020-02-03 DIAGNOSIS — Z8759 Personal history of other complications of pregnancy, childbirth and the puerperium: Secondary | ICD-10-CM

## 2020-02-03 DIAGNOSIS — Z3480 Encounter for supervision of other normal pregnancy, unspecified trimester: Secondary | ICD-10-CM

## 2020-02-03 DIAGNOSIS — Z3A23 23 weeks gestation of pregnancy: Secondary | ICD-10-CM

## 2020-02-03 DIAGNOSIS — Z789 Other specified health status: Secondary | ICD-10-CM

## 2020-02-03 NOTE — Progress Notes (Signed)
Pt presents for ROB Reports no compaints today per live interpreter

## 2020-02-03 NOTE — Patient Instructions (Signed)
Prueba de tolerancia a la glucosa durante el embarazo Glucose Tolerance Test During Pregnancy Por qu me debo realizar esta prueba? La prueba de tolerancia a la glucosa (PTG) se realiza para controlar la manera en que el cuerpo procesa el azcar (glucosa). Esta es una de las diferentes pruebas que se usan para diagnosticar la diabetes que se desarrolla durante el embarazo (diabetes mellitus gestacional). La diabetes gestacional es una forma temporal de diabetes que algunas mujeres desarrollan durante el embarazo. Generalmente, se produce durante el segundo trimestre del embarazo y desaparece despus del parto. Los anlisis (pruebas de deteccin) para la diabetes gestacional por lo general se hacen entre las 24 y 28 semanas de embarazo. Se le podr realizar la prueba PTG despus de realizarse una prueba de deteccin de glucosa de 1 hora si los resultados de esa prueba indican que es posible que tenga diabetes gestacional. Tambin pueden hacerle esta prueba si:  Tiene antecedentes de diabetes gestacional.  Tiene antecedentes de haber parido bebs muy grandes o antecedentes de prdida fetal repetida (muerte fetal).  Tiene signos y sntomas de diabetes, tales como: ? Cambios en la visin. ? Hormigueo o adormecimiento en las manos o los pies. ? Cambios en el hambre, la sed y la miccin que no se explican por el embarazo. Qu se analiza? Esta prueba mide la cantidad de glucosa en la sangre en diferentes momentos durante un perodo de 3horas. Esto indica qu tan bien su cuerpo puede procesar la glucosa. Qu tipo de muestra se toma?  Para esta prueba, se extraen muestras de sangre. Por lo general, para extraerlas, se introduce una aguja en un vaso sanguneo. Cmo debo prepararme para esta prueba?  Durante 3 das antes de la prueba, coma normalmente. Coma muchos alimentos con alto contenido de carbohidratos.  Siga las indicaciones del mdico acerca de lo siguiente: ? Restricciones en la comida o  la bebida el da de la prueba. Se le podr pedir que no coma ni beba nada ms que agua (ayuno) desde 8 a 10 horas antes de la prueba. ? Cambiar o suspender los medicamentos que toma habitualmente. Algunos medicamentos pueden interferir en esta prueba. Informe al mdico acerca de lo siguiente:  Todos los medicamentos que utiliza, incluidos vitaminas, hierbas, gotas oftlmicas, cremas y medicamentos de venta libre.  Cualquier enfermedad de la sangre que tenga.  Cirugas a las que se someti.  Cualquier enfermedad que tenga. Qu ocurre durante la prueba? Primero se le medir la glucemia. Esto se denomina glucemia en ayunas, ya que usted hizo ayuno antes de la prueba. Luego beber una solucin de glucosa que contiene una cierta cantidad de glucosa. Se le medir la glucosa en sangre nuevamente 1, 2 y 3 horas despus de beber la solucin. La realizacin de esta prueba lleva alrededor de 3 horas. Durante ese tiempo deber permanecer en el lugar donde se realiza la prueba. Durante el perodo de la prueba:  No coma ni beba nada que no sea la solucin de glucosa.  No haga ejercicios.  No consuma ningn producto que contenga nicotina o tabaco, como cigarrillos y cigarrillos electrnicos. Si necesita ayuda para dejar estos productos, consulte a su mdico. El procedimiento de prueba puede variar segn el mdico y el hospital. Cmo se informan los resultados? Sus resultados se informarn como miligramos de glucosa por decilitro de sangre (mg/dl) o milimoles por litro (mmol/l). El mdico comparar sus resultados con los rangos normales que se establecieron despus de realizar el anlisis a un grupo grande de personas (rangos   de referencia). Los rangos de referencia pueden variar entre laboratorios y hospitales. Los rangos de referencia habituales para esta prueba son los siguientes:  En ayunas: menos de 95 a 105mg /dl (5,3 a ).  1 hora despus de beber glucosa: menos de 180 a 190mg /dl (5,1VOHY/W a  ).  2 horas despus de beber glucosa: menos de 155 a 165mg /dl (8,6 a 10,6YIRS/W).  3 horas despus de beber glucosa: de 140 a 145mg /dl (7,8 a ). Qu significan los resultados? Los 05-31-1982 de los rangos de 5,4OEVO/J se , lo que significa que sus niveles de glucosa estn bien controlados. Si dos o ms de sus niveles de glucemia estn altos, es posible que le diagnostiquen diabetes gestacional. Si solo un nivel est alto, el mdico podr sugerir repetir 5,0KXFG/H o hacer otras pruebas para [employer] un diagnstico. Hable con el mdico sobre lo que significan los [place]. Preguntas para hacerle al mdico Consulte a su mdico o pregunte en el departamento donde se realiza la prueba acerca de lo siguiente:  Cundo estarn disponibles mis resultados?  Cmo obtendr mis resultados?  Cules son mis opciones de tratamiento?  Qu otras pruebas necesito?  Cules son los prximos pasos que debo seguir? Resumen  La prueba de tolerancia a la glucosa (PTG) es una de las diferentes pruebas que se usan para diagnosticar la diabetes que se desarrolla durante el embarazo (diabetes mellitus gestacional). La diabetes gestacional es una forma temporal de diabetes que algunas mujeres desarrollan durante el embarazo.  Se le podr realizar la prueba PTG despus de realizarse una prueba de deteccin de glucosa de 1 hora si los resultados de esa prueba indican que es posible que tenga diabetes gestacional. Tambin se le podr realizar esta prueba si tiene algn sntoma o factor de riesgo de diabetes gestacional.  Hable con el mdico sobre lo que significan los [place]. Esta informacin no tiene Ambulance person el consejo del mdico. Asegrese de hacerle al mdico cualquier pregunta que tenga. Document Revised: 01/22/2017 Document Reviewed: 01/22/2017 Elsevier Patient Education  2020 [place] trimestre de 01/24/2017 Second Trimester  of Pregnancy  El segundo trimestre va desde la semana14 hasta la 27 (desde el mes 4 hasta el 6). Este suele ser el momento en el que mejor se siente. En general, las nuseas matutinas han disminuido o han desaparecido completamente. Tendr ms energa y podr aumentarle el apetito. El beb en gestacin se desarrolla rpidamente. Hacia el final del sexto mes, el beb mide aproximadamente 9 pulgadas (23 cm) y pesa alrededor de 1 libras (700 g). Es probable que sienta al beb 01/24/2017 18 y 20 semanas del [place]. Siga estas indicaciones en su casa: Medicamentos  [place] de venta libre y los recetados solamente como se lo haya indicado el mdico. Algunos medicamentos son seguros para tomar durante el Psychiatrist y otros no lo son.  Tome vitaminas prenatales que contengan por lo menos [employer] (?g) de cido flico.  Si tiene dificultad para mover el intestino (estreimiento), tome un medicamento para ablandar las heces (laxante) si su mdico se lo autoriza. Comida y bebida   Ingiera alimentos saludables de [place] regular.  No coma carne cruda ni quesos sin cocinar.  Si obtiene poca cantidad de calcio de los alimentos que ingiere, consulte a su mdico sobre la posibilidad de tomar un suplemento diario de calcio.  Evite el consumo de alimentos ricos en grasas y azcares, como los alimentos fritos y los dulces.  Si tiene [employer] (nuseas)  o devuelve (vomita): ? Ingiera 4 o 5comidas pequeas por Geophysical data processor de 3abundantes. ? Intente comer algunas galletitas saladas. ? Beba lquidos [employer], en lugar de [employer].  Para evitar el estreimiento: ? Consuma alimentos ricos en fibra, como frutas y verduras frescas, cereales integrales y frijoles. ? Beba suficiente lquido para mantener el pis (orina) claro o de color amarillo plido. Actividad  Haga ejercicios solamente como se lo haya indicado el mdico. Interrumpa la actividad  fsica si comienza a tener calambres.  No haga ejercicio si hace demasiado calor, hay demasiada humedad o se encuentra en un lugar de mucha altura (altitud alta).  Evite levantar pesos [employer].  Use zapatos con tacones bajos. Mantenga una buena postura al sentarse y pararse.  Puede continuar teniendo [employer] [place] [employer], a menos que el mdico le indique lo contrario. Alivio del dolor y del Dentist  Use un sostn que le brinde buen soporte si sus mamas estn sensibles.  Dese baos de asiento con agua tibia para Engineer, materials o las molestias causadas por las hemorroides. Use una crema para las hemorroides si el mdico la autoriza.  Descanse con las piernas elevadas si tiene calambres o dolor de cintura.  Si desarrolla venas hinchadas y abultadas (vrices) en las piernas: ? Use medias de compresin o medias de descanso como se lo haya indicado el mdico. ? Levante (eleve) los pies durante , 3 o 4veces por Futures trader. ? Limite el consumo de sal en sus alimentos. Cuidado prenatal  Tonga sus preguntas. Llvelas cuando concurra a las visitas prenatales.  Concurra a todas las visitas prenatales como se lo haya indicado el mdico. Esto es importante. Seguridad  [employer] cinturn de seguridad cuando conduzca.  Haga una lista de los nmeros de telfono de Associate Professor, que [employer] nmeros de telfono de familiares, amigos, [facility], as como los departamentos de polica y bomberos. Instrucciones generales  Consulte a su mdico sobre los [employer] debe comer o pdale que la ayude a Clinical research associate a quien pueda aconsejarla si necesita ese servicio.  Consulte a su mdico acerca de dnde se dictan clases prenatales cerca de donde vive. Comience las clases antes del mes 6 de embarazo.  No se d baos de inmersin en agua caliente, baos turcos ni saunas.  No se haga duchas vaginales ni use tampones o toallas higinicas perfumadas.  No mantenga las piernas cruzadas durante  [place].  Vaya al dentista si an no lo hizo. Use un cepillo de cerdas suaves para cepillarse los dientes. Psese el hilo dental suavemente.  No fume, no consuma hierbas ni beba alcohol. No tome frmacos que el mdico no haya autorizado.  No consuma ningn producto que contenga nicotina o tabaco, como cigarrillos y Administrator, Civil Service. Si necesita ayuda para dejar de fumar, consulte al mdico.  Evite el contacto con las bandejas sanitarias de los gatos y la tierra que estos animales usan. Estos elementos contienen bacterias que pueden causar defectos congnitos al beb y la posible prdida del beb (aborto espontneo) o la muerte fetal. Comunquese con un mdico si:  Tiene clicos leves o siente presin en la parte baja del vientre.  Tiene dolor al hacer pis (orinar).  Advierte un lquido con olor ftido que proviene de la vagina.  Tiene Programme researcher, broadcasting/film/video (nuseas), devuelve (vomita) o tiene deposiciones acuosas (diarrea).  Sufre un dolor persistente en el abdomen.  Siente mareos. Solicite ayuda de inmediato si:  Tiene fiebre.  Tiene una prdida de lquido por la  vagina.  Tiene sangrado o pequeas prdidas vaginales.  Siente dolor intenso o clicos en el abdomen.  Sube o baja de peso rpidamente.  Tiene dificultades para recuperar el aliento y siente dolor en el pecho.  Sbitamente se le hinchan mucho el rostro, las [place], los tobillos, los pies o las piernas.  No ha sentido los movimientos del beb durante Georgianne Fick.  Siente un dolor de cabeza intenso que no se alivia al tomar [employer].  Tiene dificultad para ver. Resumen  El segundo trimestre va desde la semana14 hasta la 27, desde el mes 4 hasta el 6. Este suele ser el momento en el que mejor se siente.  Para cuidarse y cuidar a su beb en gestacin, debe comer alimentos saludables, tomar medicamentos solamente si su mdico le indica que lo haga y hacer actividades que sean seguras para usted y su  beb.  Llame al mdico si se enferma o si nota algo inusual acerca de su embarazo. Tambin llame al mdico si necesita ayuda para saber qu alimentos debe comer o si quiere saber qu actividades puede realizar de forma segura. Esta informacin no tiene Theme park manager el consejo del mdico. Asegrese de hacerle al mdico cualquier pregunta que tenga. Document Revised: 12/19/2016 Document Reviewed: 12/19/2016 Elsevier Patient Education  2020 [employer].

## 2020-02-03 NOTE — Progress Notes (Signed)
   PRENATAL VISIT NOTE  Subjective:  [name] is a 39 y.o. [phone] at [redacted]w[redacted]d being seen today for ongoing prenatal care.  She is currently monitored for the following issues for this high-risk pregnancy and has History of stillbirth; Supervision of other normal pregnancy, antepartum; Language barrier; and [redacted] weeks gestation of pregnancy on their problem list.  Patient doing well with no acute concerns today. She reports no complaints.  Contractions: Not present. Vag. Bleeding: None.  Movement: Present. Denies leaking of fluid.   Dating has been corrected in the electronic chart, pt is 23 weeks at this visit.  The following portions of the patient's history were reviewed and updated as appropriate: allergies, current medications, past family history, past medical history, past social history, past surgical history and problem list. Problem list updated.  Objective:   Vitals:   02/03/20 0841  BP: 112/73  Pulse: 88  Weight: 154 lb (69.9 kg)    Fetal Status: Fetal Heart Rate (bpm): 154 Fundal Height: 23 cm Movement: Present     General:  Alert, oriented and cooperative. Patient is in no acute distress.  Skin: Skin is warm and dry. No rash noted.   Cardiovascular: Normal heart rate noted  Respiratory: Normal respiratory effort, no problems with respiration noted  Abdomen: Soft, gravid, appropriate for gestational age.  Pain/Pressure: Absent     Pelvic: Cervical exam deferred        Extremities: Normal range of motion.  Edema: None  Mental Status:  Normal mood and affect. Normal behavior. Normal judgment and thought content.   Assessment and Plan:  Pregnancy: [id number] at [redacted]w[redacted]d  1. History of stillbirth Discussed with pt, she states she is taking the baby aspirin as previously prescribed  2. Supervision of other normal pregnancy, antepartum Will get 2 hour GTT and third trimester labs next visit  3. Language barrier Interpreter present  4. [redacted] weeks gestation of  pregnancy   Preterm labor symptoms and general obstetric precautions including but not limited to vaginal bleeding, contractions, leaking of fluid and fetal movement were reviewed in detail with the patient.  Please refer to After Visit Summary for other counseling recommendations.   Return in about 4 weeks (around 03/02/2020) for [facility], in person, 2 hr GTT, 3rd trim labs.   Mariel Aloe, MD

## 2020-02-09 ENCOUNTER — Ambulatory Visit: Payer: Self-pay | Attending: Obstetrics and Gynecology

## 2020-02-09 ENCOUNTER — Encounter: Payer: Self-pay | Admitting: *Deleted

## 2020-02-09 ENCOUNTER — Ambulatory Visit: Payer: Self-pay | Admitting: *Deleted

## 2020-02-09 ENCOUNTER — Other Ambulatory Visit: Payer: Self-pay

## 2020-02-09 VITALS — BP 110/67 | HR 81

## 2020-02-09 DIAGNOSIS — O09522 Supervision of elderly multigravida, second trimester: Secondary | ICD-10-CM | POA: Insufficient documentation

## 2020-02-09 DIAGNOSIS — O322XX0 Maternal care for transverse and oblique lie, not applicable or unspecified: Secondary | ICD-10-CM

## 2020-02-09 DIAGNOSIS — Z8759 Personal history of other complications of pregnancy, childbirth and the puerperium: Secondary | ICD-10-CM | POA: Insufficient documentation

## 2020-02-09 DIAGNOSIS — Z362 Encounter for other antenatal screening follow-up: Secondary | ICD-10-CM

## 2020-02-09 DIAGNOSIS — O09212 Supervision of pregnancy with history of pre-term labor, second trimester: Secondary | ICD-10-CM

## 2020-02-09 DIAGNOSIS — O0942 Supervision of pregnancy with grand multiparity, second trimester: Secondary | ICD-10-CM

## 2020-02-09 DIAGNOSIS — O09292 Supervision of pregnancy with other poor reproductive or obstetric history, second trimester: Secondary | ICD-10-CM

## 2020-02-10 ENCOUNTER — Other Ambulatory Visit: Payer: Self-pay | Admitting: *Deleted

## 2020-02-10 DIAGNOSIS — Z8759 Personal history of other complications of pregnancy, childbirth and the puerperium: Secondary | ICD-10-CM

## 2020-03-02 ENCOUNTER — Other Ambulatory Visit: Payer: Self-pay

## 2020-03-02 ENCOUNTER — Encounter: Payer: Self-pay | Admitting: Obstetrics and Gynecology

## 2020-03-02 ENCOUNTER — Other Ambulatory Visit (INDEPENDENT_AMBULATORY_CARE_PROVIDER_SITE_OTHER): Payer: Self-pay

## 2020-03-02 ENCOUNTER — Ambulatory Visit (INDEPENDENT_AMBULATORY_CARE_PROVIDER_SITE_OTHER): Payer: Self-pay | Admitting: Obstetrics and Gynecology

## 2020-03-02 DIAGNOSIS — Z8759 Personal history of other complications of pregnancy, childbirth and the puerperium: Secondary | ICD-10-CM

## 2020-03-02 DIAGNOSIS — Z789 Other specified health status: Secondary | ICD-10-CM

## 2020-03-02 DIAGNOSIS — Z3480 Encounter for supervision of other normal pregnancy, unspecified trimester: Secondary | ICD-10-CM

## 2020-03-02 NOTE — Patient Instructions (Signed)
Tercer trimestre de embarazo Third Trimester of Pregnancy  El tercer trimestre comprende desde la semana28 hasta la semana40 (desde el mes7 hasta el mes9). En este trimestre, el beb en gestacin (feto) crece muy rpidamente. Hacia el final del noveno mes, el beb en gestacin mide alrededor de 20pulgadas (45cm) de largo. Pesa entre 6y 10libras (2,70y 4,50kg). Siga estas indicaciones en su casa: Medicamentos  Tome los medicamentos de venta libre y los recetados solamente como se lo haya indicado el mdico. Algunos medicamentos son seguros para tomar durante el embarazo y otros no lo son.  Tome vitaminas prenatales que contengan por lo menos 600microgramos (?g) de cido flico.  Si tiene dificultad para mover el intestino (estreimiento), tome un medicamento para ablandar las heces (laxante) si su mdico se lo autoriza. Comida y bebida   Ingiera alimentos saludables de manera regular.  No coma carne cruda ni quesos sin cocinar.  Si obtiene poca cantidad de calcio de los alimentos que ingiere, consulte a su mdico sobre la posibilidad de tomar un suplemento diario de calcio.  La ingesta diaria de cuatro o cinco comidas pequeas en lugar de tres comidas abundantes.  Evite el consumo de alimentos ricos en grasas y azcares, como los alimentos fritos y los dulces.  Para evitar el estreimiento: ? Consuma alimentos ricos en fibra, como frutas y verduras frescas, cereales integrales y frijoles. ? Beba suficiente lquido para mantener el pis (orina) claro o de color amarillo plido. Actividad  Haga ejercicios solamente como se lo haya indicado el mdico. Interrumpa la actividad fsica si comienza a tener calambres.  No levante objetos pesados, use zapatos de tacones bajos y sintese derecha.  No haga ejercicio si hace demasiado calor, hay demasiada humedad o se encuentra en un lugar de mucha altura (altitud alta).  Puede continuar teniendo relaciones sexuales, a menos que el  mdico le indique lo contrario. Alivio del dolor y del malestar  Use un sostn que le brinde buen soporte si sus mamas estn sensibles.  Haga pausas frecuentes y descanse con las piernas levantadas si tiene calambres en las piernas o dolor en la zona lumbar.  Dese baos de asiento con agua tibia para aliviar el dolor o las molestias causadas por las hemorroides. Use una crema para las hemorroides si el mdico la autoriza.  Si desarrolla venas hinchadas y abultadas (vrices) en las piernas: ? Use medias de compresin o medias de descanso como se lo haya indicado el mdico. ? Levante (eleve) los pies durante 15minutos, 3 o 4veces por da. ? Limite el consumo de sal en sus alimentos. Seguridad  Colquese el cinturn de seguridad cuando conduzca.  Haga una lista de los nmeros de telfono de emergencia, que incluya los nmeros de telfono de familiares, amigos, el hospital, as como los departamentos de polica y bomberos. Preparacin para la llegada del beb Para prepararse para la llegada de su beb:  Tome clases prenatales.  Practique ir manejando al hospital.  Visite el hospital y recorra el rea de maternidad.  Hable en su trabajo acerca de tomar licencia cuando llegue el beb.  Prepare el bolso que llevar al hospital.  Prepare la habitacin del beb.  Concurra a los controles mdicos.  Compre un asiento de seguridad orientado hacia atrs para llevar al beb en el automvil. Aprenda cmo instalarlo en el auto. Instrucciones generales  No se d baos de inmersin en agua caliente, baos turcos ni saunas.  No consuma ningn producto que contenga nicotina o tabaco, como cigarrillos y cigarrillos   electrnicos. Si necesita ayuda para dejar de fumar, consulte al mdico.  No beba alcohol.  No se haga duchas vaginales ni use tampones o toallas higinicas perfumadas.  No mantenga las piernas cruzadas durante mucho tiempo.  No haga viajes de larga distancia, excepto si es  obligatorio. Hgalos solamente si su mdico la autoriza.  Visite a su dentista si no lo ha hecho durante el embarazo. Use un cepillo de cerdas suaves para cepillarse los dientes. Psese el hilo dental con suavidad.  Evite el contacto con las bandejas sanitarias de los gatos y la tierra que estos animales usan. Estos elementos contienen bacterias que pueden causar defectos congnitos al beb y la posible prdida del beb (aborto espontneo) o la muerte fetal.  Concurra a todas las visitas prenatales como se lo haya indicado el mdico. Esto es importante. Comunquese con un mdico si:  No est segura de si est en trabajo de parto o si ha roto la bolsa de las aguas.  Tiene mareos.  Tiene clicos leves o siente presin en la parte baja del vientre.  Sufre un dolor persistente en el abdomen.  Sigue teniendo malestar estomacal, vomita o tiene heces lquidas.  Advierte un lquido con olor ftido que proviene de la vagina.  Siente dolor al orinar. Solicite ayuda de inmediato si:  Tiene fiebre.  Tiene una prdida de lquido por la vagina.  Tiene sangrado o pequeas prdidas vaginales.  Siente dolor intenso o clicos en el abdomen.  Aumenta o baja de peso rpidamente.  Tiene dificultades para recuperar el aliento y siente dolor en el pecho.  Sbitamente se le hinchan mucho el rostro, las manos, los tobillos, los pies o las piernas.  No ha sentido los movimientos del beb durante una hora.  Siente un dolor de cabeza intenso que no se alivia con medicamentos.  Tiene dificultad para ver.  Tiene prdida de lquido o le sale un chorro de lquido de la vagina antes de estar en la semana 37.  Tiene espasmos abdominales (contracciones) regulares antes de estar en la semana 37. Resumen  El tercer trimestre comprende desde la semana28 hasta la semana40 (desde el mes7 hasta el mes9). Esta es la poca en que el beb en gestacin crece muy rpidamente.  Siga los consejos del mdico  con respecto a los medicamentos, la alimentacin y la actividad.  Preprese para la llegada del beb tomando las clases prenatales, preparando todo lo que necesitar el beb, arreglando la habitacin del beb y concurriendo a los controles mdicos.  Solicite ayuda de inmediato si tiene sangrado por la vagina, siente dolor en el pecho o tiene dificultad para respirar, o si no ha sentido que su beb se mueve en el transcurso de ms de una hora. Esta informacin no tiene como fin reemplazar el consejo del mdico. Asegrese de hacerle al mdico cualquier pregunta que tenga. Document Revised: 10/29/2016 Document Reviewed: 10/29/2016 Elsevier Patient Education  2020 Elsevier Inc.  

## 2020-03-02 NOTE — Progress Notes (Signed)
Subjective:  Raniyah Curenton is a 39 y.o. [phone] at [redacted]w[redacted]d being seen today for ongoing prenatal care.  She is currently monitored for the following issues for this high-risk pregnancy and has History of stillbirth; Supervision of other normal pregnancy, antepartum; Language barrier; and [redacted] weeks gestation of pregnancy on their problem list.  Patient reports no complaints.  Contractions: Not present. Vag. Bleeding: None.  Movement: Present. Denies leaking of fluid.   The following portions of the patient's history were reviewed and updated as appropriate: allergies, current medications, past family history, past medical history, past social history, past surgical history and problem list. Problem list updated.  Objective:   Vitals:   03/02/20 0833  BP: 107/70  Pulse: 85  Weight: 158 lb 6.4 oz (71.8 kg)    Fetal Status: Fetal Heart Rate (bpm): 140   Movement: Present     General:  Alert, oriented and cooperative. Patient is in no acute distress.  Skin: Skin is warm and dry. No rash noted.   Cardiovascular: Normal heart rate noted  Respiratory: Normal respiratory effort, no problems with respiration noted  Abdomen: Soft, gravid, appropriate for gestational age. Pain/Pressure: Present     Pelvic:  Cervical exam deferred        Extremities: Normal range of motion.  Edema: None  Mental Status: Normal mood and affect. Normal behavior. Normal judgment and thought content.   Urinalysis:      Assessment and Plan:  Pregnancy: [id number] at [redacted]w[redacted]d  1. Supervision of other normal pregnancy, antepartum Stable Glucola today  2. History of stillbirth Continue with BASA F/U U/S next month  3. Language barrier Live interrupter used during today's visit  Preterm labor symptoms and general obstetric precautions including but not limited to vaginal bleeding, contractions, leaking of fluid and fetal movement were reviewed in detail with the patient. Please refer to After Visit Summary for  other counseling recommendations.  Return in about 3 weeks (around 03/23/2020) for OB visit, face to face, MD only.   Hermina Staggers, MD

## 2020-03-02 NOTE — Progress Notes (Signed)
Patient presents for ROB and GTT.  

## 2020-03-03 LAB — CBC
Hematocrit: 33.7 % — ABNORMAL LOW (ref 34.0–46.6)
Hemoglobin: 11.4 g/dL (ref 11.1–15.9)
MCH: 28.4 pg (ref 26.6–33.0)
MCHC: 33.8 g/dL (ref 31.5–35.7)
MCV: 84 fL (ref 79–97)
Platelets: 325 10*3/uL (ref 150–450)
RBC: 4.01 x10E6/uL (ref 3.77–5.28)
RDW: 12.8 % (ref 11.7–15.4)
WBC: 10.7 10*3/uL (ref 3.4–10.8)

## 2020-03-03 LAB — GLUCOSE TOLERANCE, 2 HOURS W/ 1HR
Glucose, 1 hour: 207 mg/dL — ABNORMAL HIGH (ref 65–179)
Glucose, 2 hour: 193 mg/dL — ABNORMAL HIGH (ref 65–152)
Glucose, Fasting: 87 mg/dL (ref 65–91)

## 2020-03-03 LAB — HIV ANTIBODY (ROUTINE TESTING W REFLEX): HIV Screen 4th Generation wRfx: NONREACTIVE

## 2020-03-03 LAB — RPR: RPR Ser Ql: NONREACTIVE

## 2020-03-07 ENCOUNTER — Encounter: Payer: Self-pay | Admitting: Obstetrics and Gynecology

## 2020-03-07 DIAGNOSIS — O24419 Gestational diabetes mellitus in pregnancy, unspecified control: Secondary | ICD-10-CM | POA: Insufficient documentation

## 2020-03-08 ENCOUNTER — Telehealth: Payer: Self-pay

## 2020-03-08 DIAGNOSIS — O24419 Gestational diabetes mellitus in pregnancy, unspecified control: Secondary | ICD-10-CM

## 2020-03-08 NOTE — Telephone Encounter (Signed)
Patient will need DM testing and Education referral has been placed.

## 2020-03-10 ENCOUNTER — Other Ambulatory Visit: Payer: Self-pay

## 2020-03-10 ENCOUNTER — Encounter: Payer: Self-pay | Attending: Obstetrics and Gynecology | Admitting: Registered"

## 2020-03-10 ENCOUNTER — Ambulatory Visit: Payer: Self-pay | Admitting: Registered"

## 2020-03-10 DIAGNOSIS — Z3A00 Weeks of gestation of pregnancy not specified: Secondary | ICD-10-CM | POA: Insufficient documentation

## 2020-03-10 DIAGNOSIS — O24419 Gestational diabetes mellitus in pregnancy, unspecified control: Secondary | ICD-10-CM | POA: Insufficient documentation

## 2020-03-10 NOTE — Progress Notes (Signed)
Interpreter services provided by Michelene Heady [phone] from [employer] video   Patient was seen on 03/10/20 for Gestational Diabetes self-management. EDD 06/01/20, [redacted]w[redacted]d Patient states no history of GDM. Diet history obtained. Patient eats variety of all food groups. Beverages include water, sweet coffee.  Patient reports no structured physical activity.  The following learning objectives were met by the patient :   States the definition of Gestational Diabetes (will be covered in follow-up appt)  States why dietary management is important in controlling blood glucose  Describes the effects of carbohydrates on blood glucose levels  Demonstrates ability to create a balanced meal plan (will be covered in follow-up appt)  Demonstrates carbohydrate counting (will be covered in follow-up appt)  States when to check blood glucose levels  Demonstrates proper blood glucose monitoring techniques  States the effect of stress and exercise on blood glucose levels (will be covered in follow-up appt)  States the importance of limiting caffeine and abstaining from alcohol and smoking (will be covered in follow-up appt)  Plan:  To help bring down fasting blood sugar, try increasing activity in the evening, walking is okay. Begin checking Blood Glucose before breakfast and 2 hours after first bite of breakfast, lunch and dinner as directed by MD  Bring Log Book/Sheet and meter to every medical appointment  Baby Scripts: (BS 2.0 not capable of glucose management at this time.) Patient to record blood sugar on glucose log sheet  Take medication if directed by MD  Blood glucose monitor given: [facility] CBG: 108 mg/dL (fasting)  Patient instructed to monitor glucose levels: FBS: 60 - 95 mg/dl 2 hour: <120 mg/dl  Patient received the following handouts:  Nutrition Diabetes and Pregnancy  Carbohydrate Counting List  Blood glucose Log Sheet  Patient will be seen for follow-up 1 in weeks or as needed.

## 2020-03-15 ENCOUNTER — Other Ambulatory Visit: Payer: Self-pay

## 2020-03-15 ENCOUNTER — Encounter: Payer: Self-pay | Admitting: *Deleted

## 2020-03-15 ENCOUNTER — Ambulatory Visit: Payer: Self-pay | Attending: Obstetrics and Gynecology

## 2020-03-15 ENCOUNTER — Other Ambulatory Visit: Payer: Self-pay | Admitting: *Deleted

## 2020-03-15 ENCOUNTER — Ambulatory Visit: Payer: Self-pay | Admitting: *Deleted

## 2020-03-15 VITALS — BP 106/60 | HR 83

## 2020-03-15 DIAGNOSIS — O09523 Supervision of elderly multigravida, third trimester: Secondary | ICD-10-CM

## 2020-03-15 DIAGNOSIS — O09293 Supervision of pregnancy with other poor reproductive or obstetric history, third trimester: Secondary | ICD-10-CM

## 2020-03-15 DIAGNOSIS — O0943 Supervision of pregnancy with grand multiparity, third trimester: Secondary | ICD-10-CM

## 2020-03-15 DIAGNOSIS — Z8759 Personal history of other complications of pregnancy, childbirth and the puerperium: Secondary | ICD-10-CM | POA: Insufficient documentation

## 2020-03-15 DIAGNOSIS — Z362 Encounter for other antenatal screening follow-up: Secondary | ICD-10-CM

## 2020-03-15 DIAGNOSIS — Z3A28 28 weeks gestation of pregnancy: Secondary | ICD-10-CM

## 2020-03-15 DIAGNOSIS — O09213 Supervision of pregnancy with history of pre-term labor, third trimester: Secondary | ICD-10-CM

## 2020-03-23 ENCOUNTER — Other Ambulatory Visit: Payer: Self-pay

## 2020-03-23 ENCOUNTER — Ambulatory Visit (INDEPENDENT_AMBULATORY_CARE_PROVIDER_SITE_OTHER): Payer: Self-pay | Admitting: Obstetrics & Gynecology

## 2020-03-23 VITALS — BP 107/67 | HR 87 | Wt 159.0 lb

## 2020-03-23 DIAGNOSIS — Z3480 Encounter for supervision of other normal pregnancy, unspecified trimester: Secondary | ICD-10-CM

## 2020-03-23 DIAGNOSIS — Z8759 Personal history of other complications of pregnancy, childbirth and the puerperium: Secondary | ICD-10-CM

## 2020-03-23 DIAGNOSIS — O24419 Gestational diabetes mellitus in pregnancy, unspecified control: Secondary | ICD-10-CM

## 2020-03-23 NOTE — Progress Notes (Signed)
Pt states she is having bilateral leg cramps, mostly at night.  Pt is scheduled for Diabetic class this Friday.

## 2020-03-23 NOTE — Progress Notes (Signed)
   PRENATAL VISIT NOTE  Subjective:  [name] is a 39 y.o. [phone] at [redacted]w[redacted]d being seen today for ongoing prenatal care.  She is currently monitored for the following issues for this high-risk pregnancy and has History of stillbirth; Supervision of other normal pregnancy, antepartum; Language barrier; and Gestational diabetes on their problem list.  Patient reports leg cramps occasionally at night.  Contractions: Not present. Vag. Bleeding: None.  Movement: Present. Denies leaking of fluid.   The following portions of the patient's history were reviewed and updated as appropriate: allergies, current medications, past family history, past medical history, past social history, past surgical history and problem list.   Objective:   Vitals:   03/23/20 0919  BP: 107/67  Pulse: 87  Weight: 159 lb (72.1 kg)    Fetal Status: Fetal Heart Rate (bpm): 150   Movement: Present     General:  Alert, oriented and cooperative. Patient is in no acute distress.  Skin: Skin is warm and dry. No rash noted.   Cardiovascular: Normal heart rate noted  Respiratory: Normal respiratory effort, no problems with respiration noted  Abdomen: Soft, gravid, appropriate for gestational age.  Pain/Pressure: Present     Pelvic: Cervical exam deferred        Extremities: Normal range of motion.     Mental Status: Normal mood and affect. Normal behavior. Normal judgment and thought content.   Assessment and Plan:  Pregnancy: [id number] at [redacted]w[redacted]d 1. History of stillbirth Start antenatal testing 32 weeks  2. Gestational diabetes mellitus (GDM), antepartum, gestational diabetes method of control unspecified DM  Education this week. FBS 50% in range now and PP 60%, likely will need medication but will see if diet helps after counseling  3. Supervision of other normal pregnancy, antepartum [place] result reviewed  Preterm labor symptoms and general obstetric precautions including but not limited to vaginal bleeding,  contractions, leaking of fluid and fetal movement were reviewed in detail with the patient. Please refer to After Visit Summary for other counseling recommendations.   Return in about 2 weeks (around 04/06/2020).  Future Appointments  Date Time Provider Department Center  03/25/2020  8:15 AM [facility]  04/05/2020  7:15 AM [facility]URSE [facility]  04/05/2020  7:30 AM [facility] US3 [facility]  04/07/2020  9:15 AM Adam Phenix, MD [facility] None  04/13/2020  7:45 AM [facility]URSE [facility]  04/13/2020  8:00 AM [facility] US1 [facility]  04/20/2020  8:30 AM [facility]URSE [facility]  04/20/2020  8:45 AM [facility] US4 [facility]    Scheryl Darter, MD

## 2020-03-23 NOTE — Patient Instructions (Signed)
Tercer trimestre de embarazo Third Trimester of Pregnancy  El tercer trimestre comprende desde la semana28 hasta la semana40 (desde el mes7 hasta el mes9). En este trimestre, el beb en gestacin (feto) crece muy rpidamente. Hacia el final del noveno mes, el beb en gestacin mide alrededor de 20pulgadas (45cm) de largo. Pesa entre 6y 10libras (2,70y 4,50kg). Siga estas indicaciones en su casa: Medicamentos  Tome los medicamentos de venta libre y los recetados solamente como se lo haya indicado el mdico. Algunos medicamentos son seguros para tomar durante el embarazo y otros no lo son.  Tome vitaminas prenatales que contengan por lo menos 600microgramos (?g) de cido flico.  Si tiene dificultad para mover el intestino (estreimiento), tome un medicamento para ablandar las heces (laxante) si su mdico se lo autoriza. Comida y bebida   Ingiera alimentos saludables de manera regular.  No coma carne cruda ni quesos sin cocinar.  Si obtiene poca cantidad de calcio de los alimentos que ingiere, consulte a su mdico sobre la posibilidad de tomar un suplemento diario de calcio.  La ingesta diaria de cuatro o cinco comidas pequeas en lugar de tres comidas abundantes.  Evite el consumo de alimentos ricos en grasas y azcares, como los alimentos fritos y los dulces.  Para evitar el estreimiento: ? Consuma alimentos ricos en fibra, como frutas y verduras frescas, cereales integrales y frijoles. ? Beba suficiente lquido para mantener el pis (orina) claro o de color amarillo plido. Actividad  Haga ejercicios solamente como se lo haya indicado el mdico. Interrumpa la actividad fsica si comienza a tener calambres.  No levante objetos pesados, use zapatos de tacones bajos y sintese derecha.  No haga ejercicio si hace demasiado calor, hay demasiada humedad o se encuentra en un lugar de mucha altura (altitud alta).  Puede continuar teniendo relaciones sexuales, a menos que el  mdico le indique lo contrario. Alivio del dolor y del malestar  Use un sostn que le brinde buen soporte si sus mamas estn sensibles.  Haga pausas frecuentes y descanse con las piernas levantadas si tiene calambres en las piernas o dolor en la zona lumbar.  Dese baos de asiento con agua tibia para aliviar el dolor o las molestias causadas por las hemorroides. Use una crema para las hemorroides si el mdico la autoriza.  Si desarrolla venas hinchadas y abultadas (vrices) en las piernas: ? Use medias de compresin o medias de descanso como se lo haya indicado el mdico. ? Levante (eleve) los pies durante 15minutos, 3 o 4veces por da. ? Limite el consumo de sal en sus alimentos. Seguridad  Colquese el cinturn de seguridad cuando conduzca.  Haga una lista de los nmeros de telfono de emergencia, que incluya los nmeros de telfono de familiares, amigos, el hospital, as como los departamentos de polica y bomberos. Preparacin para la llegada del beb Para prepararse para la llegada de su beb:  Tome clases prenatales.  Practique ir manejando al hospital.  Visite el hospital y recorra el rea de maternidad.  Hable en su trabajo acerca de tomar licencia cuando llegue el beb.  Prepare el bolso que llevar al hospital.  Prepare la habitacin del beb.  Concurra a los controles mdicos.  Compre un asiento de seguridad orientado hacia atrs para llevar al beb en el automvil. Aprenda cmo instalarlo en el auto. Instrucciones generales  No se d baos de inmersin en agua caliente, baos turcos ni saunas.  No consuma ningn producto que contenga nicotina o tabaco, como cigarrillos y cigarrillos   electrnicos. Si necesita ayuda para dejar de fumar, consulte al mdico.  No beba alcohol.  No se haga duchas vaginales ni use tampones o toallas higinicas perfumadas.  No mantenga las piernas cruzadas durante mucho tiempo.  No haga viajes de larga distancia, excepto si es  obligatorio. Hgalos solamente si su mdico la autoriza.  Visite a su dentista si no lo ha hecho durante el embarazo. Use un cepillo de cerdas suaves para cepillarse los dientes. Psese el hilo dental con suavidad.  Evite el contacto con las bandejas sanitarias de los gatos y la tierra que estos animales usan. Estos elementos contienen bacterias que pueden causar defectos congnitos al beb y la posible prdida del beb (aborto espontneo) o la muerte fetal.  Concurra a todas las visitas prenatales como se lo haya indicado el mdico. Esto es importante. Comunquese con un mdico si:  No est segura de si est en trabajo de parto o si ha roto la bolsa de las aguas.  Tiene mareos.  Tiene clicos leves o siente presin en la parte baja del vientre.  Sufre un dolor persistente en el abdomen.  Sigue teniendo malestar estomacal, vomita o tiene heces lquidas.  Advierte un lquido con olor ftido que proviene de la vagina.  Siente dolor al orinar. Solicite ayuda de inmediato si:  Tiene fiebre.  Tiene una prdida de lquido por la vagina.  Tiene sangrado o pequeas prdidas vaginales.  Siente dolor intenso o clicos en el abdomen.  Aumenta o baja de peso rpidamente.  Tiene dificultades para recuperar el aliento y siente dolor en el pecho.  Sbitamente se le hinchan mucho el rostro, las manos, los tobillos, los pies o las piernas.  No ha sentido los movimientos del beb durante una hora.  Siente un dolor de cabeza intenso que no se alivia con medicamentos.  Tiene dificultad para ver.  Tiene prdida de lquido o le sale un chorro de lquido de la vagina antes de estar en la semana 37.  Tiene espasmos abdominales (contracciones) regulares antes de estar en la semana 37. Resumen  El tercer trimestre comprende desde la semana28 hasta la semana40 (desde el mes7 hasta el mes9). Esta es la poca en que el beb en gestacin crece muy rpidamente.  Siga los consejos del mdico  con respecto a los medicamentos, la alimentacin y la actividad.  Preprese para la llegada del beb tomando las clases prenatales, preparando todo lo que necesitar el beb, arreglando la habitacin del beb y concurriendo a los controles mdicos.  Solicite ayuda de inmediato si tiene sangrado por la vagina, siente dolor en el pecho o tiene dificultad para respirar, o si no ha sentido que su beb se mueve en el transcurso de ms de una hora. Esta informacin no tiene como fin reemplazar el consejo del mdico. Asegrese de hacerle al mdico cualquier pregunta que tenga. Document Revised: 10/29/2016 Document Reviewed: 10/29/2016 Elsevier Patient Education  2020 Elsevier Inc.  

## 2020-03-25 ENCOUNTER — Telehealth: Payer: Self-pay | Admitting: Registered"

## 2020-03-25 ENCOUNTER — Other Ambulatory Visit: Payer: Self-pay

## 2020-03-25 NOTE — Telephone Encounter (Signed)
Call placed using [facility] Gean Maidens #[id number]  RD called patient after she arrived for nutrition appointment on Fri 03/25/20. There was a mixup in scheduling, RD is not in the office on Fridays. RD appologized and offered the next available day of Dec 22 and patient accepted this appointment day and asked for 9:15 slot.

## 2020-03-29 ENCOUNTER — Other Ambulatory Visit: Payer: Self-pay

## 2020-03-30 ENCOUNTER — Other Ambulatory Visit: Payer: Self-pay

## 2020-04-05 ENCOUNTER — Other Ambulatory Visit: Payer: Self-pay

## 2020-04-05 ENCOUNTER — Ambulatory Visit: Payer: Self-pay | Admitting: *Deleted

## 2020-04-05 ENCOUNTER — Ambulatory Visit: Payer: Self-pay | Attending: Obstetrics and Gynecology

## 2020-04-05 ENCOUNTER — Encounter: Payer: Self-pay | Admitting: *Deleted

## 2020-04-05 VITALS — BP 111/56 | HR 77

## 2020-04-05 DIAGNOSIS — O09523 Supervision of elderly multigravida, third trimester: Secondary | ICD-10-CM | POA: Insufficient documentation

## 2020-04-05 DIAGNOSIS — Z8759 Personal history of other complications of pregnancy, childbirth and the puerperium: Secondary | ICD-10-CM | POA: Insufficient documentation

## 2020-04-05 DIAGNOSIS — Z3A31 31 weeks gestation of pregnancy: Secondary | ICD-10-CM

## 2020-04-05 DIAGNOSIS — O09293 Supervision of pregnancy with other poor reproductive or obstetric history, third trimester: Secondary | ICD-10-CM

## 2020-04-05 DIAGNOSIS — O09213 Supervision of pregnancy with history of pre-term labor, third trimester: Secondary | ICD-10-CM

## 2020-04-07 ENCOUNTER — Other Ambulatory Visit: Payer: Self-pay

## 2020-04-07 ENCOUNTER — Ambulatory Visit (INDEPENDENT_AMBULATORY_CARE_PROVIDER_SITE_OTHER): Payer: Self-pay | Admitting: Obstetrics & Gynecology

## 2020-04-07 ENCOUNTER — Encounter: Payer: Self-pay | Admitting: Obstetrics & Gynecology

## 2020-04-07 VITALS — BP 106/70 | HR 89 | Wt 162.8 lb

## 2020-04-07 DIAGNOSIS — O24419 Gestational diabetes mellitus in pregnancy, unspecified control: Secondary | ICD-10-CM

## 2020-04-07 DIAGNOSIS — Z3480 Encounter for supervision of other normal pregnancy, unspecified trimester: Secondary | ICD-10-CM

## 2020-04-07 DIAGNOSIS — Z8759 Personal history of other complications of pregnancy, childbirth and the puerperium: Secondary | ICD-10-CM

## 2020-04-07 MED ORDER — METFORMIN HCL 500 MG PO TABS: 500.0000 mg | ORAL_TABLET | Freq: Two times a day (BID) | ORAL | 1 refills | Status: DC

## 2020-04-07 NOTE — Progress Notes (Signed)
Pt presents for ROB CBG readings are available  Used Spanish interpreter [phone] 10/10 BPP on Tuesday

## 2020-04-07 NOTE — Progress Notes (Signed)
   PRENATAL VISIT NOTE  Subjective:  [name] is a 39 y.o. [phone] at [redacted]w[redacted]d being seen today for ongoing prenatal care.  She is currently monitored for the following issues for this high-risk pregnancy and has History of stillbirth; Supervision of other normal pregnancy, antepartum; Language barrier; and Gestational diabetes on their problem list.  Patient reports no complaints.  Contractions: Not present. Vag. Bleeding: None.  Movement: Present. Denies leaking of fluid.   The following portions of the patient's history were reviewed and updated as appropriate: allergies, current medications, past family history, past medical history, past social history, past surgical history and problem list.   Objective:   Vitals:   04/07/20 0920  BP: 106/70  Pulse: 89  Weight: 162 lb 12.8 oz (73.8 kg)    Fetal Status: Fetal Heart Rate (bpm): 140   Movement: Present     General:  Alert, oriented and cooperative. Patient is in no acute distress.  Skin: Skin is warm and dry. No rash noted.   Cardiovascular: Normal heart rate noted  Respiratory: Normal respiratory effort, no problems with respiration noted  Abdomen: Soft, gravid, appropriate for gestational age.  Pain/Pressure: Absent     Pelvic: Cervical exam deferred        Extremities: Normal range of motion.     Mental Status: Normal mood and affect. Normal behavior. Normal judgment and thought content.   Assessment and Plan:  Pregnancy: [id number] at [redacted]w[redacted]d 1. Supervision of other normal pregnancy, antepartum FBS >75% >95, will add medication - metFORMIN (GLUCOPHAGE) 500 MG tablet; Take 1 tablet (500 mg total) by mouth 2 (two) times daily with a meal.  Dispense: 30 tablet; Refill: 1  2. Gestational diabetes mellitus (GDM), antepartum, gestational diabetes method of control unspecified  - metFORMIN (GLUCOPHAGE) 500 MG tablet; Take 1 tablet (500 mg total) by mouth 2 (two) times daily with a meal.  Dispense: 30 tablet; Refill: 1  3.  History of stillbirth Weekly BPP, serial growth [place] - metFORMIN (GLUCOPHAGE) 500 MG tablet; Take 1 tablet (500 mg total) by mouth 2 (two) times daily with a meal.  Dispense: 30 tablet; Refill: 1  Preterm labor symptoms and general obstetric precautions including but not limited to vaginal bleeding, contractions, leaking of fluid and fetal movement were reviewed in detail with the patient. Please refer to After Visit Summary for other counseling recommendations.   Return in about 1 week (around 04/14/2020).  Future Appointments  Date Time Provider Department Center  04/12/2020  8:15 AM [facility]  04/13/2020  7:45 AM [facility] NURSE [facility]  04/13/2020  8:00 AM [facility] US1 [facility]  04/14/2020  8:35 AM Rasch, Harolyn Rutherford, NP [facility] None  04/20/2020  8:30 AM [facility] NURSE [facility]  04/20/2020  8:45 AM [facility] US4 [facility]    Scheryl Darter, MD

## 2020-04-07 NOTE — Patient Instructions (Signed)
Tercer trimestre de embarazo Third Trimester of Pregnancy  El tercer trimestre comprende desde la semana28 hasta la semana40 (desde el mes7 hasta el mes9). En este trimestre, el beb en gestacin (feto) crece muy rpidamente. Hacia el final del noveno mes, el beb en gestacin mide alrededor de 20pulgadas (45cm) de largo. Pesa entre 6y 10libras (2,70y 4,50kg). Siga estas indicaciones en su casa: Medicamentos  Tome los medicamentos de venta libre y los recetados solamente como se lo haya indicado el mdico. Algunos medicamentos son seguros para tomar durante el embarazo y otros no lo son.  Tome vitaminas prenatales que contengan por lo menos 600microgramos (?g) de cido flico.  Si tiene dificultad para mover el intestino (estreimiento), tome un medicamento para ablandar las heces (laxante) si su mdico se lo autoriza. Comida y bebida   Ingiera alimentos saludables de manera regular.  No coma carne cruda ni quesos sin cocinar.  Si obtiene poca cantidad de calcio de los alimentos que ingiere, consulte a su mdico sobre la posibilidad de tomar un suplemento diario de calcio.  La ingesta diaria de cuatro o cinco comidas pequeas en lugar de tres comidas abundantes.  Evite el consumo de alimentos ricos en grasas y azcares, como los alimentos fritos y los dulces.  Para evitar el estreimiento: ? Consuma alimentos ricos en fibra, como frutas y verduras frescas, cereales integrales y frijoles. ? Beba suficiente lquido para mantener el pis (orina) claro o de color amarillo plido. Actividad  Haga ejercicios solamente como se lo haya indicado el mdico. Interrumpa la actividad fsica si comienza a tener calambres.  No levante objetos pesados, use zapatos de tacones bajos y sintese derecha.  No haga ejercicio si hace demasiado calor, hay demasiada humedad o se encuentra en un lugar de mucha altura (altitud alta).  Puede continuar teniendo relaciones sexuales, a menos que el  mdico le indique lo contrario. Alivio del dolor y del malestar  Use un sostn que le brinde buen soporte si sus mamas estn sensibles.  Haga pausas frecuentes y descanse con las piernas levantadas si tiene calambres en las piernas o dolor en la zona lumbar.  Dese baos de asiento con agua tibia para aliviar el dolor o las molestias causadas por las hemorroides. Use una crema para las hemorroides si el mdico la autoriza.  Si desarrolla venas hinchadas y abultadas (vrices) en las piernas: ? Use medias de compresin o medias de descanso como se lo haya indicado el mdico. ? Levante (eleve) los pies durante 15minutos, 3 o 4veces por da. ? Limite el consumo de sal en sus alimentos. Seguridad  Colquese el cinturn de seguridad cuando conduzca.  Haga una lista de los nmeros de telfono de emergencia, que incluya los nmeros de telfono de familiares, amigos, el hospital, as como los departamentos de polica y bomberos. Preparacin para la llegada del beb Para prepararse para la llegada de su beb:  Tome clases prenatales.  Practique ir manejando al hospital.  Visite el hospital y recorra el rea de maternidad.  Hable en su trabajo acerca de tomar licencia cuando llegue el beb.  Prepare el bolso que llevar al hospital.  Prepare la habitacin del beb.  Concurra a los controles mdicos.  Compre un asiento de seguridad orientado hacia atrs para llevar al beb en el automvil. Aprenda cmo instalarlo en el auto. Instrucciones generales  No se d baos de inmersin en agua caliente, baos turcos ni saunas.  No consuma ningn producto que contenga nicotina o tabaco, como cigarrillos y cigarrillos   electrnicos. Si necesita ayuda para dejar de fumar, consulte al mdico.  No beba alcohol.  No se haga duchas vaginales ni use tampones o toallas higinicas perfumadas.  No mantenga las piernas cruzadas durante mucho tiempo.  No haga viajes de larga distancia, excepto si es  obligatorio. Hgalos solamente si su mdico la autoriza.  Visite a su dentista si no lo ha hecho durante el embarazo. Use un cepillo de cerdas suaves para cepillarse los dientes. Psese el hilo dental con suavidad.  Evite el contacto con las bandejas sanitarias de los gatos y la tierra que estos animales usan. Estos elementos contienen bacterias que pueden causar defectos congnitos al beb y la posible prdida del beb (aborto espontneo) o la muerte fetal.  Concurra a todas las visitas prenatales como se lo haya indicado el mdico. Esto es importante. Comunquese con un mdico si:  No est segura de si est en trabajo de parto o si ha roto la bolsa de las aguas.  Tiene mareos.  Tiene clicos leves o siente presin en la parte baja del vientre.  Sufre un dolor persistente en el abdomen.  Sigue teniendo malestar estomacal, vomita o tiene heces lquidas.  Advierte un lquido con olor ftido que proviene de la vagina.  Siente dolor al orinar. Solicite ayuda de inmediato si:  Tiene fiebre.  Tiene una prdida de lquido por la vagina.  Tiene sangrado o pequeas prdidas vaginales.  Siente dolor intenso o clicos en el abdomen.  Aumenta o baja de peso rpidamente.  Tiene dificultades para recuperar el aliento y siente dolor en el pecho.  Sbitamente se le hinchan mucho el rostro, las manos, los tobillos, los pies o las piernas.  No ha sentido los movimientos del beb durante una hora.  Siente un dolor de cabeza intenso que no se alivia con medicamentos.  Tiene dificultad para ver.  Tiene prdida de lquido o le sale un chorro de lquido de la vagina antes de estar en la semana 37.  Tiene espasmos abdominales (contracciones) regulares antes de estar en la semana 37. Resumen  El tercer trimestre comprende desde la semana28 hasta la semana40 (desde el mes7 hasta el mes9). Esta es la poca en que el beb en gestacin crece muy rpidamente.  Siga los consejos del mdico  con respecto a los medicamentos, la alimentacin y la actividad.  Preprese para la llegada del beb tomando las clases prenatales, preparando todo lo que necesitar el beb, arreglando la habitacin del beb y concurriendo a los controles mdicos.  Solicite ayuda de inmediato si tiene sangrado por la vagina, siente dolor en el pecho o tiene dificultad para respirar, o si no ha sentido que su beb se mueve en el transcurso de ms de una hora. Esta informacin no tiene como fin reemplazar el consejo del mdico. Asegrese de hacerle al mdico cualquier pregunta que tenga. Document Revised: 10/29/2016 Document Reviewed: 10/29/2016 Elsevier Patient Education  2020 Elsevier Inc.  

## 2020-04-09 NOTE — L&D Delivery Note (Addendum)
OB/GYN Faculty Practice Delivery Note  [name] is a 40 y.o. [id number] s/p SVD at [redacted]w[redacted]d. She was admitted for active labor.   ROM: 11h 64m with bloody, light meconium fluid GBS Status: negative  Maximum Maternal Temperature: 98.7  Labor Progress: SVE 3cm on admission with painful contractions. Pitocin started and labor progressed well   Delivery Date/Time: 05/11/20, 1102 Delivery: Called to room and patient was complete and pushing. Head delivered LOA. Nuchal x1, reduced after delivery. Shoulder and body delivered in usual fashion. Infant with spontaneous cry, placed on mother's abdomen, dried and stimulated. Cord clamped x 2 after 1-minute delay, and cut by nursing student under my direct supervision. Cord blood drawn. Placenta delivered spontaneously with gentle cord traction. Fundus firm with massage and Pitocin. Labia, perineum, vagina, and cervix were inspected, with 1st degree perineal laceration not needing repair.   Placenta: complete, three vessel cord appreciated Complications: none  Lacerations: 1st degree perineal, no  EBL: 300 mL Analgesia: none   Infant: APGARs 8,8  2880g  Franchot Erichsen, DO  [facility] [employer], [facility]    OB FELLOW DELIVERY ATTESTATION  I was gloved and present for the delivery in its entirety, and I agree with the above resident's note.    Marcy Siren, D.O. OB Fellow  05/11/2020, 6:34 PM

## 2020-04-12 ENCOUNTER — Other Ambulatory Visit: Payer: Self-pay

## 2020-04-13 ENCOUNTER — Ambulatory Visit: Payer: Self-pay | Attending: Obstetrics and Gynecology

## 2020-04-13 ENCOUNTER — Encounter: Payer: Self-pay | Admitting: *Deleted

## 2020-04-13 ENCOUNTER — Other Ambulatory Visit: Payer: Self-pay | Admitting: *Deleted

## 2020-04-13 ENCOUNTER — Other Ambulatory Visit: Payer: Self-pay

## 2020-04-13 ENCOUNTER — Ambulatory Visit: Payer: Self-pay | Admitting: *Deleted

## 2020-04-13 VITALS — BP 113/58 | HR 69

## 2020-04-13 DIAGNOSIS — O24415 Gestational diabetes mellitus in pregnancy, controlled by oral hypoglycemic drugs: Secondary | ICD-10-CM

## 2020-04-13 DIAGNOSIS — O09213 Supervision of pregnancy with history of pre-term labor, third trimester: Secondary | ICD-10-CM

## 2020-04-13 DIAGNOSIS — Z8759 Personal history of other complications of pregnancy, childbirth and the puerperium: Secondary | ICD-10-CM | POA: Insufficient documentation

## 2020-04-13 DIAGNOSIS — O09293 Supervision of pregnancy with other poor reproductive or obstetric history, third trimester: Secondary | ICD-10-CM

## 2020-04-13 DIAGNOSIS — Z3A33 33 weeks gestation of pregnancy: Secondary | ICD-10-CM

## 2020-04-13 DIAGNOSIS — O09523 Supervision of elderly multigravida, third trimester: Secondary | ICD-10-CM

## 2020-04-14 ENCOUNTER — Telehealth (INDEPENDENT_AMBULATORY_CARE_PROVIDER_SITE_OTHER): Payer: Self-pay | Admitting: Obstetrics and Gynecology

## 2020-04-14 ENCOUNTER — Encounter: Payer: Self-pay | Admitting: Obstetrics and Gynecology

## 2020-04-14 DIAGNOSIS — Z758 Other problems related to medical facilities and other health care: Secondary | ICD-10-CM

## 2020-04-14 DIAGNOSIS — N949 Unspecified condition associated with female genital organs and menstrual cycle: Secondary | ICD-10-CM

## 2020-04-14 DIAGNOSIS — O24415 Gestational diabetes mellitus in pregnancy, controlled by oral hypoglycemic drugs: Secondary | ICD-10-CM

## 2020-04-14 DIAGNOSIS — Z789 Other specified health status: Secondary | ICD-10-CM

## 2020-04-14 DIAGNOSIS — Z3480 Encounter for supervision of other normal pregnancy, unspecified trimester: Secondary | ICD-10-CM

## 2020-04-14 DIAGNOSIS — Z8759 Personal history of other complications of pregnancy, childbirth and the puerperium: Secondary | ICD-10-CM

## 2020-04-14 NOTE — Progress Notes (Signed)
TELEHEALTH OBSTETRICS VISIT ENCOUNTER NOTE  Provider location: [facility] [employer] at [place]   I connected with [name] on 04/18/20 at  8:35 AM EST by telephone at home and verified that I am speaking with the correct person using two identifiers.   I discussed the limitations, risks, security and privacy concerns of performing an evaluation and management service by telephone and the availability of in person appointments. I also discussed with the patient that there may be a patient responsible charge related to this service. The patient expressed understanding and agreed to proceed.  Subjective:  [name] is a 40 y.o. [phone] at [redacted]w[redacted]d being followed for ongoing prenatal care.  She is currently monitored for the following issues for this high-risk pregnancy and has History of stillbirth; Supervision of other normal pregnancy, antepartum; Language barrier; Gestational diabetes; and Round ligament pain on their problem list.  Patient reports abdominal pain and pelvic pain. Reports pain with walking and movement. Has never had this pain before.  Has never had this pain Reports fetal movement. Denies any contractions, bleeding or leaking of fluid.  + fetal movement, No vaginal bleeding.   The following portions of the patient's history were reviewed and updated as appropriate: allergies, current medications, past family history, past medical history, past social history, past surgical history and problem list.   Objective:   General:  Alert, oriented and cooperative.   Mental Status: Normal mood and affect perceived. Normal judgment and thought content.  Rest of physical exam deferred due to type of encounter  Assessment and Plan:  Pregnancy: [id number] at [redacted]w[redacted]d  1. Gestational diabetes mellitus (GDM) controlled on oral hypoglycemic drug, antepartum  Continue metformin and BASA BS overall good Fasting BS 3/6 readings are 95 or greater. Recommended protein  snack prior to bed. Reviewed foods that are high in protein.  Majority of PP readings are less than 120.   2. Supervision of other normal pregnancy, antepartum  BP 113/58 today.  3. History of stillbirth Continue Antenatal testing   4. Language barrier  Spanish interpretor   5. Round ligament pain Encourage  Pregnancy support belt. She does not have insurance. Recommend buying on [employer].  Changes positions slowly If symptoms worsen, call the office for in person visit.    Preterm labor symptoms and general obstetric precautions including but not limited to vaginal bleeding, contractions, leaking of fluid and fetal movement were reviewed in detail with the patient.  I discussed the assessment and treatment plan with the patient. The patient was provided an opportunity to ask questions and all were answered. The patient agreed with the plan and demonstrated an understanding of the instructions. The patient was advised to call back or seek an in-person office evaluation/go to [facility] at [facility] for any urgent or concerning symptoms. Please refer to After Visit Summary for other counseling recommendations.   I provided 15 minutes of non-face-to-face time during this encounter.  Return in about 2 weeks (around 04/28/2020), or In person visit for GBS and cervix check..  Future Appointments  Date Time Provider Department Center  04/20/2020  8:30 AM [facility] NURSE [facility]  04/20/2020  8:45 AM [facility] US4 [facility]  04/27/2020  8:30 AM [facility]URSE [facility]  04/27/2020  8:45 AM [facility] US4 [facility]  04/28/2020 10:15 AM Constant, Peggy, MD [facility] None  05/04/2020  9:15 AM [facility] NURSE [facility]  05/04/2020  9:30 AM [facility]  05/11/2020  9:00  AM [facility] NURSE [facility]  05/11/2020  9:15 AM [facility] US2 [facility]  05/18/2020  9:00 AM [facility]SE [facility]  05/18/2020  9:15 AM [facility] US2 [facility]    Venia Carbon, NP [facility]  [employer], [facility]

## 2020-04-14 NOTE — Progress Notes (Signed)
Virtual ROB [redacted]w[redacted]d   CC: Abdominal pain onset yesterday, pelvic pain

## 2020-04-20 ENCOUNTER — Ambulatory Visit: Payer: Self-pay | Admitting: *Deleted

## 2020-04-20 ENCOUNTER — Encounter: Payer: Self-pay | Admitting: *Deleted

## 2020-04-20 ENCOUNTER — Ambulatory Visit: Payer: Self-pay | Attending: Obstetrics and Gynecology

## 2020-04-20 ENCOUNTER — Other Ambulatory Visit: Payer: Self-pay

## 2020-04-20 VITALS — BP 117/57 | HR 84

## 2020-04-20 DIAGNOSIS — O09213 Supervision of pregnancy with history of pre-term labor, third trimester: Secondary | ICD-10-CM

## 2020-04-20 DIAGNOSIS — Z3A34 34 weeks gestation of pregnancy: Secondary | ICD-10-CM

## 2020-04-20 DIAGNOSIS — O09293 Supervision of pregnancy with other poor reproductive or obstetric history, third trimester: Secondary | ICD-10-CM

## 2020-04-20 DIAGNOSIS — O24415 Gestational diabetes mellitus in pregnancy, controlled by oral hypoglycemic drugs: Secondary | ICD-10-CM

## 2020-04-20 DIAGNOSIS — Z8759 Personal history of other complications of pregnancy, childbirth and the puerperium: Secondary | ICD-10-CM

## 2020-04-20 DIAGNOSIS — O09523 Supervision of elderly multigravida, third trimester: Secondary | ICD-10-CM

## 2020-04-27 ENCOUNTER — Other Ambulatory Visit: Payer: Self-pay

## 2020-04-27 ENCOUNTER — Ambulatory Visit: Payer: Self-pay | Attending: Obstetrics and Gynecology

## 2020-04-27 ENCOUNTER — Ambulatory Visit: Payer: Self-pay | Admitting: *Deleted

## 2020-04-27 ENCOUNTER — Encounter: Payer: Self-pay | Admitting: *Deleted

## 2020-04-27 VITALS — BP 124/66 | HR 82

## 2020-04-27 DIAGNOSIS — O09523 Supervision of elderly multigravida, third trimester: Secondary | ICD-10-CM

## 2020-04-27 DIAGNOSIS — O09293 Supervision of pregnancy with other poor reproductive or obstetric history, third trimester: Secondary | ICD-10-CM

## 2020-04-27 DIAGNOSIS — O09213 Supervision of pregnancy with history of pre-term labor, third trimester: Secondary | ICD-10-CM

## 2020-04-27 DIAGNOSIS — O24415 Gestational diabetes mellitus in pregnancy, controlled by oral hypoglycemic drugs: Secondary | ICD-10-CM

## 2020-04-27 DIAGNOSIS — E669 Obesity, unspecified: Secondary | ICD-10-CM

## 2020-04-27 DIAGNOSIS — Z3A35 35 weeks gestation of pregnancy: Secondary | ICD-10-CM

## 2020-04-27 DIAGNOSIS — Z8759 Personal history of other complications of pregnancy, childbirth and the puerperium: Secondary | ICD-10-CM

## 2020-04-28 ENCOUNTER — Other Ambulatory Visit (HOSPITAL_COMMUNITY)
Admission: RE | Admit: 2020-04-28 | Discharge: 2020-04-28 | Disposition: A | Discharge status: Home / Self Care | Payer: Self-pay | Source: Ambulatory Visit | Attending: Obstetrics and Gynecology | Admitting: Obstetrics and Gynecology

## 2020-04-28 ENCOUNTER — Ambulatory Visit: Payer: Self-pay | Admitting: Obstetrics and Gynecology

## 2020-04-28 ENCOUNTER — Encounter: Payer: Self-pay | Admitting: Obstetrics and Gynecology

## 2020-04-28 VITALS — BP 121/74 | HR 99 | Wt 167.0 lb

## 2020-04-28 DIAGNOSIS — Z3480 Encounter for supervision of other normal pregnancy, unspecified trimester: Secondary | ICD-10-CM | POA: Insufficient documentation

## 2020-04-28 DIAGNOSIS — Z758 Other problems related to medical facilities and other health care: Secondary | ICD-10-CM

## 2020-04-28 DIAGNOSIS — Z3A35 35 weeks gestation of pregnancy: Secondary | ICD-10-CM

## 2020-04-28 DIAGNOSIS — Z8759 Personal history of other complications of pregnancy, childbirth and the puerperium: Secondary | ICD-10-CM

## 2020-04-28 DIAGNOSIS — O24415 Gestational diabetes mellitus in pregnancy, controlled by oral hypoglycemic drugs: Secondary | ICD-10-CM

## 2020-04-28 DIAGNOSIS — Z789 Other specified health status: Secondary | ICD-10-CM

## 2020-04-28 NOTE — Progress Notes (Signed)
   PRENATAL VISIT NOTE  Subjective:  [name] is a 40 y.o. [phone] at [redacted]w[redacted]d being seen today for ongoing prenatal care.  She is currently monitored for the following issues for this high-risk pregnancy and has History of stillbirth; Supervision of other normal pregnancy, antepartum; Language barrier; and Gestational diabetes on their problem list.  Patient reports no complaints.  Contractions: Not present. Vag. Bleeding: None.  Movement: Present. Denies leaking of fluid.   The following portions of the patient's history were reviewed and updated as appropriate: allergies, current medications, past family history, past medical history, past social history, past surgical history and problem list.   Objective:   Vitals:   04/28/20 1023  BP: 121/74  Pulse: 99  Weight: 167 lb (75.8 kg)    Fetal Status: Fetal Heart Rate (bpm): 154   Movement: Present     General:  Alert, oriented and cooperative. Patient is in no acute distress.  Skin: Skin is warm and dry. No rash noted.   Cardiovascular: Normal heart rate noted  Respiratory: Normal respiratory effort, no problems with respiration noted  Abdomen: Soft, gravid, appropriate for gestational age.  Pain/Pressure: Absent     Pelvic: Cervical exam performed in the presence of a chaperone        Extremities: Normal range of motion.  Edema: None  Mental Status: Normal mood and affect. Normal behavior. Normal judgment and thought content.   Assessment and Plan:  Pregnancy: [id number] at [redacted]w[redacted]d 1. Supervision of other normal pregnancy, antepartum Patient is doing well without complaints Cultures today  2. Gestational diabetes mellitus (GDM) in third trimester controlled on oral hypoglycemic drug CBGs reviewed and all within range. Continue metformin and ASA Continue weekly antenatal testing- BPP 8/8 04/27/20  3. History of stillbirth Plan for IOL at 39 weeks to be scheduled at a subsequent visit  4. Language barrier Spanish  interpreter present during the encounter  Preterm labor symptoms and general obstetric precautions including but not limited to vaginal bleeding, contractions, leaking of fluid and fetal movement were reviewed in detail with the patient. Please refer to After Visit Summary for other counseling recommendations.   Return in about 1 week (around 05/05/2020) for in person, ROB, High risk.  Future Appointments  Date Time Provider Department Center  05/04/2020  9:15 AM [facility] NURSE [facility]  05/04/2020  9:30 AM [facility] US3 [facility]  05/11/2020  9:00 AM [facility] NURSE [facility]  05/11/2020  9:15 AM [facility] US2 [facility]  05/18/2020  9:00 AM [facility] NURSE [facility]  05/18/2020  9:15 AM [facility] US2 [facility]    Catalina Antigua, MD

## 2020-04-28 NOTE — Progress Notes (Signed)
Pt presents for ROB/GBS/GC/CT Pt has CBG readings available

## 2020-04-29 LAB — CERVICOVAGINAL ANCILLARY ONLY
Chlamydia: NEGATIVE
Comment: NEGATIVE
Comment: NORMAL
Neisseria Gonorrhea: NEGATIVE

## 2020-05-02 LAB — CULTURE, BETA STREP (GROUP B ONLY): Strep Gp B Culture: NEGATIVE

## 2020-05-04 ENCOUNTER — Other Ambulatory Visit: Payer: Self-pay

## 2020-05-04 ENCOUNTER — Ambulatory Visit: Payer: Self-pay | Attending: Obstetrics and Gynecology

## 2020-05-04 ENCOUNTER — Encounter: Payer: Self-pay | Admitting: *Deleted

## 2020-05-04 ENCOUNTER — Ambulatory Visit: Payer: Self-pay | Admitting: *Deleted

## 2020-05-04 VITALS — BP 119/66 | HR 83

## 2020-05-04 DIAGNOSIS — O09523 Supervision of elderly multigravida, third trimester: Secondary | ICD-10-CM

## 2020-05-04 DIAGNOSIS — O24415 Gestational diabetes mellitus in pregnancy, controlled by oral hypoglycemic drugs: Secondary | ICD-10-CM | POA: Insufficient documentation

## 2020-05-05 ENCOUNTER — Encounter: Payer: Self-pay | Admitting: Obstetrics and Gynecology

## 2020-05-05 ENCOUNTER — Telehealth (INDEPENDENT_AMBULATORY_CARE_PROVIDER_SITE_OTHER): Payer: Self-pay | Admitting: Obstetrics and Gynecology

## 2020-05-05 DIAGNOSIS — Z3A36 36 weeks gestation of pregnancy: Secondary | ICD-10-CM

## 2020-05-05 DIAGNOSIS — O24415 Gestational diabetes mellitus in pregnancy, controlled by oral hypoglycemic drugs: Secondary | ICD-10-CM

## 2020-05-05 DIAGNOSIS — Z789 Other specified health status: Secondary | ICD-10-CM

## 2020-05-05 DIAGNOSIS — Z603 Acculturation difficulty: Secondary | ICD-10-CM

## 2020-05-05 DIAGNOSIS — Z3480 Encounter for supervision of other normal pregnancy, unspecified trimester: Secondary | ICD-10-CM

## 2020-05-05 DIAGNOSIS — Z8759 Personal history of other complications of pregnancy, childbirth and the puerperium: Secondary | ICD-10-CM

## 2020-05-05 NOTE — Progress Notes (Signed)
OBSTETRICS PRENATAL VIRTUAL VISIT ENCOUNTER NOTE  Provider location: [facility] [place]   I connected with [name] on 05/05/20 at  4:15 PM EST by MyChart Video Encounter at home and verified that I am speaking with the correct person using two identifiers.   I discussed the limitations, risks, security and privacy concerns of performing an evaluation and management service virtually and the availability of in person appointments. I also discussed with the patient that there may be a patient responsible charge related to this service. The patient expressed understanding and agreed to proceed. Subjective:  [name] is a 40 y.o. [phone] at [redacted]w[redacted]d being seen today for ongoing prenatal care.  She is currently monitored for the following issues for this high-risk pregnancy and has History of stillbirth; Supervision of other normal pregnancy, antepartum; Language barrier; and Gestational diabetes on their problem list.  Patient reports no complaints.   .  .   . Denies any leaking of fluid.   The following portions of the patient's history were reviewed and updated as appropriate: allergies, current medications, past family history, past medical history, past social history, past surgical history and problem list.   Objective:  There were no vitals filed for this visit.  Fetal Status:           General:  Alert, oriented and cooperative. Patient is in no acute distress.  Respiratory: Normal respiratory effort, no problems with respiration noted  Mental Status: Normal mood and affect. Normal behavior. Normal judgment and thought content.  Rest of physical exam deferred due to type of encounter  Imaging: [place] MFM FETAL BPP WO NON STRESS  Result Date: 05/04/2020 ----------------------------------------------------------------------  OBSTETRICS REPORT                       (Signed Final 05/04/2020 10:14 am)  ---------------------------------------------------------------------- Patient Info  ID #:       [id number]                          D.O.B.:  1981-02-07 (39 yrs)  Name:       [name]            Visit Date: 05/04/2020 09:44 am ---------------------------------------------------------------------- Performed By  Attending:        Ma Rings MD         Ref. Address:     [address]                                                             [phone]  [place]                                                             [address]  Performed By:     Emeline Darling BS,      Location:         [facility]                                     [facility]  Referred By:      [facility] ---------------------------------------------------------------------- Orders  #  Description                           Code        Ordered By  1  [place] MFM FETAL BPP WO NON               76819.01    [facility]     STRESS ----------------------------------------------------------------------  #  Order #                     Accession #                Episode #  1  [id number]                   [id number]                 [id number] ---------------------------------------------------------------------- Indications  Gestational diabetes in pregnancy,             O24.415  controlled by oral hypoglycemic drugs  Poor obstetric history: Previous IUFD          O09.299  (stillbirth 8 months in [place])  Poor obstetric history: Previous preterm       O09.219  delivery, antepartum x2 (36 wks)  Advanced maternal age multigravida 43+,        O71.523  third trimester (40 by delivery)  [redacted] weeks gestation of pregnancy                Z3A.36  Grand multiparity,  antepartum                  O09.40  Neg AFP ---------------------------------------------------------------------- Fetal Evaluation  Num Of Fetuses:         1  Fetal Heart Rate(bpm):  158  Cardiac Activity:       Observed  Presentation:           Cephalic  Placenta:               Anterior  P. Cord Insertion:      Previously Visualized  Amniotic Fluid  AFI FV:      Within normal limits  AFI Sum(cm)     %Tile       Largest Pocket(cm)  10.             22          3.9  RUQ(cm)       RLQ(cm)       LUQ(cm)        LLQ(cm)  1.3           1.1           3.7            3.9 ---------------------------------------------------------------------- Biophysical Evaluation  Amniotic F.V:   Pocket => 2 cm             F. Tone:        Observed  F. Movement:    Observed                   Score:          8/8  F. Breathing:   Observed ---------------------------------------------------------------------- OB History  Gravidity:    7         Term:   3        Prem:   2        SAB:   1  TOP:          0       Ectopic:  0        Living: 4 ---------------------------------------------------------------------- Gestational Age  LMP:           40w 3d        Date:  07/26/19                 EDD:   05/01/20  Best:          Stevie Kern 0d     Det. By:  U/S  (12/15/19)          EDD:   06/01/20 ---------------------------------------------------------------------- Anatomy  Diaphragm:             Appears normal         Bladder:                Appears normal  Stomach:               Appears normal ---------------------------------------------------------------------- Comments  This patient was seen for a biophysical profile due to  advanced maternal age, history of a prior IUFD, and  gestational diabetes currently treated with Metformin.  She  denies any problems since her last exam.  A biophysical profile performed today was 8 out of 8.  There was normal amniotic fluid noted on today's ultrasound  exam.  Due to her history, she should probably be delivered at   around 39 weeks.  Another biophysical profile and growth ultrasound was  scheduled in 1 week. ----------------------------------------------------------------------                   Ma Rings, MD Electronically Signed Final Report   05/04/2020 10:14 am ----------------------------------------------------------------------  [place] MFM FETAL BPP WO NON STRESS  Result Date: 04/27/2020 ----------------------------------------------------------------------  OBSTETRICS  REPORT                       (Signed Final 04/27/2020 12:04 pm) ---------------------------------------------------------------------- Patient Info  ID #:       [id number]                          D.O.B.:  1981/04/03 (39 yrs)  Name:       [name]            Visit Date: 04/27/2020 08:59 am ---------------------------------------------------------------------- Performed By  Attending:        Lin Landsman      Ref. Address:     [address]                    [place]                                                             [address]                                                             [address]                                                             [place]                                                             [address]  Performed By:     Emeline Darling BS,      Location:         [facility]                                     [facility]  Referred By:      [facility] ---------------------------------------------------------------------- Orders  #  Description                           Code        Ordered By  1  [place] MFM FETAL BPP WO NON               76819.01    [facility]     STRESS ----------------------------------------------------------------------  #  Order #                     Accession #                Episode #  1  [id number]                    [id number]                 [id number] ---------------------------------------------------------------------- Indications  Poor obstetric history: Previous IUFD          O09.299  (stillbirth 8 months in [place])  Poor obstetric history: Previous preterm       O09.219  delivery, antepartum x2 (36 wks)  Advanced maternal age multigravida 100+,        O71.523  third trimester (40 by delivery)  Gestational diabetes in pregnancy,             O24.415  controlled by oral hypoglycemic drugs  [redacted] weeks gestation of pregnancy                Z3A.35 ---------------------------------------------------------------------- Fetal Evaluation  Num Of Fetuses:         1  Fetal Heart Rate(bpm):  141  Cardiac Activity:       Observed  Presentation:           Cephalic  Placenta:               Anterior  P. Cord Insertion:      Previously Visualized  Amniotic Fluid  AFI FV:      Within normal limits  AFI Sum(cm)     %Tile       Largest Pocket(cm)  11.88           34          4.81  RUQ(cm)       RLQ(cm)       LUQ(cm)        LLQ(cm)  4.81          1.41          0.96           4.7 ---------------------------------------------------------------------- Biophysical Evaluation  Amniotic F.V:   Pocket => 2 cm             F. Tone:        Observed  F. Movement:    Observed                   Score:          8/8  F. Breathing:   Observed ---------------------------------------------------------------------- OB History  Gravidity:    7         Term:   3        Prem:   2        SAB:   1  TOP:          0  Ectopic:  0        Living: 4 ---------------------------------------------------------------------- Gestational Age  LMP:           39w 3d        Date:  07/26/19                 EDD:   05/01/20  Best:          Consuello Closs 0d     Det. By:  U/S  (12/15/19)          EDD:   06/01/20 ---------------------------------------------------------------------- Anatomy  Abdomen:               Visualized             Bladder:                Visualized  ---------------------------------------------------------------------- Impression  Antenatal testing performed given advanced maternal age at  89 and history of term IUFD and A2GDM  The biophysical profile was 8/8 with good fetal movement and  amniotic fluid volume. ---------------------------------------------------------------------- Recommendations  Continue weekly testing.  Consider delivery by 39 weeks.  Repeat growth in 2 weeks. ----------------------------------------------------------------------               Lin Landsman, MD Electronically Signed Final Report   04/27/2020 12:04 pm ----------------------------------------------------------------------  [place] MFM FETAL BPP WO NON STRESS  Result Date: 04/20/2020 ----------------------------------------------------------------------  OBSTETRICS REPORT                       (Signed Final 04/20/2020 09:54 am) ---------------------------------------------------------------------- Patient Info  ID #:       [id number]                          D.O.B.:  [birth year] (39 yrs)  Name:       [name]            Visit Date: 04/20/2020 08:59 am ---------------------------------------------------------------------- Performed By  Attending:        Lin Landsman      Ref. Address:     [address]                    [place]                                                             Road                                                             [address]                                                             [place]  [address]  Performed By:     Ceasar Lund,       [facility]  Referred By:      [facility]  ---------------------------------------------------------------------- Orders  #  Description                           Code        Ordered By  1  [place] MFM FETAL BPP WO NON               76819.01    [facility]     STRESS ----------------------------------------------------------------------  #  Order #                     Accession #                Episode #  1  [id number]                   [id number]                 [id number] ---------------------------------------------------------------------- Indications  Poor obstetric history: Previous IUFD          O09.299  (stillbirth 8 months in [place])  Poor obstetric history: Previous preterm       O09.219  delivery, antepartum x2 (36 wks)  Advanced maternal age multigravida 29+,        O55.523  third trimester (40 by delivery)  Gestational diabetes in pregnancy,             O24.415  controlled by oral hypoglycemic drugs  [redacted] weeks gestation of pregnancy                Z3A.34 ---------------------------------------------------------------------- Fetal Evaluation  Num Of Fetuses:         1  Fetal Heart Rate(bpm):  143  Cardiac Activity:       Observed  Presentation:           Cephalic  Placenta:               Anterior  P. Cord Insertion:      Previously Visualized  Amniotic Fluid  AFI FV:      Within normal limits  AFI Sum(cm)     %Tile       Largest Pocket(cm)  17.8  65          5.9  RUQ(cm)       RLQ(cm)       LUQ(cm)        LLQ(cm)  5.9           4.6           4.4            2.9 ---------------------------------------------------------------------- Biophysical Evaluation  Amniotic F.V:   Within normal limits       F. Tone:        Observed  F. Movement:    Observed                   Score:          8/8  F. Breathing:   Observed ---------------------------------------------------------------------- OB History  Gravidity:    7         Term:   3        Prem:   2        SAB:   1  TOP:          0       Ectopic:  0        Living: 4  ---------------------------------------------------------------------- Gestational Age  LMP:           38w 3d        Date:  07/26/19                 EDD:   05/01/20  Best:          34w 0d     Det. By:  U/S  (12/15/19)          EDD:   06/01/20 ---------------------------------------------------------------------- Anatomy  Stomach:               Visualized             Bladder:                Visualized ---------------------------------------------------------------------- Impression  Antenatal testing performed given maternal A2GDM  The biophysical profile was 8/8 with good fetal movement and  amniotic fluid volume.  Follow up BPP is scheduled. ---------------------------------------------------------------------- Recommendations  Continue weekly testing as previously scheduled.  Repeat growth in 3 weeks. ----------------------------------------------------------------------               Lin Landsman, MD Electronically Signed Final Report   04/20/2020 09:54 am ----------------------------------------------------------------------  [place] MFM FETAL BPP WO NON STRESS  Result Date: 04/13/2020 ----------------------------------------------------------------------  OBSTETRICS REPORT                       (Signed Final 04/13/2020 11:39 am) ---------------------------------------------------------------------- Patient Info  ID #:       [id number]                          D.O.B.:  1980-06-02 (39 yrs)  Name:       [name]            Visit Date: 04/13/2020 07:50 am ---------------------------------------------------------------------- Performed By  Attending:        Noralee Space MD        Ref. Address:     [address]  [address]                                                             [address]                                                             [place]                                                             [id number]  Performed By:      Jenel Luckshelsea Shortridge     Location:         [facility]  [facility] By:      [facility] ---------------------------------------------------------------------- Orders  #  Description                           Code        Ordered By  1  US MFM FETAL BPP WO NON               76819.01    [facility]     STRESS  2  US MFM OB FOLLOW UP                   [id number]    YU FANG ----------------------------------------------------------------------  #  Order #                     Accession #                Episode #  1  [id number]                   [id number]                 [id number]  2  [id number]                   [id number]                 [id number] ---------------------------------------------------------------------- Indications  [redacted] weeks gestation of pregnancy                Z3A.33  Poor obstetric history: Previous IUFD          O09.299  (stillbirth 8 months in [place])  Poor obstetric history: Previous preterm       O09.219  delivery, antepartum x2 (36 wks)  Advanced maternal age multigravida 28+,        O1.523  third trimester (40 by delivery)  Gestational diabetes in pregnancy,             O24.415  controlled by oral hypoglycemic drugs ---------------------------------------------------------------------- Fetal Evaluation  Num Of Fetuses:         1  Fetal Heart Rate(bpm):  131  Cardiac Activity:       Observed  Presentation:           Cephalic  Placenta:               Anterior  P. Cord Insertion:      Previously Visualized  Amniotic Fluid  AFI FV:      Within normal limits  AFI Sum(cm)     %Tile       Largest Pocket(cm)  9.58            15          3.31  RUQ(cm)       RLQ(cm)       LUQ(cm)        LLQ(cm)  2.8           1.53          1.94           3.31  ---------------------------------------------------------------------- Biophysical Evaluation  Amniotic F.V:   Pocket => 2 cm             F. Tone:        Observed  F. Movement:    Observed                   Score:          8/8  F. Breathing:   Observed ---------------------------------------------------------------------- Biometry  BPD:      80.5  mm     G. Age:  32w 2d         24  %    CI:        72.59   %    70 - 86                                                          FL/HC:      21.3   %    19.9 - 21.5  HC:      300.5  mm     G. Age:  33w 2d         21  %    HC/AC:      1.00        0.96 - 1.11  AC:      300.5  mm     G. Age:  34w 0d         79  %    FL/BPD:     79.5   %    71 - 87  FL:         64  mm     G. Age:  33w 0d         40  %    FL/AC:      21.3   %    20 - 24  Est. FW:    2212  gm    4 lb 14 oz      57  % ---------------------------------------------------------------------- OB History  Gravidity:    7         Term:   3        Prem:   2        SAB:   1  TOP:          0       Ectopic:  0        Living: 4 ---------------------------------------------------------------------- Gestational Age  LMP:           37w 3d        Date:  07/26/19                 EDD:   05/01/20  U/S Today:     33w 1d                                        EDD:   05/31/20  Best:          33w 0d     Det. By:  U/S  (12/15/19)          EDD:   06/01/20 ---------------------------------------------------------------------- Anatomy  Cranium:               Appears normal         Aortic Arch:            Previously seen  Cavum:                 Previously seen        Ductal Arch:            Previously seen  Ventricles:            Previously seen        Diaphragm:              Appears normal  Choroid Plexus:        Previously seen        Stomach:                Appears normal, left                                                                        sided  Cerebellum:            Previously seen        Abdomen:                Appears normal   Posterior Fossa:       Previously seen  Abdominal Wall:         Previously seen  Nuchal Fold:           Previously seen        Cord Vessels:           Previously seen  Face:                  Orbits and profile     Kidneys:                Appear normal                         previously seen  Lips:                  Previously seen        Bladder:                Appears normal  Thoracic:              Appears normal         Spine:                  Previously seen  Heart:                 Appears normal         Upper Extremities:      Previously seen  RVOT:                  Previously seen        Lower Extremities:      Previously seen  LVOT:                  Previously seen  Other:  Heels, open hands/5th digits, Nasal bone, and VC, 3VV and 3VTV          previously visualized. ---------------------------------------------------------------------- Cervix Uterus Adnexa  Cervix  Not visualized (advanced GA >24wks)  Uterus  No abnormality visualized.  Right Ovary  Within normal limits.  Left Ovary  Within normal limits. ---------------------------------------------------------------------- Impression  Gestational diabetes. Patient takes metformin for control .  History of stillbirth followed by 4 term vaginal deliveries. All  her children are in good health.  Amniotic fluid is normal and good fetal activity is seen .Fetal  growth is appropriate for gestational age .Antenatal testing is  reassuring. BPP 8/8. ---------------------------------------------------------------------- Recommendations  -Continue weekly BPP till delivery. ----------------------------------------------------------------------                  Noralee Space, MD Electronically Signed Final Report   04/13/2020 11:39 am ----------------------------------------------------------------------  [place] MFM OB FOLLOW UP  Result Date: 04/13/2020 ----------------------------------------------------------------------  OBSTETRICS REPORT                        (Signed Final 04/13/2020 11:39 am) ---------------------------------------------------------------------- Patient Info  ID #:       [id number]                          D.O.B.:  [birth year] (39 yrs)  Name:       [name]            Visit Date: 04/13/2020 07:50 am ---------------------------------------------------------------------- Performed By  Attending:        Noralee Space MD        Ref. Address:     [address]  [address]                                                             [address]                                                             [place]                                                             [address]  Performed By:     Jenel Lucks     [facility]  Referred By:      [facility] ---------------------------------------------------------------------- Orders  #  Description                           Code        Ordered By  1  [place] MFM FETAL BPP WO NON               76819.01    [facility]     STRESS  2  [place] MFM OB FOLLOW UP                   [id number]    YU FANG ----------------------------------------------------------------------  #  Order #                     Accession #                Episode #  1  [id number]                   [id number]                 [id number]  2  [id number]  [id number]                 [id number] ---------------------------------------------------------------------- Indications  [redacted] weeks gestation of pregnancy                Z3A.33  Poor obstetric history: Previous IUFD          O09.299  (stillbirth 8 months in [place])  Poor obstetric history: Previous preterm       O09.219  delivery, antepartum x2 (36 wks)  Advanced maternal age multigravida 84+,         O54.523  third trimester (40 by delivery)  Gestational diabetes in pregnancy,             O24.415  controlled by oral hypoglycemic drugs ---------------------------------------------------------------------- Fetal Evaluation  Num Of Fetuses:         1  Fetal Heart Rate(bpm):  131  Cardiac Activity:       Observed  Presentation:           Cephalic  Placenta:               Anterior  P. Cord Insertion:      Previously Visualized  Amniotic Fluid  AFI FV:      Within normal limits  AFI Sum(cm)     %Tile       Largest Pocket(cm)  9.58            15          3.31  RUQ(cm)       RLQ(cm)       LUQ(cm)        LLQ(cm)  2.8           1.53          1.94           3.31 ---------------------------------------------------------------------- Biophysical Evaluation  Amniotic F.V:   Pocket => 2 cm             F. Tone:        Observed  F. Movement:    Observed                   Score:          8/8  F. Breathing:   Observed ---------------------------------------------------------------------- Biometry  BPD:      80.5  mm     G. Age:  32w 2d         24  %    CI:        72.59   %    70 - 86                                                          FL/HC:      21.3   %    19.9 - 21.5  HC:      300.5  mm     G. Age:  33w 2d         21  %    HC/AC:      1.00        0.96 - 1.11  AC:      300.5  mm     G. Age:  34w 0d         79  %    FL/BPD:  79.5   %    71 - 87  FL:         64  mm     G. Age:  33w 0d         40  %    FL/AC:      21.3   %    20 - 24  Est. FW:    2212  gm    4 lb 14 oz      57  % ---------------------------------------------------------------------- OB History  Gravidity:    7         Term:   3        Prem:   2        SAB:   1  TOP:          0       Ectopic:  0        Living: 4 ---------------------------------------------------------------------- Gestational Age  LMP:           37w 3d        Date:  07/26/19                 EDD:   05/01/20  U/S Today:     33w 1d                                        EDD:   05/31/20  Best:           33w 0d     Det. By:  U/S  (12/15/19)          EDD:   06/01/20 ---------------------------------------------------------------------- Anatomy  Cranium:               Appears normal         Aortic Arch:            Previously seen  Cavum:                 Previously seen        Ductal Arch:            Previously seen  Ventricles:            Previously seen        Diaphragm:              Appears normal  Choroid Plexus:        Previously seen        Stomach:                Appears normal, left                                                                        sided  Cerebellum:            Previously seen        Abdomen:                Appears normal  Posterior Fossa:       Previously seen        Abdominal Wall:         Previously seen  Nuchal  Fold:           Previously seen        Cord Vessels:           Previously seen  Face:                  Orbits and profile     Kidneys:                Appear normal                         previously seen  Lips:                  Previously seen        Bladder:                Appears normal  Thoracic:              Appears normal         Spine:                  Previously seen  Heart:                 Appears normal         Upper Extremities:      Previously seen  RVOT:                  Previously seen        Lower Extremities:      Previously seen  LVOT:                  Previously seen  Other:  Heels, open hands/5th digits, Nasal bone, and VC, 3VV and 3VTV          previously visualized. ---------------------------------------------------------------------- Cervix Uterus Adnexa  Cervix  Not visualized (advanced GA >24wks)  Uterus  No abnormality visualized.  Right Ovary  Within normal limits.  Left Ovary  Within normal limits. ---------------------------------------------------------------------- Impression  Gestational diabetes. Patient takes metformin for control .  History of stillbirth followed by 4 term vaginal deliveries. All  her children are in good health.   Amniotic fluid is normal and good fetal activity is seen .Fetal  growth is appropriate for gestational age .Antenatal testing is  reassuring. BPP 8/8. ---------------------------------------------------------------------- Recommendations  -Continue weekly BPP till delivery. ----------------------------------------------------------------------                  Noralee Space, MD Electronically Signed Final Report   04/13/2020 11:39 am ----------------------------------------------------------------------   Assessment and Plan:  Pregnancy: [id number] at [redacted]w[redacted]d 1. Supervision of other normal pregnancy, antepartum Patient is doing well with the exception of generalized pruritis worst in the evening Patient advised to come in for bile acids to rule out cholestasis of pregnancy.   2. Gestational diabetes mellitus (GDM) in third trimester controlled on oral hypoglycemic drug CBG reviewed and highest fasting 94 and highest pp 113 Continue metformin at current dose EFW 2212 gm 57%tile  3. History of stillbirth Plan for IOL at 6 weks- to be scheduled  4. Language barrier Spanish interpreter present during the encounter  Preterm labor symptoms and general obstetric precautions including but not limited to vaginal bleeding, contractions, leaking of fluid and fetal movement were reviewed in detail with the patient. I discussed the assessment and treatment plan with the patient. The patient was provided an opportunity to ask questions and all were answered. The patient agreed  with the plan and demonstrated an understanding of the instructions. The patient was advised to call back or seek an in-person office evaluation/go to MAU at [facility] for any urgent or concerning symptoms. Please refer to After Visit Summary for other counseling recommendations.   I provided 15 minutes of face-to-face time during this encounter.  Return in about 1 week (around 05/12/2020) for in person, ROB, High  risk.  Future Appointments  Date Time Provider Department [facility]  05/05/2020  4:15 PM Somnang Mahan, MD [facility] None  05/11/2020  9:00 AM [facility] NURSE [facility]  05/11/2020  9:15 AM [facility] US2 [facility]  05/18/2020  9:00 AM [facility] NURSE [facility]  05/18/2020  9:15 AM [facility] US2 [facility]    Catalina Antigua, MD [facility] [employer], [facility]

## 2020-05-06 ENCOUNTER — Other Ambulatory Visit: Payer: Self-pay

## 2020-05-08 LAB — BILE ACIDS, TOTAL: Bile Acids Total: 27.1 umol/L (ref 0.0–10.0)

## 2020-05-09 ENCOUNTER — Encounter (HOSPITAL_COMMUNITY): Payer: Self-pay | Admitting: *Deleted

## 2020-05-09 ENCOUNTER — Telehealth (HOSPITAL_COMMUNITY): Payer: Self-pay | Admitting: *Deleted

## 2020-05-09 ENCOUNTER — Other Ambulatory Visit: Payer: Self-pay | Admitting: Obstetrics and Gynecology

## 2020-05-09 ENCOUNTER — Telehealth: Payer: Self-pay

## 2020-05-09 ENCOUNTER — Encounter: Payer: Self-pay | Admitting: Obstetrics and Gynecology

## 2020-05-09 DIAGNOSIS — K831 Obstruction of bile duct: Secondary | ICD-10-CM | POA: Insufficient documentation

## 2020-05-09 DIAGNOSIS — O26619 Liver and biliary tract disorders in pregnancy, unspecified trimester: Secondary | ICD-10-CM | POA: Insufficient documentation

## 2020-05-09 MED ORDER — URSODIOL 500 MG PO TABS: 500.0000 mg | ORAL_TABLET | Freq: Two times a day (BID) | ORAL | 0 refills | Status: DC

## 2020-05-09 NOTE — Telephone Encounter (Signed)
Pt contacted w/ Interpreter and made aware of recent results and Induction date and that Rx has been sent  Pt voiced understanding and had no questions

## 2020-05-09 NOTE — Telephone Encounter (Signed)
-----   Message from Catalina Antigua, MD sent at 05/09/2020 10:28 AM EST ----- Please inform patient of new diagnosis of cholestasis of pregnancy which is responsible for her generalized itching. As I discussed with her during her appointment she will be induced at 37 weeks. Her induction has been scheduled on 05/11/20 and Labor and delivery will contact her with an expected arrival time. Patient should keep all scheduled appointment until told to present to the hospital.  A prescription for Actigall has been sent to the pharmacy to help with the itching. She needs to take it as prescribed daily. She can also take benadryl as needed

## 2020-05-09 NOTE — Telephone Encounter (Signed)
Preadmission screen Interpreter number [phone]

## 2020-05-10 ENCOUNTER — Other Ambulatory Visit: Payer: Self-pay | Admitting: Advanced Practice Midwife

## 2020-05-10 ENCOUNTER — Inpatient Hospital Stay (HOSPITAL_COMMUNITY)
Admission: EM | Admit: 2020-05-10 | Discharge: 2020-05-13 | DRG: 805 | Disposition: A | Discharge status: Home / Self Care | Payer: Medicaid Other | Attending: Family Medicine | Admitting: Family Medicine

## 2020-05-10 ENCOUNTER — Other Ambulatory Visit (HOSPITAL_COMMUNITY)
Admission: RE | Admit: 2020-05-10 | Discharge: 2020-05-10 | Disposition: A | Discharge status: Home / Self Care | Payer: Self-pay | Source: Ambulatory Visit | Attending: Family Medicine | Admitting: Family Medicine

## 2020-05-10 ENCOUNTER — Encounter (HOSPITAL_COMMUNITY): Payer: Self-pay | Admitting: Family Medicine

## 2020-05-10 DIAGNOSIS — Z3A37 37 weeks gestation of pregnancy: Secondary | ICD-10-CM

## 2020-05-10 DIAGNOSIS — O24410 Gestational diabetes mellitus in pregnancy, diet controlled: Secondary | ICD-10-CM

## 2020-05-10 DIAGNOSIS — O9902 Anemia complicating childbirth: Secondary | ICD-10-CM | POA: Diagnosis present

## 2020-05-10 DIAGNOSIS — O2662 Liver and biliary tract disorders in childbirth: Principal | ICD-10-CM | POA: Diagnosis present

## 2020-05-10 DIAGNOSIS — O365930 Maternal care for other known or suspected poor fetal growth, third trimester, not applicable or unspecified: Secondary | ICD-10-CM | POA: Diagnosis present

## 2020-05-10 DIAGNOSIS — Z01812 Encounter for preprocedural laboratory examination: Secondary | ICD-10-CM | POA: Insufficient documentation

## 2020-05-10 DIAGNOSIS — O24415 Gestational diabetes mellitus in pregnancy, controlled by oral hypoglycemic drugs: Secondary | ICD-10-CM

## 2020-05-10 DIAGNOSIS — Z8759 Personal history of other complications of pregnancy, childbirth and the puerperium: Secondary | ICD-10-CM

## 2020-05-10 DIAGNOSIS — O24425 Gestational diabetes mellitus in childbirth, controlled by oral hypoglycemic drugs: Secondary | ICD-10-CM | POA: Diagnosis present

## 2020-05-10 DIAGNOSIS — Z789 Other specified health status: Secondary | ICD-10-CM | POA: Diagnosis present

## 2020-05-10 DIAGNOSIS — O26619 Liver and biliary tract disorders in pregnancy, unspecified trimester: Secondary | ICD-10-CM | POA: Diagnosis present

## 2020-05-10 DIAGNOSIS — O479 False labor, unspecified: Secondary | ICD-10-CM

## 2020-05-10 DIAGNOSIS — O24419 Gestational diabetes mellitus in pregnancy, unspecified control: Secondary | ICD-10-CM | POA: Diagnosis present

## 2020-05-10 DIAGNOSIS — Z20822 Contact with and (suspected) exposure to covid-19: Secondary | ICD-10-CM | POA: Insufficient documentation

## 2020-05-10 DIAGNOSIS — D649 Anemia, unspecified: Secondary | ICD-10-CM | POA: Diagnosis present

## 2020-05-10 DIAGNOSIS — O26649 Intrahepatic cholestasis of pregnancy, unspecified trimester: Secondary | ICD-10-CM | POA: Diagnosis present

## 2020-05-10 DIAGNOSIS — O2663 Liver and biliary tract disorders in the puerperium: Secondary | ICD-10-CM | POA: Diagnosis not present

## 2020-05-10 DIAGNOSIS — Z758 Other problems related to medical facilities and other health care: Secondary | ICD-10-CM | POA: Diagnosis present

## 2020-05-10 DIAGNOSIS — Z603 Acculturation difficulty: Secondary | ICD-10-CM | POA: Diagnosis not present

## 2020-05-10 DIAGNOSIS — K831 Obstruction of bile duct: Secondary | ICD-10-CM | POA: Diagnosis present

## 2020-05-10 DIAGNOSIS — O4202 Full-term premature rupture of membranes, onset of labor within 24 hours of rupture: Secondary | ICD-10-CM | POA: Diagnosis not present

## 2020-05-10 DIAGNOSIS — Z3480 Encounter for supervision of other normal pregnancy, unspecified trimester: Secondary | ICD-10-CM

## 2020-05-10 DIAGNOSIS — O26893 Other specified pregnancy related conditions, third trimester: Secondary | ICD-10-CM | POA: Diagnosis present

## 2020-05-10 LAB — TYPE AND SCREEN
ABO/RH(D): O POS
Antibody Screen: NEGATIVE

## 2020-05-10 LAB — CBC
HCT: 32.9 % — ABNORMAL LOW (ref 36.0–46.0)
Hemoglobin: 10.6 g/dL — ABNORMAL LOW (ref 12.0–15.0)
MCH: 25.7 pg — ABNORMAL LOW (ref 26.0–34.0)
MCHC: 32.2 g/dL (ref 30.0–36.0)
MCV: 79.7 fL — ABNORMAL LOW (ref 80.0–100.0)
Platelets: 319 10*3/uL (ref 150–400)
RBC: 4.13 MIL/uL (ref 3.87–5.11)
RDW: 16.4 % — ABNORMAL HIGH (ref 11.5–15.5)
WBC: 9.4 10*3/uL (ref 4.0–10.5)
nRBC: 0 % (ref 0.0–0.2)

## 2020-05-10 LAB — SARS CORONAVIRUS 2 (TAT 6-24 HRS): SARS Coronavirus 2: NEGATIVE

## 2020-05-10 MED ORDER — ACETAMINOPHEN 325 MG PO TABS
650.0000 mg | ORAL_TABLET | ORAL | Status: DC | PRN
Start: 2020-05-10 — End: 2020-05-11

## 2020-05-10 MED ORDER — OXYTOCIN-SODIUM CHLORIDE 30-0.9 UT/500ML-% IV SOLN
2.5000 [IU]/h | INTRAVENOUS | Status: DC
  Administered 2020-05-11: 2.5 [IU]/h via INTRAVENOUS

## 2020-05-10 MED ORDER — ONDANSETRON HCL 4 MG/2ML IJ SOLN
4.0000 mg | Freq: Four times a day (QID) | INTRAMUSCULAR | Status: DC | PRN
Start: 2020-05-10 — End: 2020-05-11

## 2020-05-10 MED ORDER — LIDOCAINE HCL (PF) 1 % IJ SOLN: 30.0000 mL | INTRAMUSCULAR | Status: DC | PRN

## 2020-05-10 MED ORDER — MISOPROSTOL 25 MCG QUARTER TABLET
25.0000 ug | ORAL_TABLET | ORAL | Status: DC | PRN
  Filled 2020-05-10: qty 1

## 2020-05-10 MED ORDER — OXYCODONE-ACETAMINOPHEN 5-325 MG PO TABS: 2.0000 | ORAL_TABLET | ORAL | Status: DC | PRN

## 2020-05-10 MED ORDER — OXYTOCIN BOLUS FROM INFUSION
333.0000 mL | Freq: Once | INTRAVENOUS | Status: AC
  Administered 2020-05-11: 333 mL via INTRAVENOUS

## 2020-05-10 MED ORDER — TERBUTALINE SULFATE 1 MG/ML IJ SOLN: 0.2500 mg | Freq: Once | INTRAMUSCULAR | Status: DC | PRN

## 2020-05-10 MED ORDER — LACTATED RINGERS IV SOLN: INTRAVENOUS | Status: DC

## 2020-05-10 MED ORDER — SOD CITRATE-CITRIC ACID 500-334 MG/5ML PO SOLN: 30.0000 mL | ORAL | Status: DC | PRN

## 2020-05-10 MED ORDER — OXYCODONE-ACETAMINOPHEN 5-325 MG PO TABS: 1.0000 | ORAL_TABLET | ORAL | Status: DC | PRN

## 2020-05-10 MED ORDER — FENTANYL CITRATE (PF) 100 MCG/2ML IJ SOLN: 100.0000 ug | INTRAMUSCULAR | Status: DC | PRN

## 2020-05-10 MED ORDER — LACTATED RINGERS IV SOLN: 500.0000 mL | INTRAVENOUS | Status: DC | PRN

## 2020-05-10 NOTE — ED Triage Notes (Signed)
Pt is g7, p4, pt of [facility]. Scheduled for induction tomorrow. Having discharge with blood today. Due 2/23.PA at bedside for eval for [facility]

## 2020-05-10 NOTE — MAU Note (Signed)
Yeast like discharge noted on glove after exam-no bleeding or bloody show noted. FHR tracing Cat.1

## 2020-05-10 NOTE — MAU Note (Addendum)
Having bloody mucous and ctxs all day. Went to ED initially and transferred to MAU. 1cm last wk. For IOL tomorrow.

## 2020-05-10 NOTE — H&P (Addendum)
[name] is a 40 y.o. female presenting for painful contractions and bloody discharge was scheduled for induction for tomorrow morning for GDM, Cholestasis, and IUGR  Denies leaking fluid, reports bloody show, through no blood seen on exam.  Pregnancy followed at [facility] office   Patient Active Problem List   Diagnosis Date Noted   Cholestasis during pregnancy 05/10/2020   Cholestasis of pregnancy, antepartum 05/09/2020   Gestational diabetes 03/07/2020   Language barrier 11/11/2019   Supervision of other normal pregnancy, antepartum 11/04/2019   History of stillbirth 08/24/2013     OB History     Gravida  7   Para  5   Term  3   Preterm  2   AB  1   Living  4      SAB  1   IAB  0   Ectopic  0   Multiple  0   Live Births  4          Past Medical History:  Diagnosis Date   Gestational diabetes    Medical history non-contributory    Past Surgical History:  Procedure Laterality Date   APPENDECTOMY     CHOLECYSTECTOMY  12/15/2014   Procedure: LAPAROSCOPIC CHOLECYSTECTOMY, attempted cholangiogram;  Surgeon: Violeta Gelinas, MD;  Location: [facility] OR;  Service: General;;   LAPAROSCOPIC APPENDECTOMY  04/05/2012   Procedure: APPENDECTOMY LAPAROSCOPIC;  Surgeon: Lodema Pilot, DO;  Location: [facility]S;  Service: General;  Laterality: N/A;   Family History: family history is not on file. Social History:  reports that she has never smoked. She has never used smokeless tobacco. She reports that she does not drink alcohol and does not use drugs.     Maternal Diabetes: Yes:  Diabetes Type:  Insulin/Medication controlled Genetic Screening: Normal Maternal Ultrasounds/Referrals: IUGR Fetal Ultrasounds or other Referrals:  None Maternal Substance Abuse:  No Significant Maternal Medications:  Meds include: Other: Metformin Significant Maternal Lab Results:  Group B Strep negative Other Comments:   Cholestasis, IUGR  Review of Systems  Constitutional: Negative  for chills and fever.  Respiratory: Negative for shortness of breath.   Cardiovascular: Negative for leg swelling.  Gastrointestinal: Positive for abdominal pain. Negative for nausea and vomiting.  Genitourinary: Positive for pelvic pain and vaginal bleeding.   Maternal Medical History:  Reason for admission: Contractions and vaginal bleeding.  Nausea.  Contractions: Onset was 1-2 hours ago.   Frequency: regular.   Perceived severity is moderate.    Fetal activity: Perceived fetal activity is normal.   Last perceived fetal movement was within the past hour.    Prenatal complications: Cholelithiasis and IUGR.   No pre-eclampsia or preterm labor.   Prenatal Complications - Diabetes: gestational. Diabetes is managed by oral agent (monotherapy).      Dilation: 3 Effacement (%): 50 Station: -1 Exam by:: Ginnie Smart RN Blood pressure 125/79, pulse 88, temperature 98.3 F (36.8 C), temperature source Oral, resp. rate 17, height 4\' 11"  (1.499 m), weight 76.7 kg, last menstrual period 07/26/2019, SpO2 99 %, unknown if currently breastfeeding. Maternal Exam:  Uterine Assessment: Contraction strength is moderate.  Contraction frequency is regular.   Abdomen: Patient reports no abdominal tenderness. Estimated fetal weight is 6.5.   Fetal presentation: vertex  Introitus: Normal vulva. Normal vagina.  Ferning test: not done.  Nitrazine test: not done.  Pelvis: adequate for delivery.   Cervix: Cervix evaluated by digital exam.     Fetal Exam Fetal Monitor Review: Mode: ultrasound.   Baseline rate:  135-140.  Pattern: accelerations present and no decelerations.    Fetal State Assessment: Category I - tracings are normal.     Physical Exam Constitutional:      Appearance: Normal appearance.  HENT:     Head: Normocephalic.  Cardiovascular:     Rate and Rhythm: Normal rate and regular rhythm.     Heart sounds: Normal heart sounds.  Pulmonary:     Effort: Pulmonary effort is  normal.     Breath sounds: Normal breath sounds.  Abdominal:     Palpations: Abdomen is soft.     Tenderness: There is no abdominal tenderness. There is no guarding or rebound.  Genitourinary:    General: Normal vulva.     Comments: Dilation: 3 Effacement (%): 50 Cervical Position: Anterior Station: -1 Presentation: Vertex Exam by:: Ginnie Smart RN  Musculoskeletal:        General: Normal range of motion.     Cervical back: Normal range of motion.     Right lower leg: No edema.     Left lower leg: No edema.  Skin:    General: Skin is warm and dry.  Neurological:     General: No focal deficit present.     Mental Status: She is alert and oriented to person, place, and time.  Psychiatric:        Mood and Affect: Mood normal.     Prenatal labs: ABO, Rh: O/Positive/-- (08/04 1101) Antibody: Negative (08/04 1101) Rubella: 3.14 (08/04 1101) RPR: Non Reactive (11/24 1002)  HBsAg: Negative (08/04 1101)  HIV: Non Reactive (11/24 1002)  GBS: Negative/-- (01/20 1118)   Assessment:  SIUP @[redacted]w[redacted]d  Latent phase labor Cholestatsis Gestational DM History of Stillbirth IUGR  Plan:  Admit to Labor and delivery Routine orders Observe for labor progression, augment PRN CBG q4 hours Anticipate SVD  Lauren 05/10/2020, 9:36 PM    Attestation:  I confirm that I have verified the information documented in the  SNM 's note and that I have also personally reperformed the physical exam and all medical decision making activities.   The patient was seen and examined by me also Agree with note NST reactive and reassuring UCs as listed Cervical exams as listed in note  07/08/2020, CNM

## 2020-05-11 ENCOUNTER — Encounter (HOSPITAL_COMMUNITY): Payer: Self-pay | Admitting: Family Medicine

## 2020-05-11 ENCOUNTER — Other Ambulatory Visit: Payer: Self-pay

## 2020-05-11 ENCOUNTER — Inpatient Hospital Stay (HOSPITAL_COMMUNITY): Admission: AD | Admit: 2020-05-11 | Payer: Self-pay | Source: Home / Self Care | Admitting: Obstetrics and Gynecology

## 2020-05-11 ENCOUNTER — Ambulatory Visit: Payer: MEDICAID

## 2020-05-11 ENCOUNTER — Inpatient Hospital Stay (HOSPITAL_COMMUNITY): Payer: Self-pay

## 2020-05-11 DIAGNOSIS — O4202 Full-term premature rupture of membranes, onset of labor within 24 hours of rupture: Secondary | ICD-10-CM

## 2020-05-11 LAB — RPR: RPR Ser Ql: NONREACTIVE

## 2020-05-11 MED ORDER — ONDANSETRON HCL 4 MG/2ML IJ SOLN: 4.0000 mg | INTRAMUSCULAR | Status: DC | PRN

## 2020-05-11 MED ORDER — METFORMIN HCL 500 MG PO TABS
500.0000 mg | ORAL_TABLET | Freq: Two times a day (BID) | ORAL | Status: DC
  Administered 2020-05-11 – 2020-05-12 (×3): 500 mg via ORAL
  Filled 2020-05-11 (×4): qty 1

## 2020-05-11 MED ORDER — BENZOCAINE-MENTHOL 20-0.5 % EX AERO: 1.0000 "application " | INHALATION_SPRAY | CUTANEOUS | Status: DC | PRN

## 2020-05-11 MED ORDER — PRENATAL MULTIVITAMIN CH
1.0000 | ORAL_TABLET | Freq: Every day | ORAL | Status: DC
  Administered 2020-05-12 – 2020-05-13 (×2): 1 via ORAL
  Filled 2020-05-11 (×2): qty 1

## 2020-05-11 MED ORDER — DIBUCAINE (PERIANAL) 1 % EX OINT: 1.0000 "application " | TOPICAL_OINTMENT | CUTANEOUS | Status: DC | PRN

## 2020-05-11 MED ORDER — ONDANSETRON HCL 4 MG PO TABS: 4.0000 mg | ORAL_TABLET | ORAL | Status: DC | PRN

## 2020-05-11 MED ORDER — TETANUS-DIPHTH-ACELL PERTUSSIS 5-2.5-18.5 LF-MCG/0.5 IM SUSY: 0.5000 mL | PREFILLED_SYRINGE | Freq: Once | INTRAMUSCULAR | Status: DC

## 2020-05-11 MED ORDER — OXYTOCIN-SODIUM CHLORIDE 30-0.9 UT/500ML-% IV SOLN
1.0000 m[IU]/min | INTRAVENOUS | Status: DC
  Administered 2020-05-11: 2 m[IU]/min via INTRAVENOUS
  Filled 2020-05-11: qty 500

## 2020-05-11 MED ORDER — ACETAMINOPHEN 325 MG PO TABS: 650.0000 mg | ORAL_TABLET | ORAL | Status: DC | PRN

## 2020-05-11 MED ORDER — COCONUT OIL OIL
1.0000 "application " | TOPICAL_OIL | Status: DC | PRN
  Administered 2020-05-13: 1 via TOPICAL

## 2020-05-11 MED ORDER — SIMETHICONE 80 MG PO CHEW: 80.0000 mg | CHEWABLE_TABLET | ORAL | Status: DC | PRN

## 2020-05-11 MED ORDER — SENNOSIDES-DOCUSATE SODIUM 8.6-50 MG PO TABS
2.0000 | ORAL_TABLET | Freq: Every day | ORAL | Status: DC
  Administered 2020-05-12: 2 via ORAL
  Filled 2020-05-11 (×2): qty 2

## 2020-05-11 MED ORDER — IBUPROFEN 600 MG PO TABS
600.0000 mg | ORAL_TABLET | Freq: Four times a day (QID) | ORAL | Status: DC
  Administered 2020-05-11 – 2020-05-13 (×9): 600 mg via ORAL
  Filled 2020-05-11 (×9): qty 1

## 2020-05-11 MED ORDER — TERBUTALINE SULFATE 1 MG/ML IJ SOLN: 0.2500 mg | Freq: Once | INTRAMUSCULAR | Status: DC | PRN

## 2020-05-11 MED ORDER — DIPHENHYDRAMINE HCL 25 MG PO CAPS: 25.0000 mg | ORAL_CAPSULE | Freq: Four times a day (QID) | ORAL | Status: DC | PRN

## 2020-05-11 MED ORDER — WITCH HAZEL-GLYCERIN EX PADS: 1.0000 "application " | MEDICATED_PAD | CUTANEOUS | Status: DC | PRN

## 2020-05-11 NOTE — Progress Notes (Signed)
[name] Serita Grit is a 40 y.o. [id number] at [redacted]w[redacted]d by LMP admitted for active labor  Subjective:  Patient reports increase intensity with contractions since pitocin started. Does not desire epidural  Denies breathing difficulty, respiratory distress, chest pain, and leg swelling or pain.  Objective:  BP 113/79   Pulse 81   Temp 98.1 F (36.7 C) (Oral)   Resp 18   Ht 4\' 11"  (1.499 m)   Wt 76.7 kg   LMP 07/26/2019   SpO2 99%   BMI 34.13 kg/m  No intake/output data recorded. No intake/output data recorded.  FHT:  FHR: 130 bpm, variability: moderate,  accelerations:  Present,  decelerations:  Absent UC:   regular, every 1-3 minutes SVE:   Dilation: 4 Effacement (%): 70 Station: -2 Exam by:: H.Price, RN  Labs:  Lab Results  Component Value Date   WBC 9.4 05/10/2020   HGB 10.6 (L) 05/10/2020   HCT 32.9 (L) 05/10/2020   MCV 79.7 (L) 05/10/2020   PLT 319 05/10/2020    Assessment:  Augmentation of labor, progressing well   Latent phase of labor  SROM with Meconium   Plan:  Labor: Progressing on Pit., will increase as needed  Fetal Wellbeing:  Category I   Pain Control:  Labor support without medications  Anticipated MOD:  NSVD  Anticipatory guidance regarding course of labor  Offered pain medication and epidural, patient denied at this time.   07/08/2020  Juliann Pares 05/11/2020, 7:39 AM

## 2020-05-11 NOTE — Progress Notes (Signed)
Patient ID: [name], female   DOB: September 20, 1980, 40 y.o.   MRN: [id number] When IUPC was inserted, Labor was about 100 MvUs/67min  Now, after Pitocin starte, MVUs are about 230  Will continue to observe for increased progress in labor

## 2020-05-12 LAB — GLUCOSE, CAPILLARY: Glucose-Capillary: 135 mg/dL — ABNORMAL HIGH (ref 70–99)

## 2020-05-12 MED ORDER — FERROUS SULFATE 325 (65 FE) MG PO TABS
325.0000 mg | ORAL_TABLET | ORAL | Status: DC
  Administered 2020-05-12: 325 mg via ORAL
  Filled 2020-05-12: qty 1

## 2020-05-12 MED ORDER — VITAMIN C 250 MG PO TABS
250.0000 mg | ORAL_TABLET | ORAL | Status: DC
  Filled 2020-05-12: qty 1

## 2020-05-13 ENCOUNTER — Encounter: Payer: Self-pay | Admitting: Family Medicine

## 2020-05-13 DIAGNOSIS — Z8759 Personal history of other complications of pregnancy, childbirth and the puerperium: Secondary | ICD-10-CM

## 2020-05-13 DIAGNOSIS — O2663 Liver and biliary tract disorders in the puerperium: Secondary | ICD-10-CM

## 2020-05-13 DIAGNOSIS — Z603 Acculturation difficulty: Secondary | ICD-10-CM

## 2020-05-13 LAB — GLUCOSE, CAPILLARY
Glucose-Capillary: 67 mg/dL — ABNORMAL LOW (ref 70–99)
Glucose-Capillary: 90 mg/dL (ref 70–99)

## 2020-05-13 MED ORDER — ASCORBIC ACID 250 MG PO TABS: 250.0000 mg | ORAL_TABLET | ORAL | Status: DC

## 2020-05-13 MED ORDER — COCONUT OIL OIL: 1.0000 "application " | TOPICAL_OIL | 0 refills | Status: DC | PRN

## 2020-05-13 MED ORDER — ACETAMINOPHEN 500 MG PO TABS: 1000.0000 mg | ORAL_TABLET | Freq: Four times a day (QID) | ORAL | Status: DC | PRN

## 2020-05-13 MED ORDER — IBUPROFEN 600 MG PO TABS: 600.0000 mg | ORAL_TABLET | Freq: Four times a day (QID) | ORAL | 0 refills | Status: DC | PRN

## 2020-05-13 MED ORDER — FERROUS SULFATE 325 (65 FE) MG PO TABS: 325.0000 mg | ORAL_TABLET | ORAL | 3 refills | Status: DC

## 2020-05-13 NOTE — Progress Notes (Signed)
Hypoglycemic Event  CBG: 67  Treatment: 4 oz juice  Symptoms: none  Follow-up CBG: Time:0859 CBG Result:90  Possible Reasons for Event: fasting  Comments/MD notified:    Jonne Rote

## 2020-05-13 NOTE — Discharge Instructions (Signed)
-tomar tylenol 1000 mg cada 6 horas segn sea necesario para el dolor, alternar con ibuprofeno 600 mg cada 6 horas -beber mucha agua para ayudar con la lactancia materna -continuar con las vitaminas prenatales mientras est amamantando -tomar pastillas de hierro [name] con vitamina C, esto ayudar a la curacin, as como a la lactancia materna -piense en las opciones de control de la natalidad- >bedisider.org es un gran sitio web! Puede obtener cualquier forma de control de la natalidad del departamento de salud de forma gratuita  Parto vaginal, cuidados de puerperio Postpartum Care After Vaginal Delivery La siguiente informacin ofrece una gua sobre cmo cuidarse desde el momento en que nazca su beb y [name] 6 a 12 semanas despus del parto (perodo del posparto). Si tiene problemas o preguntas, pngase en contacto con su mdico para obtener instrucciones ms especficas. Siga estas instrucciones en su casa: Hemorragia vaginal  Es normal tener un poco de hemorragia vaginal (loquios) despus del parto. Use un apsito sanitario para el sangrado y secrecin. ? Durante la primera semana despus del parto, la cantidad y el aspecto de los loquios a menudo es similar a los del perodo menstrual. ? Durante las siguientes semanas disminuir gradualmente hasta convertirse en una secrecin seca amarronada o [name]. ? En la [name], los loquios se detienen [employer] 4 a 6semanas despus del parto, pero esto puede variar.  Cambie los apsitos sanitarios con frecuencia. Observe si hay cambios en el flujo, como: ? Aumento repentino en el volumen. ? Cambio en el color. ? Cogulos de [name].  Si expulsa un cogulo de sangre por la vagina, gurdelo y llame al mdico. No deseche los cogulos de sangre por el inodoro antes de hablar con su mdico.  No use tampones ni se haga duchas vaginales hasta que el mdico la autorice.  Si no est amamantando, volver a  tener su perodo entre 6 y 8 semanas despus del parto. Si solamente alimenta al beb con [name], podra no volver a tener su perodo hasta que deje de [place]. Cuidados perineales  Mantenga la zona entre la vagina y el ano (perineo) limpia y Magazine features editor. Utilice apsitos o [employer] y Control and instrumentation engineer, como se lo hayan indicado.  Si le hicieron un corte quirrgico en el perineo (episiotoma) o tuvo un desgarro, controle la zona para detectar signos de infeccin hasta que sane. Est atenta a los siguientes signos: ? Aumento del enrojecimiento, la hinchazn o Chief Technology Officer. ? Presenta lquido o sangre que supura del corte o Insurance account manager. ? Calor. ? Pus o mal olor.  Es posible que le den una botella rociadora para que use en lugar de limpiarse el rea con papel higinico despus de usar el bao. Seque la zona dando golpecitos suaves.  Para aliviar el dolor causado por una episiotoma, un desgarro o venas hinchadas en el ano (hemorroides), tome un bao de asiento tibio 2 o 3 veces por da. En un bao de asiento, el agua tibia solamente debe llegar [employer] caderas y [employer].   Cuidado de las [address]  En los [address] despus del parto, las mamas pueden sentirse pesadas, llenas e incmodas (congestin [place]). Tambin puede escaparse leche de sus senos. Pdale al mdico que le sugiera formas de Acupuncturist.  Si est amamantando: ? Use un sostn que sujete las mamas y [place]. Utilice protectores mamarios para Insurance account manager [employer] se filtre. ? Mantenga los pezones secos y limpios. Aplique cremas y ungentos  como se lo hayan indicado. ? Puede tener contracciones uterinas cada vez que amamante durante varias semanas despus del [place]. Esto ayuda a que el tero vuelva a su tamao normal. ? Si tiene algn problema con la lactancia materna, infrmelo al mdico o a un Holiday representativeasesor en lactancia.  Si no est amamantando: ? Evite tocarse las mamas. No extraiga (saque) Colgate Palmoliveleche materna. Al  hacerlo, podran producir ms leche. ? Use un sostn que le proporcione el ajuste correcto y compresas fras para reducir la hinchazn. Intimidad y sexualidad  Pregntele al mdico cundo puede retomar la actividad sexual. Esto puede depender de lo siguiente: ? Su riesgo de sufrir infecciones. ? La rapidez con la que est sanando. ? Su comodidad y deseo de retomar la actividad sexual.  Despus del parto, puede quedar embarazada incluso si no ha tenido todava su perodo. Hable con el mdico acerca de los mtodos de control de la natalidad (mtodos anticonceptivos) o planificacin familiar si desea Social research officer, governmenttener embarazos en el futuro. Medicamentos  Use los medicamentos de venta libre y los recetados solamente como se lo haya indicado el mdico.  Tome un laxante de venta libre para ayudar con las deposiciones como se lo haya indicado el mdico.  Si le recetaron un antibitico, tmelo como se lo haya indicado el mdico. No deje de tomar el antibitico aunque comience a sentirse mejor.  Revise todos los medicamentos con Engineer, civil (consulting)receta anteriores y actuales para comprobar la posible transferencia a la [place] materna. Actividad  Retome sus actividades normales de a poco segn lo indicado por el mdico.  Descanse todo lo que pueda. Tome siestas mientras el beb duerme. Comida y bebida  Beba suficiente lquido como para Pharmacologistmantener la orina de color amarillo plido.  Para ayudar a prevenir o Educational psychologistaliviar el estreimiento, coma alimentos ricos en Rohm and Haasfibra todos los das.  Elija una alimentacin saludable para ayudar a la lactancia o los objetivos de prdida de [place].  Tome sus vitaminas prenatales hasta que su mdico le indique que deje de [place].   Recomendaciones/consejos generales  No consuma ningn producto que contenga nicotina o tabaco. Estos productos incluyen cigarrillos, tabaco para Theatre managermascar y aparatos de vapeo, como los Administrator, Civil Servicecigarrillos electrnicos. Si necesita ayuda para dejar de fumar, consulte al mdico.  No  beba alcohol, especialmente si est amamantando.  No tome medicamentos o frmacos que no se le receten, especialmente si est en perodo de lactancia.  Visite al mdico para un control de posparto dentro de las primeras 3 a 6 semanas despus del [place].  Realice una visita posparto integral a ms tardar 12 semanas despus del parto.  Asista a todas las visitas de seguimiento para usted y su beb. Comunquese con un mdico si:  Siente tristeza o preocupacin de forma inusual.  Las mamas se ponen rojas, le duelen o se endurecen.  Tiene fiebre u otros signos de infeccin.  Tiene sangrado que est empapando una compresa por hora o tiene cogulos de [place].  Siente un dolor de cabeza intenso que no se [place]ivia o tiene cambios en la visin.  Tiene nuseas y vmitos y no puede comer o beber nada durante 24horas. Solicite ayuda de inmediato si:  Tiene dolor en el pecho o dificultad para respirar.  Tiene un dolor repentino e intenso en la pierna.  Tiene una convulsin o se desmaya.  Tiene pensamientos acerca de lastimarse a usted misma o a su beb. Si alguna vez siente que puede lastimarse o Physicist, medicallastimar a Economistotras personas, o tiene pensamientos de poner fin a  su vida, busque ayuda de inmediato. Dirjase al servicio de urgencias ms cercano o:  Comunquese con el servicio de emergencias de su localidad (911 en los [place]).  National Suicide Prevention Lifeline (Lnea Telefnica Nacional para la Prevencin del Suicidio) al 902-470-3457. Esta lnea de asistencia al suicida est abierta las 24 horas del da.  Enve un mensaje de texto a la lnea para casos de crisis al [phone] (en los EE.UU.). Resumen  El perodo de tiempo despus el parto y [name] 6 a 12 semanas despus del parto se denomina perodo posparto.  Asista a todas las visitas de seguimiento para usted y su beb.  Revise todos los medicamentos con Engineer, civil (consulting) y actuales para comprobar la posible transferencia a la  [place].  Pngase en contacto con un mdico si se siente inusualmente triste o preocupada durante el perodo posparto. Esta informacin no tiene Theme park manager el consejo del mdico. Asegrese de hacerle al mdico cualquier pregunta que tenga. Document Revised: 01/28/2020 Document Reviewed: 01/05/2020 Elsevier Patient Education  2021 Elsevier Inc.       -take tylenol 1000 mg every 6 hours as needed for pain, alternate with ibuprofen 600 mg every 6 hours -drink plenty of water to help with breastfeeding -continue prenatal vitamins while you are breastfeeding -take iron pills every other day with vitamin c, this will help healing as well as breast feeding -think about birth control options-->bedisider.org is a great website! You can get any form of birth control from the health department for free   Postpartum Care After Vaginal Delivery The following information offers guidance about how to care for yourself from the time you deliver your baby to 6-12 weeks after delivery (postpartum period). If you have problems or questions, contact your health care provider for more specific instructions. Follow these instructions at home: Vaginal bleeding  It is normal to have vaginal bleeding (lochia) after delivery. Wear a sanitary pad for bleeding and discharge. ? During the first week after delivery, the amount and appearance of lochia is often similar to a menstrual period. ? Over the next few weeks, it will gradually decrease to a dry, yellow-brown discharge. ? For most women, lochia stops completely by 4-6 weeks after delivery, but can vary.  Change your sanitary pads frequently. Watch for any changes in your flow, such as: ? A sudden increase in volume. ? A change in color. ? Large blood clots.  If you pass a blood clot from your vagina, save it and call your health care provider. Do not flush blood clots down the toilet before talking with your health care provider.  Do not  use tampons or douches until your health care provider approves.  If you are not breastfeeding, your period should return 6-8 weeks after delivery. If you are feeding your baby breast milk only, your period may not return until you stop breastfeeding. Perineal care  Keep the area between the vagina and the anus (perineum) clean and dry. Use medicated pads and pain-relieving sprays and creams as directed.  If you had a surgical cut in the perineum (episiotomy) or a tear, check the area for signs of infection until you are healed. Check for: ? More redness, swelling, or pain. ? Fluid or blood coming from the cut or tear. ? Warmth. ? Pus or a bad smell.  You may be given a squirt bottle to use instead of wiping to clean the perineum area after you use the bathroom. Pat the area gently to dry  it.  To relieve pain caused by an episiotomy, a tear, or swollen veins in the anus (hemorrhoids), take a warm sitz bath 2-3 times a day. In a sitz bath, the warm water should only come up to your hips and cover your buttocks.   Breast care  In the first few days after delivery, your breasts may feel heavy, full, and uncomfortable (breast engorgement). Milk may also leak from your breasts. Ask your health care provider about ways to help relieve the discomfort.  If you are breastfeeding: ? Wear a bra that supports your breasts and fits well. Use breast pads to absorb milk that leaks. ? Keep your nipples clean and dry. Apply creams and ointments as told. ? You may have uterine contractions every time you breastfeed for up to several weeks after delivery. This helps your uterus return to its normal size. ? If you have any problems with breastfeeding, notify your health care provider or lactation consultant.  If you are not breastfeeding: ? Avoid touching your breasts. Do not squeeze out (express) milk. Doing this can make your breasts produce more milk. ? Wear a good-fitting bra and use cold packs to help  with swelling. Intimacy and sexuality  Ask your health care provider when you can engage in sexual activity. This may depend upon: ? Your risk of infection. ? How fast you are healing. ? Your comfort and desire to engage in sexual activity.  You are able to get pregnant after delivery, even if you have not had your period. Talk with your health care provider about methods of birth control (contraception) or family planning if you desire future pregnancies. Medicines  Take over-the-counter and prescription medicines only as told by your health care provider.  Take an over-the-counter stool softener to help ease bowel movements as told by your health care provider.  If you were prescribed an antibiotic medicine, take it as told by your health care provider. Do not stop taking the antibiotic even if you start to feel better.  Review all previous and current prescriptions to check for possible transfer into breast milk. Activity  Gradually return to your normal activities as told by your health care provider.  Rest as much as possible. Nap while your baby is sleeping. Eating and drinking  Drink enough fluid to keep your urine pale yellow.  To help prevent or relieve constipation, eat high-fiber foods every day.  Choose healthy eating to support breastfeeding or weight loss goals.  Take your prenatal vitamins until your health care provider tells you to stop.   General tips/recommendations  Do not use any products that contain nicotine or tobacco. These products include cigarettes, chewing tobacco, and vaping devices, such as e-cigarettes. If you need help quitting, ask your health care provider.  Do not drink alcohol, especially if you are breastfeeding.  Do not take medications or drugs that are not prescribed to you, especially if you are breastfeeding.  Visit your health care provider for a postpartum checkup within the first 3-6 weeks after delivery.  Complete a  comprehensive postpartum visit no later than 12 weeks after delivery.  Keep all follow-up visits for you and your baby. Contact a health care provider if:  You feel unusually sad or worried.  Your breasts become red, painful, or hard.  You have a fever or other signs of an infection.  You have bleeding that is soaking through one pad an hour or you have blood clots.  You have a severe headache that  doesn't go away or you have vision changes.  You have nausea and vomiting and are unable to eat or drink anything for 24 hours. Get help right away if:  You have chest pain or difficulty breathing.  You have sudden, severe leg pain.  You faint or have a seizure.  You have thoughts about hurting yourself or your baby. If you ever feel like you may hurt yourself or others, or have thoughts about taking your own life, get help right away. Go to your nearest emergency department or:  Call your local emergency services (911 in the [place]  The National Suicide Prevention Lifeline at 530-616-2438. This suicide crisis helpline is open 24 hours a day.  Text the Crisis Text Line at 6577101824 (in the [place].). Summary  The period of time after you deliver your newborn up to 6-12 weeks after delivery is called the postpartum period.  Keep all follow-up visits for you and your baby.  Review all previous and current prescriptions to check for possible transfer into breast milk.  Contact a health care provider if you feel unusually sad or worried during the postpartum period. This information is not intended to replace advice given to you by your health care provider. Make sure you discuss any questions you have with your health care provider. Document Revised: 12/10/2019 Document Reviewed: 12/10/2019 Elsevier Patient Education  2021 [employer].

## 2020-05-13 NOTE — Discharge Summary (Signed)
   Postpartum Discharge Summary  Date of Service updated 05/13/20     Patient Name: [name] DOB: 01/23/1981 MRN: [id number]  Date of admission: 05/10/2020 Delivery date:05/11/2020  Delivering provider: WALLACE, CATHERINE LAUREN  Date of discharge: 05/13/2020  Admitting diagnosis: Cholestasis during pregnancy [O26.619, K83.1] Intrauterine pregnancy: [redacted]w[redacted]d     Secondary diagnosis:  Active Problems:   History of stillbirth   Vaginal delivery   Supervision of other normal pregnancy, antepartum   Language barrier   Gestational diabetes   Cholestasis during pregnancy  Additional problems: none    Discharge diagnosis: Term Pregnancy Delivered, GDM A2 and Anemia                                              Post partum procedures:none Augmentation: Pitocin Complications: None  Hospital course: Onset of Labor With Vaginal Delivery      40 y.o. yo [id number] at [redacted]w[redacted]d was admitted in Latent Labor on 05/10/2020. Patient had an uncomplicated labor course as follows:  Membrane Rupture Time/Date: 11:10 PM ,05/10/2020   Delivery Method:Vaginal, Spontaneous  Episiotomy: None  Lacerations:  1st degree;Periurethral;Perineal  Patient had an uncomplicated postpartum course.  She is ambulating, tolerating a regular diet, passing flatus, and urinating well. Patient is discharged home in stable condition on 05/13/20.  Newborn Data: Birth date:05/11/2020  Birth time:11:02 AM  Gender:Female  Living status:Living  Apgars:8 ,8  Weight:2880 g   Magnesium Sulfate received: No BMZ received: No Rhophylac:N/A MMR:N/A T-DaP:no Flu: No Transfusion:No  Physical exam  Vitals:   05/11/20 2239 05/12/20 0233 05/12/20 2230 05/13/20 0530  BP: 107/68 107/63 110/64 (!) 95/56  Pulse: 93 90 75 80  Resp: 17 18 18 18  Temp: 98.2 F (36.8 C) 98.1 F (36.7 C) 97.8 F (36.6 C) 98.4 F (36.9 C)  TempSrc: Oral Oral Oral Oral  SpO2: 97% 97%  99%  Weight:      Height:       General: alert, cooperative and  no distress Lochia: appropriate Uterine Fundus: firm Incision: N/A DVT Evaluation: No evidence of DVT seen on physical exam. Labs: Lab Results  Component Value Date   WBC 9.4 05/10/2020   HGB 10.6 (L) 05/10/2020   HCT 32.9 (L) 05/10/2020   MCV 79.7 (L) 05/10/2020   PLT 319 05/10/2020   CMP Latest Ref Rng & Units 06/15/2016  Glucose 65 - 99 mg/dL 76  BUN 7 - 25 mg/dL 10  Creatinine 0.50 - 1.10 mg/dL 0.86  Sodium 135 - 146 mmol/L 138  Potassium 3.5 - 5.3 mmol/L 4.0  Chloride 98 - 110 mmol/L 104  CO2 20 - 31 mmol/L 24  Calcium 8.6 - 10.2 mg/dL 9.3  Total Protein 6.1 - 8.1 g/dL 7.7  Total Bilirubin 0.2 - 1.2 mg/dL 0.3  Alkaline Phos 33 - 115 U/L 94  AST 10 - 30 U/L 17  ALT 6 - 29 U/L 16   Edinburgh Score: Edinburgh Postnatal Depression Scale Screening Tool 05/11/2020  I have been able to laugh and see the funny side of things. 0  I have looked forward with enjoyment to things. 0  I have blamed myself unnecessarily when things went wrong. 0  I have been anxious or worried for no good reason. 0  I have felt scared or panicky for no good reason. 1  Things have been getting on top of me.   0  I have been so unhappy that I have had difficulty sleeping. 0  I have felt sad or miserable. 0  I have been so unhappy that I have been crying. 0  The thought of harming myself has occurred to me. 0  Edinburgh Postnatal Depression Scale Total 1     After visit meds:  Allergies as of 05/13/2020   No Known Allergies     Medication List    STOP taking these medications   aspirin 81 MG chewable tablet   metFORMIN 500 MG tablet Commonly known as: Glucophage   ursodiol 500 MG tablet Commonly known as: ACTIGALL     TAKE these medications   acetaminophen 500 MG tablet Commonly known as: TYLENOL Take 2 tablets (1,000 mg total) by mouth every 6 (six) hours as needed (for pain scale < 4).   ascorbic acid 250 MG tablet Commonly known as: VITAMIN C Take 1 tablet (250 mg total) by mouth  every other day.   Blood Pressure Kit Devi 1 kit by Does not apply route as needed.   coconut oil Oil Apply 1 application topically as needed.   ferrous sulfate 325 (65 FE) MG tablet Take 1 tablet (325 mg total) by mouth every other day. Start taking on: May 14, 2020   ibuprofen 600 MG tablet Commonly known as: ADVIL Take 1 tablet (600 mg total) by mouth every 6 (six) hours as needed.   multivitamin-prenatal 27-0.8 MG Tabs tablet Take 1 tablet by mouth daily at 12 noon.        Discharge home in stable condition Infant Feeding: Bottle and Breast Infant Disposition:home with mother Discharge instruction: per After Visit Summary and Postpartum booklet. Activity: Advance as tolerated. Pelvic rest for 6 weeks.  Diet: routine diet Future Appointments:No future appointments. Follow up Visit:  Message sent to [facility] 2.4.22 by [facility].    Please schedule this patient for a In person postpartum visit in 4 weeks with the following provider: Any provider. Additional Postpartum F/U:2 hour GTT  High risk pregnancy complicated by: GDM Delivery mode:  Vaginal, Spontaneous  Anticipated Birth Control:  IUD @[facility]   05/13/2020 Alicia C Firestone, MD   

## 2020-05-17 NOTE — Progress Notes (Signed)
Late entry:  On 05/12/20 the patient was not seen/rounded on due to a team miscommunication.  Her care was not impacted and she was discharged on 05/13/20 with no issues.   Mariel Aloe, MD Faculty attending, [facility]

## 2020-05-18 ENCOUNTER — Ambulatory Visit: Payer: MEDICAID

## 2020-06-22 ENCOUNTER — Ambulatory Visit (INDEPENDENT_AMBULATORY_CARE_PROVIDER_SITE_OTHER): Payer: Self-pay | Admitting: Obstetrics & Gynecology

## 2020-06-22 ENCOUNTER — Other Ambulatory Visit: Payer: Self-pay

## 2020-06-22 VITALS — BP 117/82 | HR 94 | Wt 156.0 lb

## 2020-06-22 DIAGNOSIS — Z8632 Personal history of gestational diabetes: Secondary | ICD-10-CM

## 2020-06-22 DIAGNOSIS — Z789 Other specified health status: Secondary | ICD-10-CM

## 2020-06-22 NOTE — Patient Instructions (Signed)
Diabetes mellitus gestacional, diagnstico Gestational Diabetes Mellitus, Diagnosis La diabetes mellitus gestacional es una forma de diabetes. Puede manifestarse mientras est embarazada. La diabetes desaparece despus de dar a luz. Si no recibe tratamiento, esta afeccin puede causarles problemas a usted y a su beb. Cules son las causas? La causa de esta afeccin son cambios que ocurren en su cuerpo cuando est embarazada. Cuando esos cambios suceden:  Un rgano del cuerpo llamado pncreas no produce suficiente insulina.  El cuerpo no puede utilizar la insulina de la manera correcta. El azcar no puede entrar en las clulas del cuerpo. El azcar permanece en la sangre. Esto provoca un nivel alto de azcar en la sangre.   Qu incrementa el riesgo?  Ser mayor de 25 aos durante el embarazo.  Tener a alguien con diabetes en la familia.  Exceso de peso corporal.  Haber tenido esta afeccin antes.  Sndrome del ovario poliqustico.  Estar embarazada de ms de un beb. Cules son los signos o sntomas?  Tener sed con frecuencia.  Tener hambre con frecuencia.  Tener necesidad de orinar con mayor frecuencia. Cmo se trata?  Siga una dieta saludable.  Haga ms ejercicio.  Controle su nivel de azcar en la sangre con frecuencia.  Aplquese insulina o tome otros medicamentos, si es necesario.  Trabaje con un experto en esta afeccin, si se lo recomiendan. Siga estas instrucciones en su casa: Infrmese sobre la diabetes Pregntele al mdico lo siguiente:  Con qu frecuencia debo controlarme el azcar en la sangre? Dnde obtengo el equipo?  Qu medicamentos necesito? Cundo debo tomarlos?  Es necesario que me rena con un instructor?  A quin puedo llamar si tengo preguntas?  Dnde puedo encontrar un grupo de apoyo? Indicaciones generales  Use los medicamentos solamente como se lo haya indicado el mdico.  Mantenga un peso saludable.  Beba suficiente  lquido como para mantener la orina de color amarillo plido.  Use una brazalete de alerta o lleve una tarjeta que muestre que tiene esta afeccin.  Cumpla con todas las visitas de seguimiento. Dnde buscar ms informacin  American Diabetes Association (ADA) (Asociacin Estadounidense de la Diabetes): diabetes.org  Association of Diabetes Care & Education Specialists (ADCES) (Asociacin de Especialistas en Atencin y Educacin sobre la Diabetes): diabeteseducator.org  Centers for Disease Control and Prevention (Centros para el Control y la Prevencin de Enfermedades, CDC): cdc.gov  American Pregnancy Association (Asociacin Americana del Embarazo): americanpregnancy.org  U.S. Department of Agriculture MyPlate (MyPlate del Departamento de Agricultura de los [place].UU.): myplate.gov Comunquese con un mdico si:  Su nivel de azcar en la sangre es igual o mayor que 240mg/dl (13.3mmol/dl).  Su nivel de azcar en la sangre es igual o mayor que 200mg/dl (11.1mmol/l), y tiene cetonas en la orina.  Tiene fiebre.  Ha estado enferma durante 2 o ms das y no mejora.  Tiene cualquiera de estos problemas durante ms de 6horas: ? Vomita cada vez que come o bebe. ? Presenta heces lquidas (diarrea). Solicite ayuda de inmediato si:  No puede pensar con claridad.  No respira bien.  Tiene mucha cantidad de cetonas en la orina.  El beb parece moverse menos de lo normal.  Le comienza a salir lquido o sangre anmalos de la vagina.  Comienza a tener contracciones antes de la fecha de parto. Siente que el vientre se endurece.  Tiene un dolor de cabeza muy intenso. Estos sntomas pueden indicar una emergencia. Solicite ayuda de inmediato. Comunquese con el servicio de emergencias de su localidad (911 en   los [place]).  No espere a ver si los sntomas desaparecen.  No conduzca por sus propios medios hasta el hospital. Resumen  La diabetes gestacional es una forma de diabetes.  Puede manifestarse mientras est embarazada.  Esta afeccin ocurre cuando el cuerpo no puede producir insulina o no puede utilizarla de la manera correcta.  Siga una dieta saludable, haga actividad fsica y use los medicamentos o la insulina como se lo haya indicado el mdico.  Informe a su mdico si su nivel de azcar en la sangre es alto, tiene fiebre o vomita cada vez que come o bebe.  Obtenga ayuda de inmediato si no puede pensar con claridad, no respira bien o su beb parece moverse menos de lo normal. Esta informacin no tiene como fin reemplazar el consejo del mdico. Asegrese de hacerle al mdico cualquier pregunta que tenga. Document Revised: 10/27/2019 Document Reviewed: 10/27/2019 Elsevier Patient Education  2021 Elsevier Inc.  

## 2020-06-22 NOTE — Progress Notes (Signed)
..     Post Partum Visit Note  [name] is a 40 y.o. [phone] female who presents for a postpartum visit. She is 6 weeks postpartum following a normal spontaneous vaginal delivery.  I have fully reviewed the prenatal and intrapartum course. The delivery was at 37.0 gestational weeks.  Anesthesia: none. Postpartum course has been goos. Baby is doing well. Baby is feeding by both breast and bottle - Gerber. Bleeding no bleeding. Bowel function is normal. Bladder function is normal. Patient is not sexually active. Contraception method is none. Postpartum depression screening: negative.   The pregnancy intention screening data noted above was reviewed. Potential methods of contraception were discussed. The patient elected to proceed with No Method - Other Reason.      The following portions of the patient's history were reviewed and updated as appropriate: allergies, current medications, past family history, past medical history, past social history, past surgical history and problem list.  Review of Systems Pertinent items are noted in HPI.    Objective:  LMP 07/26/2019    General:  alert, cooperative and no distress     Lungs: effort normal  Heart:  normal rate  Abdomen: soft, non-tender; bowel sounds normal; no masses,  no organomegaly   Vulva:  not evaluated  Vagina: not evaluated                    Assessment:    normal postpartum exam. Pap smear not done at today's visit.   Plan:   Essential components of care per ACOG recommendations:  1.  Mood and well being: Patient with negative depression screening today. Reviewed local resources for support.  - Patient does not use tobacco.  - hx of drug use? No    2. Infant care and feeding:  -Patient currently breastmilk feeding? Yes If breastmilk feeding discussed return to work and pumping. If needed, patient was provided letter for work to allow for every 2-3 hr pumping breaks, and to be granted a private location  to express breastmilk and refrigerated area to store breastmilk. Reviewed importance of draining breast regularly to support lactation. -Social determinants of health (SDOH) reviewed in EPIC. No concerns  3. Sexuality, contraception and birth spacing - Patient does not want a pregnancy in the next year.  Desired family size is 5 children.  - Reviewed forms of contraception in tiered fashion. Patient desired condoms today.   - Discussed birth spacing of 18 months  4. Sleep and fatigue -Encouraged family/partner/community support of 4 hrs of uninterrupted sleep to help with mood and fatigue  5. Physical Recovery  - Discussed patients delivery - Patient has urinary incontinence? No - Patient is safe to resume physical and sexual activity  6.  Health Maintenance - Last pap smear done 11/2019 and was normal with negative HPV.   7. Chronic Disease 2 hr GTT  Katrina Stack, RN [facility] [employer], [facility]

## 2020-06-23 LAB — GLUCOSE TOLERANCE, 2 HOURS
Glucose, 2 hour: 141 mg/dL — ABNORMAL HIGH (ref 65–139)
Glucose, GTT - Fasting: 85 mg/dL (ref 65–99)

## 2020-07-01 NOTE — Progress Notes (Signed)
Notify patient that 2 hr GTT was abnormal and she should follow with her PCP

## 2021-07-15 ENCOUNTER — Encounter (HOSPITAL_COMMUNITY): Payer: Self-pay | Admitting: Emergency Medicine

## 2021-07-15 ENCOUNTER — Ambulatory Visit (HOSPITAL_COMMUNITY)
Admission: EM | Admit: 2021-07-15 | Discharge: 2021-07-15 | Disposition: A | Discharge status: Home / Self Care | Payer: Self-pay | Attending: Nurse Practitioner | Admitting: Nurse Practitioner

## 2021-07-15 DIAGNOSIS — H109 Unspecified conjunctivitis: Secondary | ICD-10-CM

## 2021-07-15 DIAGNOSIS — B9689 Other specified bacterial agents as the cause of diseases classified elsewhere: Secondary | ICD-10-CM

## 2021-07-15 MED ORDER — ERYTHROMYCIN 5 MG/GM OP OINT: TOPICAL_OINTMENT | Freq: Four times a day (QID) | OPHTHALMIC | 0 refills | Status: AC

## 2021-07-15 NOTE — Discharge Instructions (Addendum)
-   Please apply the eye ointment 4 times daily for 7 days ?-If your symptoms persist or worsen, please seek care ?

## 2021-07-15 NOTE — ED Triage Notes (Signed)
Pt is present today with bilateral eye irritation pain. Pt states that her irritation started on Tuesday.  ?

## 2021-07-15 NOTE — ED Provider Notes (Signed)
?[place] ? ? ? ?CSN: [id number] ?Arrival date & time: 07/15/21  1453 ? ? ?  ? ?History   ?Chief Complaint ?Chief Complaint  ?Patient presents with  ? Eye Pain  ? ? ?HPI ?[name] is a 41 y.o. female.  ? ?Patient presents with son and medical interpreter.  Reports eye itching, redness and drainage has been persistent since Tuesday.  She denies headache, significant photophobia, floaters in her vision.  She does not wear contacts. Denies foreign body.  She is dabbing her eyes frequently during the visit today.  No known sick contacts.  No congestion, fevers, cough, nasal itchiness, sore throat.  ? ? ?Past Medical History:  ?Diagnosis Date  ? Gestational diabetes   ? Medical history non-contributory   ? ? ?Patient Active Problem List  ? Diagnosis Date Noted  ? Language barrier 11/11/2019  ? ? ?Past Surgical History:  ?Procedure Laterality Date  ? APPENDECTOMY    ? CHOLECYSTECTOMY  12/15/2014  ? Procedure: LAPAROSCOPIC CHOLECYSTECTOMY, attempted cholangiogram;  Surgeon: Georganna Skeans, MD;  Location: [place];  Service: General;;  ? LAPAROSCOPIC APPENDECTOMY  04/05/2012  ? Procedure: APPENDECTOMY LAPAROSCOPIC;  Surgeon: Madilyn Hook, DO;  Location: [facility];  Service: General;  Laterality: N/A;  ? ? ?OB History   ? ? Gravida  ?7  ? Para  ?6  ? Term  ?4  ? Preterm  ?2  ? AB  ?1  ? Living  ?5  ?  ? ? SAB  ?1  ? IAB  ?0  ? Ectopic  ?0  ? Multiple  ?0  ? Live Births  ?5  ?   ?  ?  ? ? ? ?Home Medications   ? ?Prior to Admission medications   ?Medication Sig Start Date End Date Taking? Authorizing Provider  ?erythromycin ophthalmic ointment Place into both eyes 4 (four) times daily for 7 days. Place a 1/2 inch ribbon of ointment into the lower eyelid. 07/15/21 07/22/21 Yes Eulogio Bear, NP  ?acetaminophen (TYLENOL) 500 MG tablet Take 2 tablets (1,000 mg total) by mouth every 6 (six) hours as needed (for pain scale < 4). ?Patient not taking: Reported on 06/22/2020 08/12/41   Arrie Senate, MD  ?Blood  Pressure Monitoring (BLOOD PRESSURE KIT) DEVI 1 kit by Does not apply route as needed. ?Patient not taking: Reported on 06/22/2020 11/04/19   Constant, Peggy, MD  ?coconut oil OIL Apply 1 application topically as needed. ?Patient not taking: Reported on 06/22/2020 06/08/93   Arrie Senate, MD  ?ferrous sulfate 325 (65 FE) MG tablet Take 1 tablet (325 mg total) by mouth every other day. ?Patient not taking: Reported on 06/22/2020 04/16/82   Arrie Senate, MD  ?ibuprofen (ADVIL) 600 MG tablet Take 1 tablet (600 mg total) by mouth every 6 (six) hours as needed. ?Patient not taking: Reported on 06/22/2020 04/14/58   Arrie Senate, MD  ?Prenatal Vit-Fe Fumarate-FA (MULTIVITAMIN-PRENATAL) 27-0.8 MG TABS tablet Take 1 tablet by mouth daily at 12 noon. ?Patient not taking: Reported on 06/22/2020    [provider]  ?vitamin C (VITAMIN C) 250 MG tablet Take 1 tablet (250 mg total) by mouth every other day. ?Patient not taking: Reported on 06/22/2020 09/10/99   Arrie Senate, MD  ? ? ?Family History ?Family History  ?Problem Relation Age of Onset  ? Stroke Neg Hx   ? Hypertension Neg Hx   ? Diabetes Neg Hx   ? ? ?Social History ?Social History  ? ?  Tobacco Use  ? Smoking status: Never  ? Smokeless tobacco: Never  ?Vaping Use  ? Vaping Use: Never used  ?Substance Use Topics  ? Alcohol use: No  ? Drug use: No  ? ? ? ?Allergies   ?Patient has no known allergies. ? ? ?Review of Systems ?Review of Systems ?Per HPI ? ?Physical Exam ?Triage Vital Signs ?ED Triage Vitals  ?Enc Vitals Group  ?   BP 07/15/21 1613 123/84  ?   Pulse Rate 07/15/21 1613 100  ?   Resp 07/15/21 1613 17  ?   Temp 07/15/21 1613 97.7 ?F (36.5 ?C)  ?   Temp src --   ?   SpO2 07/15/21 1613 98 %  ?   Weight --   ?   Height --   ?   Head Circumference --   ?   Peak Flow --   ?   Pain Score 07/15/21 1615 5  ?   Pain Loc --   ?   Pain Edu? --   ?   Excl. in [place]? --   ? ?No data found. ? ?Updated Vital Signs ?BP 123/84   Pulse 100   Temp 97.7 ?F  (36.5 ?C)   Resp 17   SpO2 98%   Breastfeeding No  ? ?Visual Acuity ?Right Eye Distance:   ?Left Eye Distance:   ?Bilateral Distance:   ? ?Right Eye Near:   ?Left Eye Near:    ?Bilateral Near:    ? ?Physical Exam ?Vitals and nursing note reviewed.  ?Constitutional:   ?   General: She is not in acute distress. ?   Appearance: Normal appearance. She is not toxic-appearing.  ?Eyes:  ?   General:     ?   Right eye: Discharge present.     ?   Left eye: Discharge present. ?   Extraocular Movements: Extraocular movements intact.  ?   Right eye: Normal extraocular motion.  ?   Conjunctiva/sclera:  ?   Right eye: Right conjunctiva is injected. Exudate present. No hemorrhage. ?   Left eye: Left conjunctiva is injected. Exudate present. No hemorrhage. ?Cardiovascular:  ?   Rate and Rhythm: Normal rate and regular rhythm.  ?Pulmonary:  ?   Effort: Pulmonary effort is normal. No respiratory distress.  ?Musculoskeletal:  ?   Cervical back: Normal range of motion.  ?Lymphadenopathy:  ?   Cervical: No cervical adenopathy.  ?Skin: ?   General: Skin is warm and dry.  ?   Capillary Refill: Capillary refill takes less than 2 seconds.  ?   Coloration: Skin is not jaundiced or pale.  ?   Findings: No erythema.  ?Neurological:  ?   Mental Status: She is alert and oriented to person, place, and time.  ?   Motor: No weakness.  ?   Gait: Gait normal.  ?Psychiatric:     ?   Mood and Affect: Mood normal.     ?   Behavior: Behavior normal.  ? ? ? ?UC Treatments / Results  ?Labs ?(all labs ordered are listed, but only abnormal results are displayed) ?Labs Reviewed - No data to display ? ?EKG ? ? ?Radiology ?No results found. ? ?Procedures ?Procedures (including critical care time) ? ?Medications Ordered in UC ?Medications - No data to display ? ?Initial Impression / Assessment and Plan / UC Course  ?I have reviewed the triage vital signs and the nursing notes. ? ?Pertinent labs & imaging results that were available during  my care of the  patient were reviewed by me and considered in my medical decision making (see chart for details). ? ?  ?Treat bilateral bacterial conjunctivitis with erythromycin ointment every 6 hours for 7 days.  Discussed hand hygiene.  Discussed if symptoms worsen or do not improve with treatment, seek care. ?Final Clinical Impressions(s) / UC Diagnoses  ? ?Final diagnoses:  ?Bacterial conjunctivitis of both eyes  ? ? ? ?Discharge Instructions   ? ?  ?- Please apply the eye ointment 4 times daily for 7 days ?-If your symptoms persist or worsen, please seek care ? ? ? ? ?ED Prescriptions   ? ? Medication Sig Dispense Auth. Provider  ? erythromycin ophthalmic ointment Place into both eyes 4 (four) times daily for 7 days. Place a 1/2 inch ribbon of ointment into the lower eyelid. 3.5 g Eulogio Bear, NP  ? ?  ? ?PDMP not reviewed this encounter. ?  ?Eulogio Bear, NP ?07/15/21 1713 ? ?

## 2021-08-05 IMAGING — US US MFM OB FOLLOW-UP
1 series · 13 of 28 positions shown · non-contrast
Comparison: none

[Series 1: us mfm ob follow-up · 43 acquisitions, 13 frames shown]
[im 2/43]
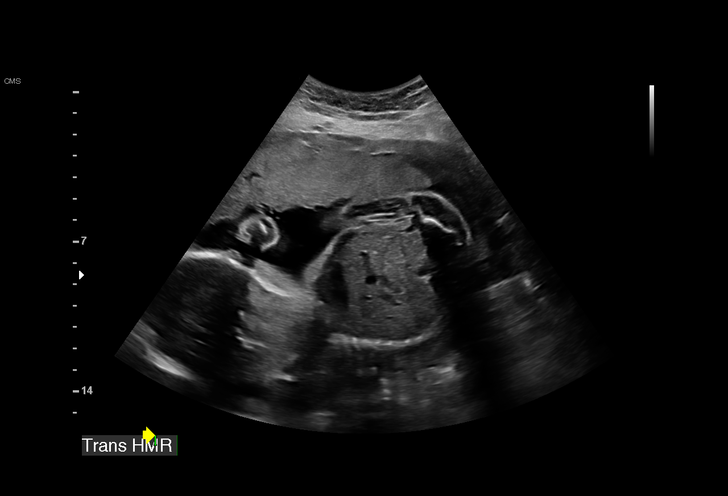
[im 5/43]
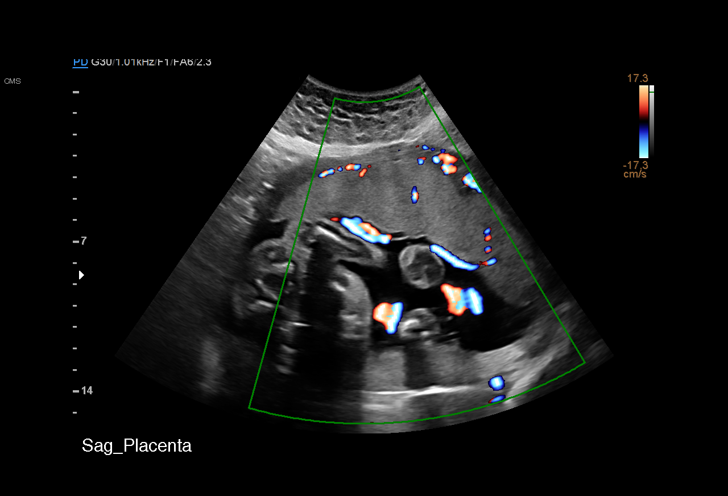
[im 8/43]
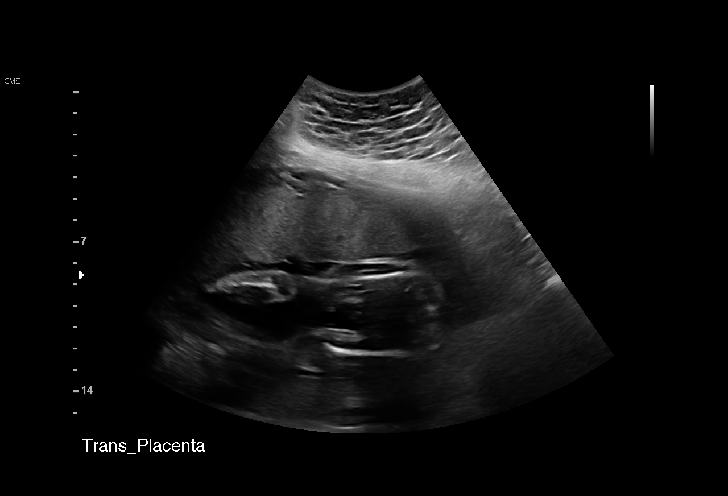
[im 11/43]
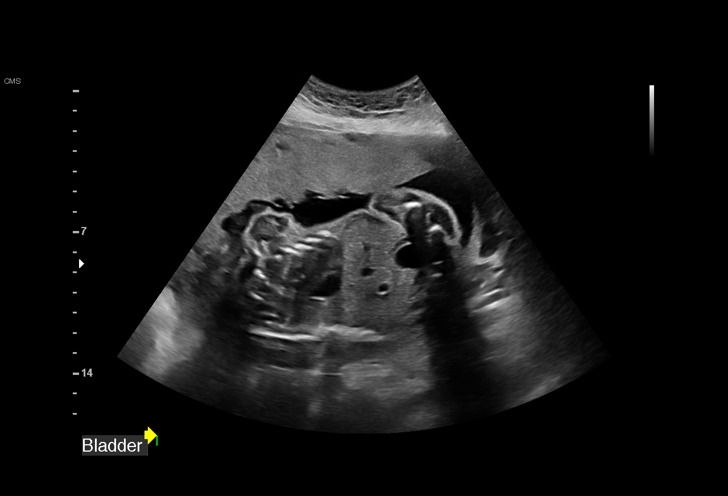
[im 15/43]
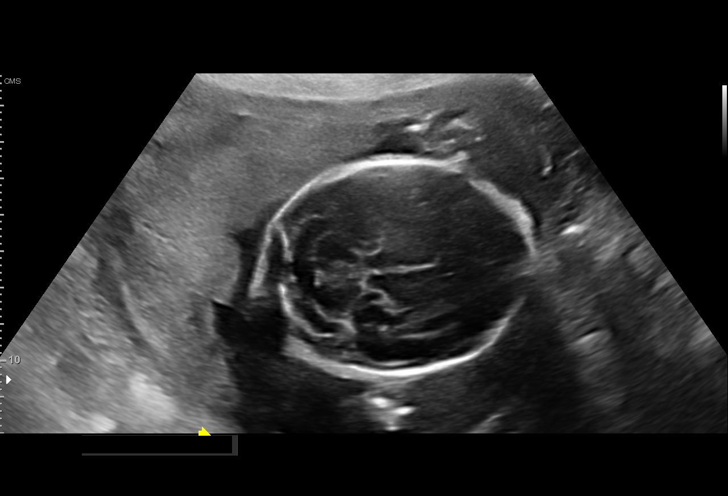
[im 18/43]
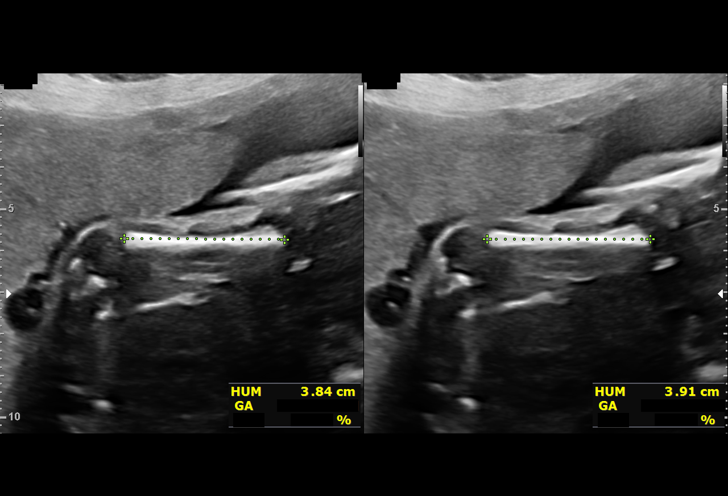
[im 22/43]
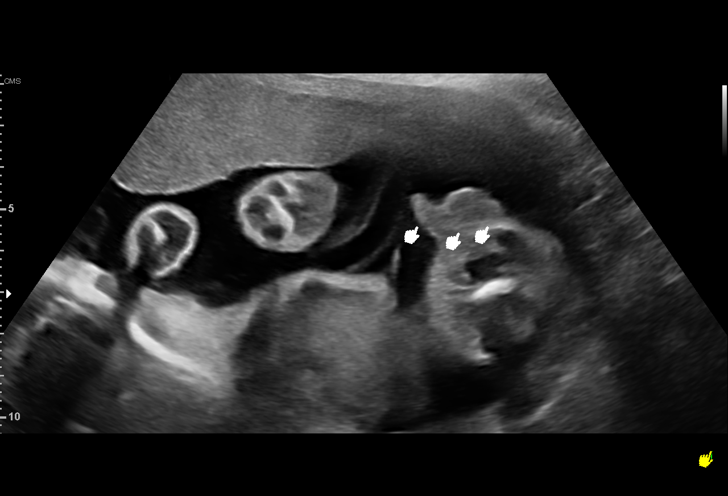
[im 25/43]
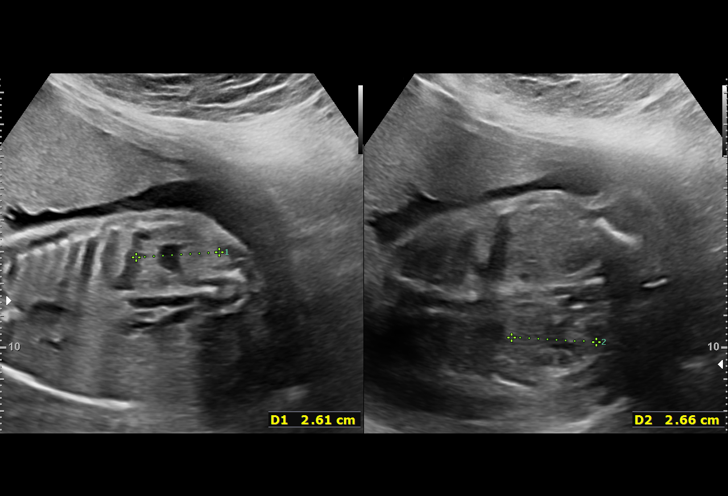
[im 29/43]
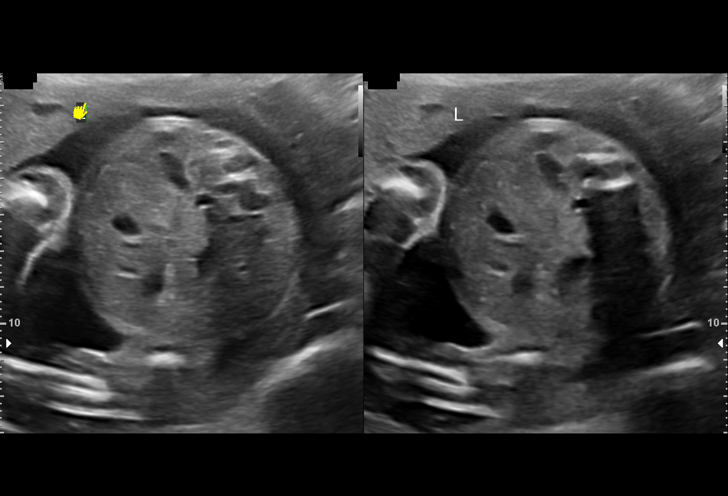
[im 32/43]
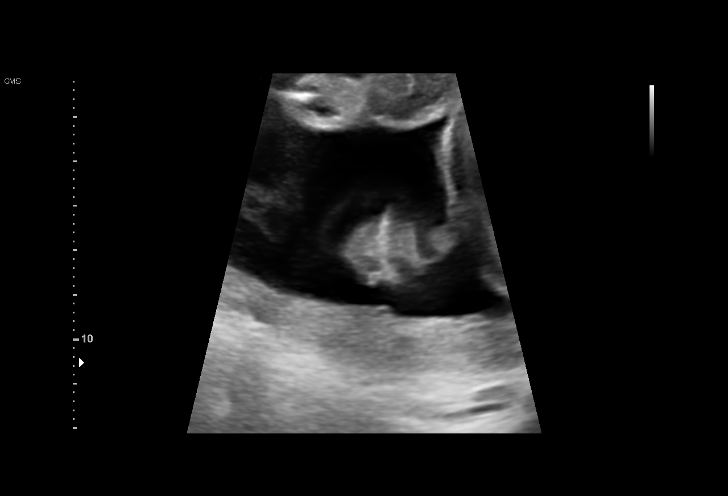
[im 35/43]
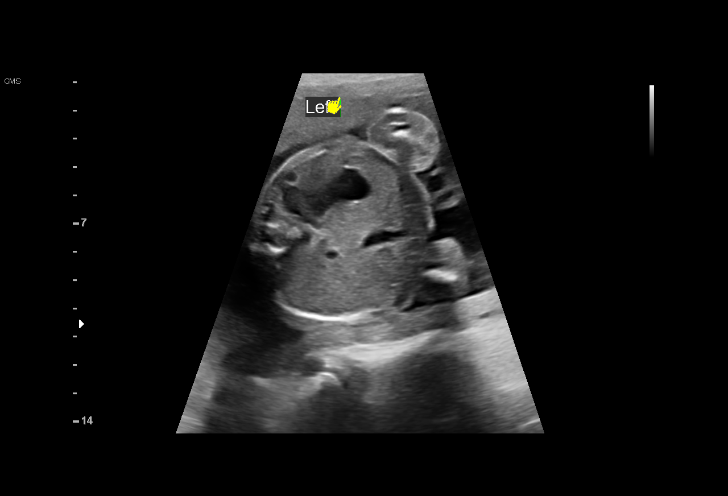
[im 38/43]
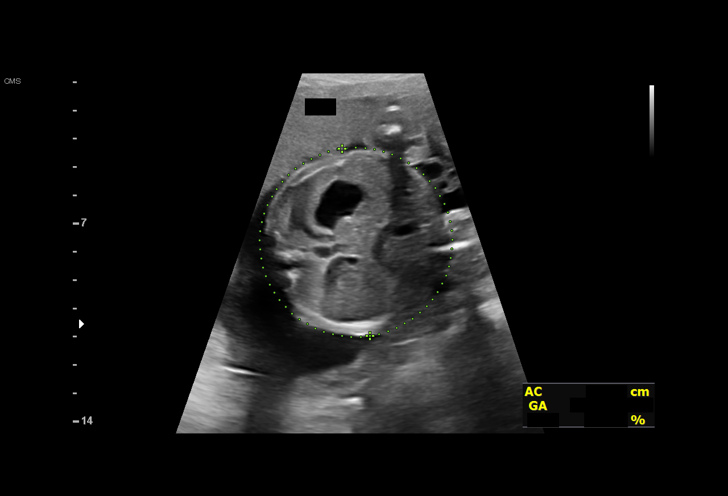
[im 41/43]
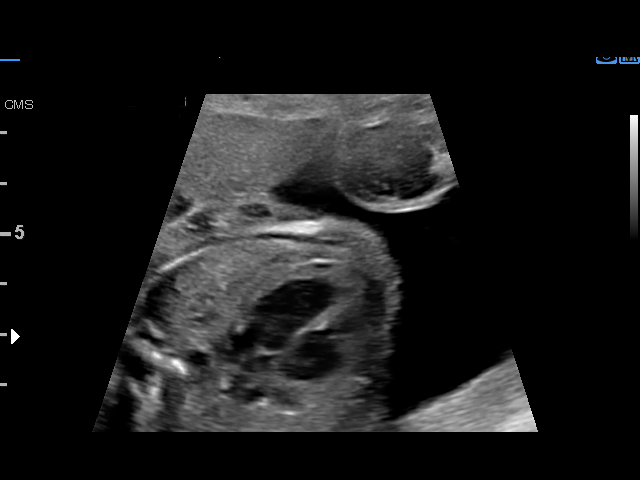

[13 of 28 positions shown; findings below may reference images not displayed]

Indications

 Advanced maternal age multigravida 39 by
 delivery, second trimester
 Poor obstetric history: Previous IUFD
 (stillbirth 8 months in [REDACTED])
 Encounter for other antenatal screening
 follow-up
 Grand multiparity, antepartum
 Poor obstetric history: Previous preterm
 delivery, antepartum x2 (36 wks)
 23 weeks gestation of pregnancy
Fetal Evaluation

 Num Of Fetuses:         1
 Fetal Heart Rate(bpm):  143
 Cardiac Activity:       Observed
 Presentation:           Transverse, head to maternal right
 Placenta:               Anterior
 P. Cord Insertion:      Visualized

 Amniotic Fluid
 AFI FV:      Within normal limits

                             Largest Pocket(cm)

Biometry
 BPD:      58.2  mm     G. Age:  23w 6d         43  %    CI:        71.43   %    70 - 86
                                                         FL/HC:      18.6   %    18.7 -
 HC:      219.3  mm     G. Age:  24w 0d         36  %    HC/AC:      1.03        1.05 -
 AC:      212.8  mm     G. Age:  25w 6d         92  %    FL/BPD:     69.9   %    71 - 87
 FL:       40.7  mm     G. Age:  23w 1d         18  %    FL/AC:      19.1   %    20 - 24
 HUM:      38.5  mm     G. Age:  23w 5d         37  %

 LV:        5.9  mm

 Est. FW:     711  gm      1 lb 9 oz     75  %
OB History

 Gravidity:    7         Term:   3        Prem:   2        SAB:   1
 TOP:          0       Ectopic:  0        Living: 4
Gestational Age

 LMP:           28w 2d        Date:  07/26/19                 EDD:   05/01/20
 U/S Today:     24w 2d                                        EDD:   05/29/20
 Best:          23w 6d     Det. By:  U/S  (12/15/19)          EDD:   06/01/20
Anatomy

 Cranium:               Appears normal         Aortic Arch:            Previously seen
 Cavum:                 Appears normal         Ductal Arch:            Previously seen
 Ventricles:            Appears normal         Diaphragm:              Appears normal
 Choroid Plexus:        Previously seen        Stomach:                Appears normal, left
                                                                       sided
 Cerebellum:            Appears normal         Abdomen:                Appears normal
 Posterior Fossa:       Previously seen        Abdominal Wall:         Appears nml (cord
                                                                       insert, abd wall)
 Nuchal Fold:           Previously seen        Cord Vessels:           Appears normal (3
                                                                       vessel cord)
 Face:                  Orbits and profile     Kidneys:                Left sided
                        previously seen
                                                                       pyelectasis,
                                                                       mm
 Lips:                  Appears normal         Bladder:                Appears normal
 Thoracic:              Appears normal         Spine:                  Previously seen
 Heart:                 Previously seen        Upper Extremities:      Previously seen
 RVOT:                  Previously seen        Lower Extremities:      Previously seen
 LVOT:                  Previously seen

 Other:  Technically difficult due to maternal habitus and fetal position. Heels
         and open hands/5th digits previously visualized. Nasal bone
         previously visualized. VC, 3VV and 3VTV previously visualized.
Cervix Uterus Adnexa

 Uterus
 No abnormality visualized.

 Right Ovary
 Not visualized.

 Left Ovary
 Not visualized.
 Cul De Sac
 No free fluid seen.

 Adnexa
 No abnormality visualized.
Comments

 This patient was seen for a follow up growth scan due to a
 prior fetal demise at around 32 weeks.  She denies any
 problems since her last exam.
 She was informed that the fetal growth and amniotic fluid
 level appears appropriate for her gestational age.
 Due to her history of a prior fetal demise, weekly fetal testing
 should be started at around 32 weeks.
 A follow-up growth scan was scheduled in 4 weeks.
 All conversations were held with the patient today with the
 help of a Spanish interpreter.

## 2022-05-29 ENCOUNTER — Encounter (HOSPITAL_COMMUNITY): Payer: Self-pay | Admitting: Emergency Medicine

## 2022-05-29 ENCOUNTER — Other Ambulatory Visit: Payer: Self-pay

## 2022-05-29 ENCOUNTER — Ambulatory Visit (HOSPITAL_COMMUNITY)
Admission: EM | Admit: 2022-05-29 | Discharge: 2022-05-29 | Disposition: A | Discharge status: Home / Self Care | Payer: Self-pay | Attending: Family Medicine | Admitting: Family Medicine

## 2022-05-29 DIAGNOSIS — L299 Pruritus, unspecified: Secondary | ICD-10-CM | POA: Insufficient documentation

## 2022-05-29 LAB — COMPREHENSIVE METABOLIC PANEL
ALT: 40 U/L (ref 0–44)
AST: 30 U/L (ref 15–41)
Albumin: 3.4 g/dL — ABNORMAL LOW (ref 3.5–5.0)
Alkaline Phosphatase: 112 U/L (ref 38–126)
Anion gap: 10 (ref 5–15)
BUN: 9 mg/dL (ref 6–20)
CO2: 23 mmol/L (ref 22–32)
Calcium: 9.1 mg/dL (ref 8.9–10.3)
Chloride: 104 mmol/L (ref 98–111)
Creatinine, Ser: 0.44 mg/dL (ref 0.44–1.00)
GFR, Estimated: >60 mL/min (ref >60)
Glucose, Bld: 82 mg/dL (ref 70–99)
Potassium: 3.5 mmol/L (ref 3.5–5.1)
Sodium: 137 mmol/L (ref 135–145)
Total Bilirubin: 0.4 mg/dL (ref 0.3–1.2)
Total Protein: 7.6 g/dL (ref 6.5–8.1)

## 2022-05-29 LAB — CBC WITH DIFFERENTIAL/PLATELET
Abs Immature Granulocytes: 0.02 10*3/uL (ref 0.00–0.07)
Basophils Absolute: 0 10*3/uL (ref 0.0–0.1)
Basophils Relative: 1 %
Eosinophils Absolute: 0.2 10*3/uL (ref 0.0–0.5)
Eosinophils Relative: 2 %
HCT: 33.8 % — ABNORMAL LOW (ref 36.0–46.0)
Hemoglobin: 10.5 g/dL — ABNORMAL LOW (ref 12.0–15.0)
Immature Granulocytes: 0 %
Lymphocytes Relative: 28 %
Lymphs Abs: 2.2 10*3/uL (ref 0.7–4.0)
MCH: 24.3 pg — ABNORMAL LOW (ref 26.0–34.0)
MCHC: 31.1 g/dL (ref 30.0–36.0)
MCV: 78.2 fL — ABNORMAL LOW (ref 80.0–100.0)
Monocytes Absolute: 0.6 10*3/uL (ref 0.1–1.0)
Monocytes Relative: 7 %
Neutro Abs: 4.9 10*3/uL (ref 1.7–7.7)
Neutrophils Relative %: 62 %
Platelets: 313 10*3/uL (ref 150–400)
RBC: 4.32 MIL/uL (ref 3.87–5.11)
RDW: 16.3 % — ABNORMAL HIGH (ref 11.5–15.5)
WBC: 7.9 10*3/uL (ref 4.0–10.5)
nRBC: 0 % (ref 0.0–0.2)

## 2022-05-29 MED ORDER — METHYLPREDNISOLONE 4 MG PO TBPK: ORAL_TABLET | ORAL | 0 refills | Status: DC

## 2022-05-29 NOTE — Discharge Instructions (Addendum)
Te atendieron hoy por picazn. Le he ordenado anlisis de laboratorio para su evaluacin. Mientras tanto, te envi un paquete de esteroides y deberas tomar Benadryl, de venta libre. El resultado de su anlisis de laboratorio debera estar listo maana y se le notificar si hay algo anormal. Si necesita ms pruebas o estudios, deber buscar un proveedor de Midwife. Vaya a [email or web address] para encontrar uno.   You were seen today for itching.  I have ordered lab work for you for evaluation.  In the mean time I have sent out a steroid pack and you should take over the counter benadryl.  Your lab work should be resulted by tomorrow and you will be notified if there is anything abnormal.  If you need further testing or work up you will need to find a primary care provider.  Please go to [email or web address] to find one.

## 2022-05-29 NOTE — ED Provider Notes (Signed)
[place]    CSN: [id number] Arrival date & time: 05/29/22  1250      History   Chief Complaint Chief Complaint  Patient presents with   Pruritis    HPI [name] is a 42 y.o. female.   Spanish interpreter used today.  Patient is here for 3 days of itching all over her body.  No rash noted.  Itching is from the neck down to her feet.  No changes in anything topical.  No new medications.   No new foods per se.   Of note in 04/2020 she had increased bile acids during pregnancy.  Treated with ursodiol at that time.        Past Medical History:  Diagnosis Date   Gestational diabetes    Medical history non-contributory     Patient Active Problem List   Diagnosis Date Noted   Language barrier 11/11/2019    Past Surgical History:  Procedure Laterality Date   APPENDECTOMY     CHOLECYSTECTOMY  12/15/2014   Procedure: LAPAROSCOPIC CHOLECYSTECTOMY, attempted cholangiogram;  Surgeon: Georganna Skeans, MD;  Location: [facility];  Service: General;;   LAPAROSCOPIC APPENDECTOMY  04/05/2012   Procedure: APPENDECTOMY LAPAROSCOPIC;  Surgeon: Madilyn Hook, DO;  Location: [facility];  Service: General;  Laterality: N/A;    OB History     Gravida  7   Para  6   Term  4   Preterm  2   AB  1   Living  5      SAB  1   IAB  0   Ectopic  0   Multiple  0   Live Births  5            Home Medications    Prior to Admission medications   Medication Sig Start Date End Date Taking? Authorizing Provider  acetaminophen (TYLENOL) 500 MG tablet Take 2 tablets (1,000 mg total) by mouth every 6 (six) hours as needed (for pain scale < 4). Patient not taking: Reported on 06/22/2020 [id number]   Arrie Senate, MD  Blood Pressure Monitoring (BLOOD PRESSURE KIT) DEVI 1 kit by Does not apply route as needed. Patient not taking: Reported on 06/22/2020 11/04/19   Constant, Peggy, MD  coconut oil OIL Apply 1 application topically as needed. Patient not taking:  Reported on 06/22/2020 [id number]   Arrie Senate, MD  ferrous sulfate 325 (65 FE) MG tablet Take 1 tablet (325 mg total) by mouth every other day. Patient not taking: Reported on 06/22/2020 [id number]   Arrie Senate, MD  ibuprofen (ADVIL) 600 MG tablet Take 1 tablet (600 mg total) by mouth every 6 (six) hours as needed. Patient not taking: Reported on 06/22/2020 [id number]   Arrie Senate, MD  Prenatal Vit-Fe Fumarate-FA (MULTIVITAMIN-PRENATAL) 27-0.8 MG TABS tablet Take 1 tablet by mouth daily at 12 noon. Patient not taking: Reported on 06/22/2020    [provider]  vitamin C (VITAMIN C) 250 MG tablet Take 1 tablet (250 mg total) by mouth every other day. Patient not taking: Reported on 06/22/2020 [id number]   Arrie Senate, MD    Family History Family History  Problem Relation Age of Onset   Stroke Neg Hx    Hypertension Neg Hx    Diabetes Neg Hx     Social History Social History   Tobacco Use   Smoking status: Never   Smokeless tobacco: Never  Vaping Use   Vaping Use: Never used  Substance  Use Topics   Alcohol use: No   Drug use: No     Allergies   Patient has no known allergies.   Review of Systems Review of Systems  Constitutional: Negative.   HENT: Negative.    Respiratory: Negative.    Cardiovascular: Negative.   Gastrointestinal: Negative.   Musculoskeletal: Negative.   Psychiatric/Behavioral: Negative.       Physical Exam Triage Vital Signs ED Triage Vitals  Enc Vitals Group     BP 05/29/22 1334 127/84     Pulse Rate 05/29/22 1334 75     Resp 05/29/22 1334 18     Temp 05/29/22 1334 98.7 F (37.1 C)     Temp Source 05/29/22 1334 Oral     SpO2 05/29/22 1334 97 %     Weight --      Height --      Head Circumference --      Peak Flow --      Pain Score 05/29/22 1333 0     Pain Loc --      Pain Edu? --      Excl. in [place]? --    No data found.  Updated Vital Signs BP 127/84 (BP Location: Left Arm)   Pulse 75   Temp 98.7 F  (37.1 C) (Oral)   Resp 18   LMP 04/30/2022   SpO2 97%   Visual Acuity Right Eye Distance:   Left Eye Distance:   Bilateral Distance:    Right Eye Near:   Left Eye Near:    Bilateral Near:     Physical Exam Constitutional:      Appearance: Normal appearance.  Cardiovascular:     Rate and Rhythm: Normal rate and regular rhythm.  Pulmonary:     Effort: Pulmonary effort is normal.     Breath sounds: Normal breath sounds.  Skin:    General: Skin is warm.     Findings: No rash.     Comments: Areas of excoriation on the left arm  Neurological:     General: No focal deficit present.     Mental Status: She is alert.  Psychiatric:        Mood and Affect: Mood normal.      UC Treatments / Results  Labs (all labs ordered are listed, but only abnormal results are displayed) Labs Reviewed  COMPREHENSIVE METABOLIC PANEL  CBC WITH DIFFERENTIAL/PLATELET  BILE ACIDS, TOTAL    EKG   Radiology No results found.  Procedures Procedures (including critical care time)  Medications Ordered in UC Medications - No data to display  Initial Impression / Assessment and Plan / UC Course  I have reviewed the triage vital signs and the nursing notes.  Pertinent labs & imaging results that were available during my care of the patient were reviewed by me and considered in my medical decision making (see chart for details).   Final Clinical Impressions(s) / UC Diagnoses   Final diagnoses:  Pruritus     Discharge Instructions      Te atendieron hoy por picazn. Le he ordenado anlisis de laboratorio para su evaluacin. Mientras tanto, te envi un paquete de esteroides y deberas tomar Benadryl, de venta libre. El resultado de su anlisis de laboratorio debera estar listo maana y se le notificar si hay algo anormal. Si necesita ms pruebas o estudios, deber buscar un proveedor de Midwife. Vaya a [email or web address] para encontrar uno.   You were seen today for  itching.  I have  ordered lab work for you for evaluation.  In the mean time I have sent out a steroid pack and you should take over the counter benadryl.  Your lab work should be resulted by tomorrow and you will be notified if there is anything abnormal.  If you need further testing or work up you will need to find a primary care provider.  Please go to [email or web address] to find one.     ED Prescriptions     Medication Sig Dispense Auth. Provider   methylPREDNISolone (MEDROL DOSEPAK) 4 MG TBPK tablet Take as directed 1 each Rondel Oh, MD      PDMP not reviewed this encounter.   Rondel Oh, MD 05/29/22 1356

## 2022-05-29 NOTE — ED Triage Notes (Signed)
Patient presents to [facility] after she woke up 3 nights ago itching.  Denies pain.  Denies rash.  States it is all over.  No new medicines, no new lotions.

## 2022-05-31 LAB — BILE ACIDS, TOTAL: Bile Acids Total: 10.9 umol/L — ABNORMAL HIGH (ref 0.0–10.0)

## 2022-09-19 ENCOUNTER — Ambulatory Visit (HOSPITAL_COMMUNITY)
Admission: EM | Admit: 2022-09-19 | Discharge: 2022-09-19 | Disposition: A | Discharge status: Home / Self Care | Payer: Self-pay | Attending: Internal Medicine | Admitting: Internal Medicine

## 2022-09-19 ENCOUNTER — Other Ambulatory Visit: Payer: Self-pay

## 2022-09-19 ENCOUNTER — Ambulatory Visit (INDEPENDENT_AMBULATORY_CARE_PROVIDER_SITE_OTHER): Payer: Self-pay

## 2022-09-19 ENCOUNTER — Encounter (HOSPITAL_COMMUNITY): Payer: Self-pay | Admitting: Emergency Medicine

## 2022-09-19 DIAGNOSIS — G43009 Migraine without aura, not intractable, without status migrainosus: Secondary | ICD-10-CM

## 2022-09-19 DIAGNOSIS — M542 Cervicalgia: Secondary | ICD-10-CM

## 2022-09-19 DIAGNOSIS — R509 Fever, unspecified: Secondary | ICD-10-CM

## 2022-09-19 MED ORDER — DEXAMETHASONE SODIUM PHOSPHATE 10 MG/ML IJ SOLN
INTRAMUSCULAR | Status: AC
  Filled 2022-09-19: qty 1

## 2022-09-19 MED ORDER — METOCLOPRAMIDE HCL 5 MG/ML IJ SOLN
INTRAMUSCULAR | Status: AC
  Filled 2022-09-19: qty 2

## 2022-09-19 MED ORDER — KETOROLAC TROMETHAMINE 30 MG/ML IJ SOLN
INTRAMUSCULAR | Status: AC
  Filled 2022-09-19: qty 1

## 2022-09-19 MED ORDER — KETOROLAC TROMETHAMINE 30 MG/ML IJ SOLN
30.0000 mg | Freq: Once | INTRAMUSCULAR | Status: AC
  Administered 2022-09-19: 30 mg via INTRAMUSCULAR

## 2022-09-19 MED ORDER — METHOCARBAMOL 500 MG PO TABS: 500.0000 mg | ORAL_TABLET | Freq: Two times a day (BID) | ORAL | 0 refills | Status: DC

## 2022-09-19 MED ORDER — DEXAMETHASONE SODIUM PHOSPHATE 10 MG/ML IJ SOLN
10.0000 mg | Freq: Once | INTRAMUSCULAR | Status: AC
  Administered 2022-09-19: 10 mg via INTRAMUSCULAR

## 2022-09-19 MED ORDER — METOCLOPRAMIDE HCL 5 MG/ML IJ SOLN
5.0000 mg | Freq: Once | INTRAMUSCULAR | Status: AC
  Administered 2022-09-19: 5 mg via INTRAMUSCULAR

## 2022-09-19 NOTE — ED Triage Notes (Signed)
Spanish speaker pt c/o back and neck pain since Sunday, denies any injury.

## 2022-09-19 NOTE — Discharge Instructions (Addendum)
No est claro qu est causando el dolor de cuello. Radiografas negativas para anomalas seas en el cuello. Te di [employer] clnica para ayudarte con tu dolor de cabeza y su dolor de cuello. No tomes ibuprofeno hasta maana por la maana ya que te di los medicamentos en la clnica.  A partir de maana podr tomar ibuprofeno 600 mg cada 6 horas segn sea necesario para [name]. Esto significa que puede tomar 3 pastillas de venta libre cada 6 horas segn sea necesario.  Tambin puede tomar Tylenol segn sea necesario para el dolor.  Tambin puede tomar el relajante muscular Robaxin segn sea necesario para los espasmos musculares. Este medicamento puede causarle sueo, as que no lo tome [employer] o cuando conduzca o beba alcohol, ya que puede causarle somnolencia.  Por favor programe una cita con el neurlogo en su trmite para seguimiento.  Si sus sntomas no mejoran en las prximas 24 a 48 horas o si vuelve a tener fiebre, vaya a la sala de emergencias ms cercana para una evaluacin adicional.  It is unclear what is causing your neck pain. X-rays negative for bony abnormalities to the neck. I gave you medicine in the clinic to help with your headache and her neck pain. Do not take any ibuprofen until tomorrow morning since I gave you the medicines in the clinic.  Starting tomorrow, you may take ibuprofen 600 mg every 6 hours as needed for pain.  This means you can take 3 over-the-counter pills every 6 hours as needed.  You may also take Tylenol as needed for pain.  You may also take Robaxin muscle relaxer as needed for muscle spasm.  This medicine can make you sleepy so do not take it during the day or when driving or drinking alcohol since it can make you drowsy.  Please schedule an appointment with the neurologist on your paperwork for follow-up.  If your symptoms do not get better in the next 24 to 48 hours or if you spike another fever, please go to the nearest  emergency room for further evaluation.

## 2022-09-19 NOTE — ED Provider Notes (Signed)
[facility]    CSN: [id number] Arrival date & time: 09/19/22  0840      History   Chief Complaint Chief Complaint  Patient presents with   Back Pain    Spanish speaker pt c/o back and neck pain since Sunday, denies any injury.    HPI [name] is a 42 y.o. female.   Patient presents to urgent care for evaluation of posterior neck pain that started 3 days ago on Sunday, September 16, 2022.  Denies recent trauma/injuries to the neck.  States neck pain radiates down the spine intermittently with forward flexion of the head at the neck and radiates to the left side of the neck/left side of the head. Head and neck pain is currently an 8 or a 9 on a scale of 0-10.  Denies vision changes, dizziness, extremity weakness, paresthesias, chest pain, shortness of breath, and heart palpitations.  Pain is worse with movement of the neck and the back.  Headache does not cross midline and is only to the left side of the head.She also reports photophobia and phonophobia.  States she had a fever of 100.52 days ago on Monday, September 17, 2022.  She has felt intermittent chills without documented fever at home over the last 2 days.    Denies history of meningitis and vaccination against meningitis.  No recent cough, viral URI symptoms, urinary symptoms, abdominal pain, or ear pain/sore throat.  Last menstrual cycle was on September 13, 2022.  History of migraines but states she has not had a migraine in approximately 1 year.  Denies aura with migraines.  She is not been seen by a neurologist for history of migraines.  Taking ibuprofen 400 mg intermittently without much relief of pain.  Last dose of 400 mg of ibuprofen was approximately 3 hours prior to arrival to urgent care and has not helped very much with pain.   Back Pain   Past Medical History:  Diagnosis Date   Gestational diabetes    Medical history non-contributory     Patient Active Problem List   Diagnosis Date Noted   Language barrier  11/11/2019    Past Surgical History:  Procedure Laterality Date   APPENDECTOMY     CHOLECYSTECTOMY  12/15/2014   Procedure: LAPAROSCOPIC CHOLECYSTECTOMY, attempted cholangiogram;  Surgeon: Violeta Gelinas, MD;  Location: [facility];  Service: General;;   LAPAROSCOPIC APPENDECTOMY  04/05/2012   Procedure: APPENDECTOMY LAPAROSCOPIC;  Surgeon: Lodema Pilot, DO;  Location: [facility];  Service: General;  Laterality: N/A;    OB History     Gravida  7   Para  6   Term  4   Preterm  2   AB  1   Living  5      SAB  1   IAB  0   Ectopic  0   Multiple  0   Live Births  5            Home Medications    Prior to Admission medications   Medication Sig Start Date End Date Taking? Authorizing Provider  methocarbamol (ROBAXIN) 500 MG tablet Take 1 tablet (500 mg total) by mouth 2 (two) times daily. 09/19/22  Yes Carlisle Beers, FNP  methylPREDNISolone (MEDROL DOSEPAK) 4 MG TBPK tablet Take as directed 05/29/22   Jannifer Franklin, MD    Family History Family History  Problem Relation Age of Onset   Stroke Neg Hx    Hypertension Neg Hx    Diabetes Neg  Hx     Social History Social History   Tobacco Use   Smoking status: Never   Smokeless tobacco: Never  Vaping Use   Vaping Use: Never used  Substance Use Topics   Alcohol use: No   Drug use: No     Allergies   Patient has no known allergies.   Review of Systems Review of Systems  Musculoskeletal:  Positive for back pain.  Per HPI   Physical Exam Triage Vital Signs ED Triage Vitals [09/19/22 0910]  Enc Vitals Group     BP 133/82     Pulse Rate (!) 103     Resp 18     Temp 98.3 F (36.8 C)     Temp Source Oral     SpO2 98 %     Weight 156 lb 8.4 oz (71 kg)     Height 4\' 11"  (1.499 m)     Head Circumference      Peak Flow      Pain Score 8     Pain Loc      Pain Edu?      Excl. in [place]?    No data found.  Updated Vital Signs BP 133/82 (BP Location: Right Arm)   Pulse (!) 103   Temp 98.3 F  (36.8 C) (Oral)   Resp 18   Ht 4\' 11"  (1.499 m)   Wt 156 lb 8.4 oz (71 kg)   LMP 09/13/2022 (Approximate)   SpO2 98%   BMI 31.61 kg/m   Visual Acuity Right Eye Distance:   Left Eye Distance:   Bilateral Distance:    Right Eye Near:   Left Eye Near:    Bilateral Near:     Physical Exam Vitals and nursing note reviewed.  Constitutional:      Appearance: She is not ill-appearing or toxic-appearing.  HENT:     Head: Normocephalic and atraumatic.     Right Ear: Hearing and external ear normal.     Left Ear: Hearing and external ear normal.     Nose: Nose normal.     Mouth/Throat:     Lips: Pink.  Eyes:     General: Lids are normal. Vision grossly intact. Gaze aligned appropriately.     Extraocular Movements: Extraocular movements intact.     Conjunctiva/sclera: Conjunctivae normal.     Comments: EOMs intact without pain or dizziness.  Neck:     Trachea: Trachea and phonation normal.     Meningeal: Brudzinski's sign and Kernig's sign absent.  Cardiovascular:     Rate and Rhythm: Normal rate and regular rhythm.     Heart sounds: Normal heart sounds, S1 normal and S2 normal.  Pulmonary:     Effort: Pulmonary effort is normal. No respiratory distress.     Breath sounds: Normal breath sounds and air entry.  Musculoskeletal:     Cervical back: Normal range of motion and neck supple. No edema, erythema, signs of trauma, rigidity, torticollis or crepitus. Pain with movement, spinous process tenderness and muscular tenderness present. Normal range of motion.  Lymphadenopathy:     Cervical: No cervical adenopathy.  Skin:    General: Skin is warm and dry.     Capillary Refill: Capillary refill takes less than 2 seconds.     Findings: No rash.  Neurological:     General: No focal deficit present.     Mental Status: She is alert and oriented to person, place, and time. Mental status is at baseline.  Cranial Nerves: Cranial nerves 2-12 are intact. No dysarthria or facial  asymmetry.     Sensory: Sensation is intact.     Motor: Motor function is intact.     Coordination: Coordination is intact.     Gait: Gait is intact.     Comments: Strength and sensation intact to bilateral upper and lower extremities (5/5). Moves all 4 extremities with normal coordination voluntarily. Non-focal neuro exam.   Psychiatric:        Mood and Affect: Mood normal.        Speech: Speech normal.        Behavior: Behavior normal.        Thought Content: Thought content normal.        Judgment: Judgment normal.      UC Treatments / Results  Labs (all labs ordered are listed, but only abnormal results are displayed) Labs Reviewed  CBC  BASIC METABOLIC PANEL    EKG   Radiology DG Cervical Spine Complete  Result Date: 09/19/2022 CLINICAL DATA:  Neck pain EXAM: CERVICAL SPINE - COMPLETE 5 VIEW COMPARISON:  None Available. FINDINGS: There is no evidence of cervical spine fracture or prevertebral soft tissue swelling. Straightening of the normal cervical lordosis. No listhesis. Disc heights are preserved. No other significant bone abnormalities are identified. IMPRESSION: Negative cervical spine radiographs. Electronically Signed   By: Wiliam Ke M.D.   On: 09/19/2022 11:19    Procedures Procedures (including critical care time)  Medications Ordered in UC Medications  ketorolac (TORADOL) 30 MG/ML injection 30 mg (30 mg Intramuscular Given 09/19/22 1055)  metoCLOPramide (REGLAN) injection 5 mg (5 mg Intramuscular Given 09/19/22 1055)  dexamethasone (DECADRON) injection 10 mg (10 mg Intramuscular Given 09/19/22 1055)    Initial Impression / Assessment and Plan / UC Course  I have reviewed the triage vital signs and the nursing notes.  Pertinent labs & imaging results that were available during my care of the patient were reviewed by me and considered in my medical decision making (see chart for details).   1.  Neck pain, fever, migraine without aura Presentation  consistent with acute migraine without aura. Patient given migraine cocktail in clinic with some relief of head pain prior to discharge. Low suspicion for bacterial/viral meningitis. No NSAIDs for 24 hours, then may use ibuprofen as needed for further aches and pains. May use robaxin as needed for muscle spasm. Drowsiness precautions discussed. Heat and gentle ROM exercises recommended. PCP follow-up in 1-2 weeks recommended.  Discussed red flag signs and symptoms of worsening condition,when to call the PCP office, return to urgent care, and when to seek higher level of care in the emergency department. Counseled patient regarding appropriate use of medications and potential side effects for all medications recommended or prescribed today. Patient verbalizes understanding and agreement with plan. Discharged in stable condition.     Final Clinical Impressions(s) / UC Diagnoses   Final diagnoses:  Neck pain  Migraine without aura and without status migrainosus, not intractable  Fever, unspecified     Discharge Instructions      No est claro qu est causando el dolor de cuello. Radiografas negativas para anomalas seas en el cuello. Te di [employer] clnica para ayudarte con tu dolor de cabeza y su dolor de cuello. No tomes ibuprofeno hasta maana por la maana ya que te di los medicamentos en la clnica.  A partir de maana podr tomar ibuprofeno 600 mg cada 6 horas segn sea necesario para Chief Technology Officer.  Esto significa que puede tomar 3 pastillas de venta libre cada 6 horas segn sea necesario.  Tambin puede tomar Tylenol segn sea necesario para el dolor.  Tambin puede tomar el relajante muscular Robaxin segn sea necesario para los espasmos musculares. Este medicamento puede causarle sueo, as que no lo tome [employer] o cuando conduzca o beba alcohol, ya que puede causarle somnolencia.  Por favor programe una cita con el neurlogo en su trmite para seguimiento.  Si sus  sntomas no mejoran en las prximas 24 a 48 horas o si vuelve a tener fiebre, vaya a la sala de emergencias ms cercana para una evaluacin adicional.  It is unclear what is causing your neck pain. X-rays negative for bony abnormalities to the neck. I gave you medicine in the clinic to help with your headache and her neck pain. Do not take any ibuprofen until tomorrow morning since I gave you the medicines in the clinic.  Starting tomorrow, you may take ibuprofen 600 mg every 6 hours as needed for pain.  This means you can take 3 over-the-counter pills every 6 hours as needed.  You may also take Tylenol as needed for pain.  You may also take Robaxin muscle relaxer as needed for muscle spasm.  This medicine can make you sleepy so do not take it during the day or when driving or drinking alcohol since it can make you drowsy.  Please schedule an appointment with the neurologist on your paperwork for follow-up.  If your symptoms do not get better in the next 24 to 48 hours or if you spike another fever, please go to the nearest emergency room for further evaluation.       ED Prescriptions     Medication Sig Dispense Auth. Provider   methocarbamol (ROBAXIN) 500 MG tablet Take 1 tablet (500 mg total) by mouth 2 (two) times daily. 20 tablet Carlisle Beers, FNP      PDMP not reviewed this encounter.   Carlisle Beers, [place] 09/25/22 2141

## 2022-09-25 ENCOUNTER — Ambulatory Visit: Payer: Self-pay | Admitting: Family Medicine

## 2022-11-19 ENCOUNTER — Encounter: Payer: Self-pay | Admitting: Family Medicine

## 2022-11-19 ENCOUNTER — Ambulatory Visit: Payer: Self-pay | Attending: Family Medicine | Admitting: Family Medicine

## 2022-11-19 VITALS — BP 126/77 | HR 95 | Temp 98.1°F | Ht 59.0 in | Wt 165.4 lb

## 2022-11-19 DIAGNOSIS — Z131 Encounter for screening for diabetes mellitus: Secondary | ICD-10-CM

## 2022-11-19 DIAGNOSIS — N926 Irregular menstruation, unspecified: Secondary | ICD-10-CM

## 2022-11-19 DIAGNOSIS — L299 Pruritus, unspecified: Secondary | ICD-10-CM

## 2022-11-19 LAB — POCT GLYCOSYLATED HEMOGLOBIN (HGB A1C): Hemoglobin A1C: 5.5 % (ref 4.0–5.6)

## 2022-11-19 LAB — POCT URINE PREGNANCY: Preg Test, Ur: NEGATIVE

## 2022-11-19 MED ORDER — HYDROXYZINE HCL 25 MG PO TABS: 25.0000 mg | ORAL_TABLET | Freq: Two times a day (BID) | ORAL | 1 refills | Status: DC | PRN

## 2022-11-19 NOTE — Patient Instructions (Addendum)
Prurito Pruritus El prurito es una sensacin de picazn en la piel. Una de las causas ms frecuentes es la sequedad de la piel, pero muchas cosas diferentes pueden causar picazn. La mayora de las causas de picazn no requieren [place]. A veces, la picazn en la piel puede convertirse en una erupcin cutnea o en una infeccin secundaria. Siga estas instrucciones en su casa: Cuidado de la piel  No utilice jabones perfumados, detergentes, perfumes ni cosmticos. En lugar de eso, utilice versiones suaves y sin perfume de estos productos. Aplquese cremas hidratantes en la piel con frecuencia, al [facility] al da. Aplqueselas inmediatamente despus del bao, mientras la piel an est hmeda. Tome los medicamentos o aplquese las cremas con medicamento solamente como se lo haya indicado el mdico. Esto puede incluir: Crema con corticoesteroides o inhibidor tpico de la calcineurina. Lociones para Associate Professor que contienen urea, alcanfor o mentol. Antihistamnicos por va oral. No tome baos de inmersin ni duchas calientes, ya que pueden empeorar la picazn. Una ducha breve de agua fra puede ayudar a Associate Professor, siempre y cuando use un humectante despus de la ducha. Aplique un pao fro y hmedo (compresa fra) en la zona afectada. Puede tomar baos de inmersin tibios con uno de los siguientes: Sales de [employer]. Puede conseguirlas en la tienda de comestibles o la farmacia local. Siga las instrucciones del envase. Bicarbonato de sodio. Vierta un poco en la baera como se lo haya indicado el mdico. Avena coloidal. Puede conseguirla en la tienda de comestibles o la farmacia local. Siga las instrucciones del envase. No se rasque la piel. Instrucciones generales Evite usar ropa ajustada. Lleve un diario como ayuda para determinar la causa de la picazn. Escriba los siguientes datos: Lo que come y [place]. Los cosmticos que [place]. Qu detergentes o [employer]. Lo  que lleva puesto, incluidas las joyas. Use un humidificador. Este [employer] humedad del aire, lo que ayuda a Chartered certified accountant. Preste atencin a cualquier Advertising account planner. Informe al mdico si nota algn cambio. Comunquese con un mdico si: La picazn no desaparece despus de [employer]. Nota enrojecimiento, calor o secrecin en el lugar de la piel donde se rasc. Tiene una sed inusual u orina ms de lo normal. Siente hormigueo o adormecimiento en la piel. Nota que la piel o la zona blanca de los ojos estn amarillas (ictericia). Se siente dbil. Tiene alguno de los siguientes sntomas: Sudoracin nocturna. Cansancio (fatiga). Prdida de peso. Dolor abdominal. Resumen El prurito es una sensacin de picazn en la piel. Una de las causas ms frecuentes es la sequedad de la piel, pero muchas afecciones y factores diferentes pueden causar picazn. Aplquese cremas hidratantes en la piel con frecuencia, al [facility] al da. Aplqueselas inmediatamente despus del bao, mientras la piel an est hmeda. Tome los medicamentos o aplquese las cremas con medicamento solamente como se lo haya indicado el mdico. No se duche ni tome baos de inmersin con agua caliente. No utilice jabones perfumados, detergentes, perfumes ni cosmticos. Lleve un diario como ayuda para determinar la causa de la picazn. Esta informacin no tiene Theme park manager el consejo del mdico. Asegrese de hacerle al mdico cualquier pregunta que tenga. Document Revised: 05/17/2021 Document Reviewed: 05/17/2021 Elsevier Patient Education  2024 [employer].

## 2022-11-19 NOTE — Progress Notes (Signed)
Subjective:  Patient ID: [name], female    DOB: [birth year]  Age: 42 y.o. MRN: [id number]  CC: New Patient (Initial Visit) (Itching all over body/Irregular periods in June.)   HPI [name] is a 42 y.o. year old female with a history of stational diabetes mellitus here to establish care. She is accompanied by an in person interpreter.  Interval History: Discussed the use of AI scribe software for clinical note transcription with the patient, who gave verbal consent to proceed.  She presents with irregular menstruation and generalized itching. She reports having two menstrual cycles in June, but none in July or August. Prior to June, her menstrual cycle was regular, occurring every 25 days and lasting for six days. She describes her menstrual bleeding as heavy and with clots, which has been a consistent feature of her menstruation.  The patient also reports generalized itching, which she first experienced during a previous pregnancy and per urgent care notes was diagnosed with elevated bile acids and treated with ursodiol.  At her urgent care visit in 05/2022 she was prescribed prednisone.  The itching resolved but recurred three weeks ago.  This has only occurred on 3 occasions; during her pregnancy, 6 months ago and again 3 weeks ago.  The itching is present throughout the day but intensifies after showering and before bed. She denies any associated hives or known allergies.  Symptoms are not worse after taking hot showers or going out in the heat.      Past Medical History:  Diagnosis Date   Gestational diabetes    Medical history non-contributory     Past Surgical History:  Procedure Laterality Date   APPENDECTOMY     CHOLECYSTECTOMY  12/15/2014   Procedure: LAPAROSCOPIC CHOLECYSTECTOMY, attempted cholangiogram;  Surgeon: Violeta Gelinas, MD;  [facility]: [facility];  Service: General;;   LAPAROSCOPIC APPENDECTOMY  04/05/2012   Procedure: APPENDECTOMY LAPAROSCOPIC;   Surgeon: Lodema Pilot, DO;  Location: [facility];  Service: General;  Laterality: N/A;    Family History  Problem Relation Age of Onset   Stroke Neg Hx    Hypertension Neg Hx    Diabetes Neg Hx     Social History   Socioeconomic History   Marital status: Single    Spouse name: Not on file   Number of children: Not on file   Years of education: Not on file   Highest education level: Not on file  Occupational History   Not on file  Tobacco Use   Smoking status: Never   Smokeless tobacco: Never  Vaping Use   Vaping status: Never Used  Substance and Sexual Activity   Alcohol use: No   Drug use: No   Sexual activity: Yes    Birth control/protection: None  Other Topics Concern   Not on file  Social History Narrative   Not on file   Social Determinants of Health   Financial Resource Strain: Not on file  Food Insecurity: Not on file  Transportation Needs: Not on file  Physical Activity: Not on file  Stress: Not on file  Social Connections: Not on file    No Known Allergies  Outpatient Medications Prior to Visit  Medication Sig Dispense Refill   methocarbamol (ROBAXIN) 500 MG tablet Take 1 tablet (500 mg total) by mouth 2 (two) times daily. 20 tablet 0   methylPREDNISolone (MEDROL DOSEPAK) 4 MG TBPK tablet Take as directed 1 each 0   No facility-administered medications prior to visit.  ROS Review of Systems  Constitutional:  Negative for activity change and appetite change.  HENT:  Negative for sinus pressure and sore throat.   Respiratory:  Negative for chest tightness, shortness of breath and wheezing.   Cardiovascular:  Negative for chest pain and palpitations.  Gastrointestinal:  Negative for abdominal distention, abdominal pain and constipation.  Genitourinary:  Positive for menstrual problem.  Musculoskeletal: Negative.   Psychiatric/Behavioral:  Negative for behavioral problems and dysphoric mood.     Objective:  BP 126/77   Pulse 95   Temp 98.1 F  (36.7 C) (Oral)   Ht 4\' 11"  (1.499 m)   Wt 165 lb 6.4 oz (75 kg)   LMP 09/08/2022 (Approximate)   SpO2 99%   BMI 33.41 kg/m      11/19/2022    9:06 AM 09/19/2022    9:10 AM 05/29/2022    1:34 PM  BP/Weight  Systolic BP 126 133 127  Diastolic BP 77 82 84  Wt. (Lbs) 165.4 156.53   BMI 33.41 kg/m2 31.61 kg/m2       Physical Exam Constitutional:      Appearance: She is well-developed.  Cardiovascular:     Rate and Rhythm: Normal rate.     Heart sounds: Normal heart sounds. No murmur heard. Pulmonary:     Effort: Pulmonary effort is normal.     Breath sounds: Normal breath sounds. No wheezing or rales.  Chest:     Chest wall: No tenderness.  Abdominal:     General: Bowel sounds are normal. There is no distension.     Palpations: Abdomen is soft. There is no mass.     Tenderness: There is no abdominal tenderness.  Musculoskeletal:        General: Normal range of motion.     Right lower leg: No edema.     Left lower leg: No edema.  Neurological:     Mental Status: She is alert and oriented to person, place, and time.  Psychiatric:        Mood and Affect: Mood normal.        Latest Ref Rng & Units 05/29/2022    1:52 PM 06/15/2016    4:30 PM 12/15/2014    7:26 AM  CMP  Glucose 70 - 99 mg/dL 82  76  161   BUN 6 - 20 mg/dL 9  10  <5   Creatinine 0.44 - 1.00 mg/dL 0.96  0.45  4.09   Sodium 135 - 145 mmol/L 137  138  134   Potassium 3.5 - 5.1 mmol/L 3.5  4.0  3.9   Chloride 98 - 111 mmol/L 104  104  103   CO2 22 - 32 mmol/L 23  24  24    Calcium 8.9 - 10.3 mg/dL 9.1  9.3  8.6   Total Protein 6.5 - 8.1 g/dL 7.6  7.7  7.2   Total Bilirubin 0.3 - 1.2 mg/dL 0.4  0.3  0.6   Alkaline Phos 38 - 126 U/L 112  94  126   AST 15 - 41 U/L 30  17  60   ALT 0 - 44 U/L 40  16  66     Lipid Panel     Component Value Date/Time   CHOL 144 06/15/2016 1630   TRIG 85 06/15/2016 1630   HDL 35 (L) 06/15/2016 1630   CHOLHDL 4.1 06/15/2016 1630   VLDL 17 06/15/2016 1630   LDLCALC 92  06/15/2016 1630    CBC  Component Value Date/Time   WBC 7.9 05/29/2022 1352   RBC 4.32 05/29/2022 1352   HGB 10.5 (L) 05/29/2022 1352   HGB 11.4 03/02/2020 1002   HGB 11.2 07/27/2013 0000   HCT 33.8 (L) 05/29/2022 1352   HCT 33.7 (L) 03/02/2020 1002   HCT 34 07/27/2013 0000   PLT 313 05/29/2022 1352   PLT 325 03/02/2020 1002   PLT 273 05/18/2013 0000   MCV 78.2 (L) 05/29/2022 1352   MCV 84 03/02/2020 1002   MCH 24.3 (L) 05/29/2022 1352   MCHC 31.1 05/29/2022 1352   RDW 16.3 (H) 05/29/2022 1352   RDW 12.8 03/02/2020 1002   LYMPHSABS 2.2 05/29/2022 1352   LYMPHSABS 1.3 11/11/2019 1101   MONOABS 0.6 05/29/2022 1352   EOSABS 0.2 05/29/2022 1352   EOSABS 0.1 11/11/2019 1101   BASOSABS 0.0 05/29/2022 1352   BASOSABS 0.0 11/11/2019 1101    Lab Results  Component Value Date   HGBA1C 5.5 11/19/2022    Assessment & Plan:      Menstrual Irregularity Reported irregular periods in June with heavy bleeding and clots. No period in July. -Order pregnancy test (result negative). -Plan for ultrasound to assess for fibroids.  Pruritus History of itching during pregnancy with elevated bile acids. Recent recurrence of itching, worse at night and after showering. No hives or rash. -Order blood work to assess liver function. -Prescribe Hydroxyzine as needed for itching, with caution regarding potential drowsiness. -Recommend over-the-counter Sarna lotion or Calamine lotion for topical relief.   History of gestational diabetes. -Check HbA1c (result 5.5, within normal range).  Cervical Cancer Screening Due for Pap smear. -Schedule appointment for Pap smear in one month.          Meds ordered this encounter  Medications   hydrOXYzine (ATARAX) 25 MG tablet    Sig: Take 1 tablet (25 mg total) by mouth 2 (two) times daily as needed for itching.    Dispense:  60 tablet    Refill:  1    Follow-up: Return in about 1 month (around 12/20/2022) for Pap smear.       Hoy Register, MD, FAAFP. [facility] [place], [place] [phone]   11/19/2022, 10:18 AM

## 2022-11-20 ENCOUNTER — Other Ambulatory Visit: Payer: Self-pay | Admitting: Family Medicine

## 2022-11-20 DIAGNOSIS — R7989 Other specified abnormal findings of blood chemistry: Secondary | ICD-10-CM

## 2022-11-28 ENCOUNTER — Ambulatory Visit (HOSPITAL_COMMUNITY)
Admission: RE | Admit: 2022-11-28 | Discharge: 2022-11-28 | Disposition: A | Discharge status: Home / Self Care | Payer: Self-pay | Source: Ambulatory Visit | Attending: Family Medicine | Admitting: Family Medicine

## 2022-11-28 DIAGNOSIS — N926 Irregular menstruation, unspecified: Secondary | ICD-10-CM | POA: Insufficient documentation

## 2022-11-29 ENCOUNTER — Other Ambulatory Visit: Payer: Self-pay | Admitting: Internal Medicine

## 2022-11-29 DIAGNOSIS — N8500 Endometrial hyperplasia, unspecified: Secondary | ICD-10-CM

## 2022-11-29 DIAGNOSIS — N926 Irregular menstruation, unspecified: Secondary | ICD-10-CM

## 2022-11-29 DIAGNOSIS — D219 Benign neoplasm of connective and other soft tissue, unspecified: Secondary | ICD-10-CM

## 2022-12-03 ENCOUNTER — Ambulatory Visit (HOSPITAL_COMMUNITY)
Admission: RE | Admit: 2022-12-03 | Discharge: 2022-12-03 | Disposition: A | Discharge status: Home / Self Care | Payer: Self-pay | Source: Ambulatory Visit | Attending: Family Medicine | Admitting: Family Medicine

## 2022-12-03 DIAGNOSIS — R7989 Other specified abnormal findings of blood chemistry: Secondary | ICD-10-CM | POA: Insufficient documentation

## 2022-12-06 ENCOUNTER — Telehealth: Payer: Self-pay

## 2022-12-06 NOTE — Telephone Encounter (Signed)
Using [employer] [phone].  Pt given ultrasound results per notes of Dr. Alvis Lemmings on 12/05/22. Pt verbalized understanding.  Jaime Register, MD 12/05/2022  1:18 PM EDT     Please inform her that liver ultrasound reveals fatty deposit on the liver. Advise to work On low cholesterol diet.

## 2022-12-26 ENCOUNTER — Ambulatory Visit: Payer: Self-pay | Attending: Family Medicine | Admitting: Family Medicine

## 2022-12-26 ENCOUNTER — Other Ambulatory Visit (HOSPITAL_COMMUNITY)
Admission: RE | Admit: 2022-12-26 | Discharge: 2022-12-26 | Disposition: A | Discharge status: Home / Self Care | Payer: Self-pay | Source: Ambulatory Visit | Attending: Family Medicine | Admitting: Family Medicine

## 2022-12-26 ENCOUNTER — Encounter: Payer: Self-pay | Admitting: Family Medicine

## 2022-12-26 VITALS — BP 114/74 | HR 77 | Ht 59.0 in | Wt 163.8 lb

## 2022-12-26 DIAGNOSIS — Z124 Encounter for screening for malignant neoplasm of cervix: Secondary | ICD-10-CM

## 2022-12-26 DIAGNOSIS — Z0000 Encounter for general adult medical examination without abnormal findings: Secondary | ICD-10-CM

## 2022-12-26 DIAGNOSIS — Z1231 Encounter for screening mammogram for malignant neoplasm of breast: Secondary | ICD-10-CM

## 2022-12-26 NOTE — Patient Instructions (Signed)
Mantenimiento de Radiographer, therapeutic en las mujeres Health Maintenance, Female Adoptar un estilo de vida saludable y recibir atencin preventiva son importantes para promover la salud y Counsellor. Consulte al mdico sobre: El esquema adecuado para hacerse pruebas y exmenes peridicos. Cosas que puede hacer por su cuenta para prevenir enfermedades y [place] sano. Qu debo saber sobre la dieta, el peso y el ejercicio? Consuma una dieta saludable  Consuma una dieta que incluya muchas verduras, frutas, productos lcteos con bajo contenido de [place] y Associate Professor. No consuma muchos alimentos ricos en grasas slidas, azcares agregados o sodio. Mantenga un peso saludable El ndice de masa muscular [facility]) se [place] para identificar problemas de [place]. Proporciona una estimacin de la grasa corporal basndose en el peso y la altura. Su mdico puede ayudarle a Engineer, site [facility] y a Personnel officer o Pharmacologist un peso saludable. Haga ejercicio con regularidad Haga ejercicio con regularidad. Esta es una de las prcticas ms importantes que puede hacer por su salud. La [employer] de los adultos deben seguir estas pautas: Education officer, environmental, al menos, 150 minutos de actividad fsica por semana. El ejercicio debe aumentar la frecuencia cardaca y Media planner transpirar (ejercicio de intensidad moderada). Hacer ejercicios de fortalecimiento por lo [employer] por semana. Agregue esto a su plan de ejercicio de intensidad moderada. Pase menos tiempo sentada. Incluso la actividad fsica ligera puede ser beneficiosa. Controle sus niveles de colesterol y lpidos en la sangre Comience a realizarse anlisis de lpidos y Oncologist en la sangre a los 20 aos y luego reptalos cada 5 aos. Hgase controlar los niveles de colesterol con mayor frecuencia si: Sus niveles de lpidos y colesterol son altos. Es mayor de 40 aos. Presenta un alto riesgo de padecer enfermedades cardacas. Qu debo saber sobre las pruebas de deteccin del  cncer? Segn su historia clnica y sus antecedentes familiares, es posible que deba realizarse pruebas de deteccin del cncer en diferentes edades. Esto puede incluir pruebas de deteccin de lo siguiente: Cncer de mama. Cncer de cuello uterino. Cncer colorrectal. Cncer de piel. Cncer de pulmn. Qu debo saber sobre la enfermedad cardaca, la diabetes y la hipertensin arterial? Presin arterial y enfermedad cardaca La hipertensin arterial causa enfermedades cardacas y [place] el riesgo de accidente cerebrovascular. Es ms probable que esto se manifieste en las personas que tienen lecturas de presin arterial alta o tienen sobrepeso. Hgase controlar la presin arterial: Cada 3 a 5 aos si tiene entre 18 y 45 aos. Todos los aos si es mayor de 40 aos. Diabetes Realcese exmenes de deteccin de la diabetes con regularidad. Este anlisis revisa el nivel de azcar en la sangre en [place]. Hgase las pruebas de deteccin: Cada tres aos despus de los 40 aos de edad si tiene un peso normal y un bajo riesgo de padecer diabetes. Con ms frecuencia y a partir de [place] edad inferior si tiene sobrepeso o un alto riesgo de padecer diabetes. Qu debo saber sobre la prevencin de infecciones? Hepatitis B Si tiene un riesgo ms alto de contraer hepatitis B, debe someterse a un examen de deteccin de este virus. Hable con el mdico para averiguar si tiene riesgo de contraer la infeccin por hepatitis B. Hepatitis C Se recomienda el anlisis a: [employer] 1946 y 1965. Todas las personas que tengan un riesgo de haber contrado hepatitis C. Enfermedades de transmisin sexual (ETS) Hgase las pruebas de Airline pilot de ITS, incluidas la gonorrea y la clamidia, si: Es sexualmente activa y es Adult nurse de 24  aos. Es mayor de 24 aos, y el mdico le informa que corre riesgo de tener este tipo de infecciones. La actividad sexual ha cambiado desde que le hicieron la ltima prueba de  deteccin y tiene un riesgo mayor de Warehouse manager clamidia o Copy. Pregntele al mdico si usted tiene riesgo. Pregntele al mdico si usted tiene un alto riesgo de Primary school teacher VIH. El mdico tambin puede recomendarle un medicamento recetado para ayudar a evitar la infeccin por el VIH. Si elige tomar medicamentos para prevenir el VIH, primero debe [employer] de deteccin del VIH. Luego debe hacerse anlisis cada 3 meses mientras est tomando los medicamentos. Embarazo Si est por dejar de Armed forces training and education officer (fase premenopusica) y usted puede quedar [place], busque asesoramiento antes de [place]. [place] de 400 a 800 microgramos (mcg) de cido [employer] si [place]. Pida mtodos de control de la natalidad (anticonceptivos) si desea evitar un embarazo no deseado. Osteoporosis y [place] La osteoporosis es una enfermedad en la que los huesos pierden los minerales y la fuerza por el avance de la edad. El resultado pueden ser fracturas en los [place]. Si tiene 65 aos o ms, o si est en riesgo de sufrir osteoporosis y fracturas, pregunte a su mdico si debe: Hacerse pruebas de deteccin de prdida sea. Tomar un suplemento de calcio o de vitamina D para reducir el riesgo de fracturas. Recibir terapia de reemplazo hormonal (TRH) para tratar los sntomas de la menopausia. Siga estas indicaciones en su casa: Consumo de alcohol No beba alcohol si: Su mdico le indica no hacerlo. Est embarazada, puede estar embarazada o est tratando de [place]. Si bebe alcohol: Limite la cantidad que bebe a lo siguiente: De 0 a 1 bebida por da. Sepa cunta cantidad de alcohol hay en las bebidas que toma. En los [address], una medida equivale a una botella de cerveza de 12 oz (355 ml), un vaso de vino de 5 oz (148 ml) o un vaso de una bebida alcohlica de alta graduacin de 1 oz (44 ml). Estilo de vida No consuma ningn producto que contenga nicotina o tabaco. Estos  productos incluyen cigarrillos, tabaco para Theatre manager y aparatos de vapeo, como los Administrator, Civil Service. Si necesita ayuda para dejar de consumir estos productos, consulte al mdico. No consuma drogas. No comparta agujas. Solicite ayuda a su mdico si necesita apoyo o informacin para abandonar las drogas. Indicaciones generales Realcese los estudios de rutina de [address], dentales y de Wellsite geologist. Mantngase al da con las vacunas. Infrmele a su mdico si: Se siente deprimida con frecuencia. Alguna vez ha sido vctima de [place] o no se siente seguro en su casa. Resumen Adoptar un estilo de vida saludable y recibir atencin preventiva son importantes para promover la salud y Counsellor. Siga las instrucciones del mdico acerca de una dieta saludable, el ejercicio y la realizacin de pruebas o exmenes para Hotel manager. Siga las instrucciones del mdico con respecto al control del colesterol y la presin arterial. Esta informacin no tiene Theme park manager el consejo del mdico. Asegrese de hacerle al mdico cualquier pregunta que tenga. Document Revised: 09/01/2020 Document Reviewed: 09/01/2020 Elsevier Patient Education  2024 [employer].

## 2022-12-26 NOTE — Progress Notes (Signed)
Subjective:  Patient ID: [name], female    DOB: 02-08-1981  Age: 42 y.o. MRN: [id number]  CC: Gynecologic Exam   HPI [name] is a 42 y.o. year old female here for gynecologic exam.  Interval History: Discussed the use of AI scribe software for clinical note transcription with the patient, who gave verbal consent to proceed.   The patient presents for an annual physical exam, including a Pap smear. She reports no regular exercise, and her diet includes two servings of fruit daily, one in the morning and one in the afternoon, along with salads and vegetables. She last saw a dentist during her last pregnancy, but has not maintained regular dental visits since. She denies any current symptoms or concerns.  The patient's medical history includes a previous pregnancy and a history of smoking for a few months as a teenager. She has no known family history of heart disease.        Past Medical History:  Diagnosis Date   Gestational diabetes    Medical history non-contributory     Past Surgical History:  Procedure Laterality Date   APPENDECTOMY     CHOLECYSTECTOMY  12/15/2014   Procedure: LAPAROSCOPIC CHOLECYSTECTOMY, attempted cholangiogram;  Surgeon: Violeta Gelinas, MD;  Location: [facility];  Service: General;;   LAPAROSCOPIC APPENDECTOMY  04/05/2012   Procedure: APPENDECTOMY LAPAROSCOPIC;  Surgeon: Lodema Pilot, DO;  Location: [facility];  Service: General;  Laterality: N/A;    Family History  Problem Relation Age of Onset   Stroke Neg Hx    Hypertension Neg Hx    Diabetes Neg Hx     Social History   Socioeconomic History   Marital status: Significant Other    Spouse name: Not on file   Number of children: Not on file   Years of education: Not on file   Highest education level: Not on file  Occupational History   Not on file  Tobacco Use   Smoking status: Never   Smokeless tobacco: Never  Vaping Use   Vaping status: Never Used  Substance and  Sexual Activity   Alcohol use: No   Drug use: No   Sexual activity: Yes    Birth control/protection: None  Other Topics Concern   Not on file  Social History Narrative   Not on file   Social Determinants of Health   Financial Resource Strain: Not on file  Food Insecurity: Not on file  Transportation Needs: Not on file  Physical Activity: Not on file  Stress: Not on file  Social Connections: Not on file    No Known Allergies  Outpatient Medications Prior to Visit  Medication Sig Dispense Refill   hydrOXYzine (ATARAX) 25 MG tablet Take 1 tablet (25 mg total) by mouth 2 (two) times daily as needed for itching. 60 tablet 1   methocarbamol (ROBAXIN) 500 MG tablet Take 1 tablet (500 mg total) by mouth 2 (two) times daily. (Patient not taking: Reported on 12/26/2022) 20 tablet 0   methylPREDNISolone (MEDROL DOSEPAK) 4 MG TBPK tablet Take as directed (Patient not taking: Reported on 12/26/2022) 1 each 0   No facility-administered medications prior to visit.     ROS Review of Systems  Constitutional:  Negative for activity change and appetite change.  HENT:  Negative for sinus pressure and sore throat.   Respiratory:  Negative for chest tightness, shortness of breath and wheezing.   Cardiovascular:  Negative for chest pain and palpitations.  Gastrointestinal:  Negative for abdominal  distention, abdominal pain and constipation.  Genitourinary: Negative.   Musculoskeletal: Negative.   Psychiatric/Behavioral:  Negative for behavioral problems and dysphoric mood.     Objective:  BP 114/74   Pulse 77   Ht 4\' 11"  (1.499 m)   Wt 163 lb 12.8 oz (74.3 kg)   SpO2 99%   BMI 33.08 kg/m      12/26/2022    9:18 AM 11/19/2022    9:06 AM 09/19/2022    9:10 AM  BP/Weight  Systolic BP 114 126 133  Diastolic BP 74 77 82  Wt. (Lbs) 163.8 165.4 156.53  BMI 33.08 kg/m2 33.41 kg/m2 31.61 kg/m2      Physical Exam Exam conducted with a chaperone present.  Constitutional:      General:  She is not in acute distress.    Appearance: She is well-developed. She is not diaphoretic.  HENT:     Head: Normocephalic.     Right Ear: External ear normal.     Left Ear: External ear normal.     Nose: Nose normal.  Eyes:     Conjunctiva/sclera: Conjunctivae normal.     Pupils: Pupils are equal, round, and reactive to light.  Neck:     Vascular: No JVD.  Cardiovascular:     Rate and Rhythm: Normal rate and regular rhythm.     Heart sounds: Normal heart sounds. No murmur heard.    No gallop.  Pulmonary:     Effort: Pulmonary effort is normal. No respiratory distress.     Breath sounds: Normal breath sounds. No wheezing or rales.  Chest:     Chest wall: No tenderness.  Breasts:    Right: Normal. No mass, nipple discharge or tenderness.     Left: Normal. No mass, nipple discharge or tenderness.  Abdominal:     General: Bowel sounds are normal. There is no distension.     Palpations: Abdomen is soft. There is no mass.     Tenderness: There is no abdominal tenderness.     Hernia: There is no hernia in the left inguinal area or right inguinal area.  Genitourinary:    General: Normal vulva.     Pubic Area: No rash.      Labia:        Right: No rash.        Left: No rash.      Vagina: Normal.     Cervix: Normal.     Uterus: Normal.      Adnexa: Right adnexa normal and left adnexa normal.       Right: No tenderness.         Left: No tenderness.    Musculoskeletal:        General: No tenderness. Normal range of motion.     Cervical back: Normal range of motion. No tenderness.  Lymphadenopathy:     Upper Body:     Right upper body: No supraclavicular or axillary adenopathy.     Left upper body: No supraclavicular or axillary adenopathy.  Skin:    General: Skin is warm and dry.  Neurological:     Mental Status: She is alert and oriented to person, place, and time.     Deep Tendon Reflexes: Reflexes are normal and symmetric.        Latest Ref Rng & Units 11/19/2022    10:17 AM 05/29/2022    1:52 PM 06/15/2016    4:30 PM  CMP  Glucose 70 - 99 mg/dL 98  82  76   BUN 6 - 24 mg/dL 10  9  10    Creatinine 0.57 - 1.00 mg/dL 9.56  2.13  0.86   Sodium 134 - 144 mmol/L 138  137  138   Potassium 3.5 - 5.2 mmol/L 4.3  3.5  4.0   Chloride 96 - 106 mmol/L 104  104  104   CO2 20 - 29 mmol/L 21  23  24    Calcium 8.7 - 10.2 mg/dL 9.7  9.1  9.3   Total Protein 6.0 - 8.5 g/dL 7.3  7.6  7.7   Total Bilirubin 0.0 - 1.2 mg/dL 0.4  0.4  0.3   Alkaline Phos 44 - 121 IU/L 163  112  94   AST 0 - 40 IU/L 37  30  17   ALT 0 - 32 IU/L 41  40  16     Lipid Panel     Component Value Date/Time   CHOL 144 06/15/2016 1630   TRIG 85 06/15/2016 1630   HDL 35 (L) 06/15/2016 1630   CHOLHDL 4.1 06/15/2016 1630   VLDL 17 06/15/2016 1630   LDLCALC 92 06/15/2016 1630    CBC    Component Value Date/Time   WBC 7.4 11/19/2022 1017   WBC 7.9 05/29/2022 1352   RBC 4.70 11/19/2022 1017   RBC 4.32 05/29/2022 1352   HGB 10.8 (L) 11/19/2022 1017   HGB 11.2 07/27/2013 0000   HCT 34.9 11/19/2022 1017   HCT 34 07/27/2013 0000   PLT 329 11/19/2022 1017   PLT 273 05/18/2013 0000   MCV 74 (L) 11/19/2022 1017   MCH 23.0 (L) 11/19/2022 1017   MCH 24.3 (L) 05/29/2022 1352   MCHC 30.9 (L) 11/19/2022 1017   MCHC 31.1 05/29/2022 1352   RDW 16.6 (H) 11/19/2022 1017   LYMPHSABS 1.8 11/19/2022 1017   MONOABS 0.6 05/29/2022 1352   EOSABS 0.2 11/19/2022 1017   BASOSABS 0.0 11/19/2022 1017    Lab Results  Component Value Date   HGBA1C 5.5 11/19/2022    Assessment & Plan:      Annual Physical Exam Routine annual physical exam including Pap smear. No specific complaints or concerns reported. -Completed physical exam and Pap smear. -Results of Pap smear to be communicated early next week. Limited exercise and adequate fruit intake reported. Regular dental visits not current. -Recommended increasing exercise to 150 minutes per week. -Advised regular dental visits for preventive  care. -Patient consumed a small amount of cream in coffee prior to visit. -Ordered cholesterol check   Breast Cancer Screening No specific concerns reported. -Ordered mammogram for routine breast cancer screening.  Cervical Screening -Pap smear ordered     No orders of the defined types were placed in this encounter.   Follow-up: No follow-ups on file.       Hoy Register, MD, FAAFP. [facility] [place], [place] [phone]   12/26/2022, 10:39 AM

## 2022-12-27 LAB — LP+NON-HDL CHOLESTEROL
Cholesterol, Total: 140 mg/dL (ref 100–199)
HDL: 40 mg/dL (ref >39)
LDL Chol Calc (NIH): 85 mg/dL (ref 0–99)
Total Non-HDL-Chol (LDL+VLDL): 100 mg/dL (ref 0–129)
Triglycerides: 76 mg/dL (ref 0–149)
VLDL Cholesterol Cal: 15 mg/dL (ref 5–40)

## 2023-01-01 LAB — CYTOLOGY - PAP
Chlamydia: NEGATIVE
Comment: NEGATIVE
Comment: NEGATIVE
Comment: NEGATIVE
Comment: NORMAL
Diagnosis: NEGATIVE
High risk HPV: NEGATIVE
Neisseria Gonorrhea: NEGATIVE
Trichomonas: NEGATIVE

## 2023-01-15 ENCOUNTER — Other Ambulatory Visit: Payer: Self-pay | Admitting: Family Medicine

## 2023-01-15 NOTE — Telephone Encounter (Signed)
Requested Prescriptions  Pending Prescriptions Disp Refills   hydrOXYzine (ATARAX) 25 MG tablet [Pharmacy Med Name: hydrOXYzine HCl 25 MG Oral Tablet] 60 tablet 0    Sig: TAKE 1 TABLET BY MOUTH TWICE DAILY AS NEEDED FOR ITCHING     Ear, Nose, and Throat:  Antihistamines 2 Failed - 01/15/2023  6:23 AM      Failed - Cr in normal range and within 360 days    Creat  Date Value Ref Range Status  06/15/2016 0.86 0.50 - 1.10 mg/dL Final   Creatinine, Ser  Date Value Ref Range Status  11/19/2022 0.36 (L) 0.57 - 1.00 mg/dL Final         Passed - Valid encounter within last 12 months    Recent Outpatient Visits           2 weeks ago Annual physical exam   [place] [facility] Hoy Register, MD   1 month ago Screening for diabetes mellitus   [place] [facility] Hoy Register, MD   6 years ago Acute LUQ pain   [place] [facility] [place], [place] R, FNP   8 years ago Sciatic nerve pain, right   [facility] Ambrose Finland, NP

## 2023-02-26 ENCOUNTER — Other Ambulatory Visit: Payer: Self-pay

## 2023-02-26 ENCOUNTER — Encounter (HOSPITAL_COMMUNITY): Payer: Self-pay | Admitting: *Deleted

## 2023-02-26 ENCOUNTER — Ambulatory Visit (HOSPITAL_COMMUNITY)
Admission: EM | Admit: 2023-02-26 | Discharge: 2023-02-26 | Disposition: A | Discharge status: Home / Self Care | Payer: Self-pay | Attending: Internal Medicine | Admitting: Internal Medicine

## 2023-02-26 DIAGNOSIS — G44209 Tension-type headache, unspecified, not intractable: Secondary | ICD-10-CM

## 2023-02-26 DIAGNOSIS — Z862 Personal history of diseases of the blood and blood-forming organs and certain disorders involving the immune mechanism: Secondary | ICD-10-CM

## 2023-02-26 DIAGNOSIS — R002 Palpitations: Secondary | ICD-10-CM

## 2023-02-26 LAB — BASIC METABOLIC PANEL
Anion gap: 10 (ref 5–15)
BUN: 10 mg/dL (ref 6–20)
CO2: 23 mmol/L (ref 22–32)
Calcium: 9.3 mg/dL (ref 8.9–10.3)
Chloride: 108 mmol/L (ref 98–111)
Creatinine, Ser: 0.47 mg/dL (ref 0.44–1.00)
GFR, Estimated: >60 mL/min (ref >60)
Glucose, Bld: 81 mg/dL (ref 70–99)
Potassium: 3.9 mmol/L (ref 3.5–5.1)
Sodium: 141 mmol/L (ref 135–145)

## 2023-02-26 LAB — CBC
HCT: 36.7 % (ref 36.0–46.0)
Hemoglobin: 11.4 g/dL — ABNORMAL LOW (ref 12.0–15.0)
MCH: 23.9 pg — ABNORMAL LOW (ref 26.0–34.0)
MCHC: 31.1 g/dL (ref 30.0–36.0)
MCV: 77.1 fL — ABNORMAL LOW (ref 80.0–100.0)
Platelets: 411 10*3/uL — ABNORMAL HIGH (ref 150–400)
RBC: 4.76 MIL/uL (ref 3.87–5.11)
RDW: 15.4 % (ref 11.5–15.5)
WBC: 10.8 10*3/uL — ABNORMAL HIGH (ref 4.0–10.5)
nRBC: 0 % (ref 0.0–0.2)

## 2023-02-26 LAB — T4, FREE: Free T4: 1.48 ng/dL — ABNORMAL HIGH (ref 0.61–1.12)

## 2023-02-26 LAB — TSH: TSH: <0.01 u[IU]/mL — ABNORMAL LOW (ref 0.350–4.500)

## 2023-02-26 LAB — MAGNESIUM: Magnesium: 2.1 mg/dL (ref 1.7–2.4)

## 2023-02-26 MED ORDER — ACETAMINOPHEN 325 MG PO TABS
ORAL_TABLET | ORAL | Status: AC
  Filled 2023-02-26: qty 2

## 2023-02-26 MED ORDER — ACETAMINOPHEN 325 MG PO TABS
650.0000 mg | ORAL_TABLET | Freq: Once | ORAL | Status: AC
  Administered 2023-02-26: 650 mg via ORAL

## 2023-02-26 NOTE — Discharge Instructions (Addendum)
Nosotros te llamamos si tus resultados son abnormales

## 2023-02-26 NOTE — ED Provider Notes (Addendum)
[facility]    CSN: [id number] Arrival date & time: 02/26/23  1111      History   Chief Complaint Chief Complaint  Patient presents with   Chest Pain   Headache    HPI Khamila Brydon Serita Grit is a 42 y.o. female presents due to having rapid heart beat 3 days ago which lasted for 48 hours and finally resolved last night. While having it she was just resting when it first started, but did not have chest pains, SOB or sweating. When she had it laying down, would feel light headed. She did not seek medical care. States the palpitations would last for 5 minutes and stop and then come back again. Today has a HA on her temples.  She has hx of anemia and has not been taking her Fe for the past 3 months. She has wellness check with her PCP in August and her labs showed mild anemia, and elevated liver function and had liver ultrasound done and showed changes consistent with fatty liver. She admits of fatigue, and hair loss. Denies wt gain, constipation or insomnia.      Past Medical History:  Diagnosis Date   Gestational diabetes    Medical history non-contributory     Patient Active Problem List   Diagnosis Date Noted   Palpitations 02/26/2023   Language barrier 11/11/2019    Past Surgical History:  Procedure Laterality Date   APPENDECTOMY     CHOLECYSTECTOMY  12/15/2014   Procedure: LAPAROSCOPIC CHOLECYSTECTOMY, attempted cholangiogram;  Surgeon: Violeta Gelinas, MD;  Location: [facility];  Service: General;;   LAPAROSCOPIC APPENDECTOMY  04/05/2012   Procedure: APPENDECTOMY LAPAROSCOPIC;  Surgeon: Lodema Pilot, DO;  Location: [facility];  Service: General;  Laterality: N/A;    OB History     Gravida  7   Para  6   Term  4   Preterm  2   AB  1   Living  5      SAB  1   IAB  0   Ectopic  0   Multiple  0   Live Births  5            Home Medications    Prior to Admission medications   Not on File    Family History Family History  Problem Relation  Age of Onset   Stroke Neg Hx    Hypertension Neg Hx    Diabetes Neg Hx     Social History Social History   Tobacco Use   Smoking status: Never   Smokeless tobacco: Never  Vaping Use   Vaping status: Never Used  Substance Use Topics   Alcohol use: No   Drug use: No     Allergies   Patient has no known allergies.   Review of Systems Review of Systems As noted in HPI  Physical Exam Triage Vital Signs ED Triage Vitals  Encounter Vitals Group     BP 02/26/23 1126 127/84     Systolic BP Percentile --      Diastolic BP Percentile --      Pulse Rate 02/26/23 1126 91     Resp 02/26/23 1126 18     Temp 02/26/23 1126 98.5 F (36.9 C)     Temp src --      SpO2 02/26/23 1126 100 %     Weight --      Height --      Head Circumference --      Peak  Flow --      Pain Score 02/26/23 1129 0     Pain Loc --      Pain Education --      Exclude from Growth Chart --    No data found.  Updated Vital Signs BP 127/84   Pulse 91   Temp 98.5 F (36.9 C)   Resp 18   LMP 02/25/2023   SpO2 100%   Visual Acuity Right Eye Distance:   Left Eye Distance:   Bilateral Distance:    Right Eye Near:   Left Eye Near:    Bilateral Near:     Physical Exam Physical Exam Vitals signs and nursing note reviewed.  Constitutional:      General: She is not in acute distress.    Appearance:She is well-developed and normal weight. He is not ill-appearing, toxic-appearing or diaphoretic.  HENT:     Head: Normocephalic. Has tender temples bilaterally and mild on TMJ area Eyes:     Extraocular Movements: Extraocular movements intact.     Pupils: Pupils are equal, round, and reactive to light.  Neck:     Musculoskeletal: Neck supple. No neck rigidity.     Meningeal: Brudzinski's sign absent.  Cardiovascular:     Rate and Rhythm: Normal rate and regular rhythm.     Heart sounds: No murmur.  Pulmonary:     Effort: Pulmonary effort is normal.     Breath sounds: Normal breath sounds. No  wheezing, rhonchi or rales.   Musculoskeletal: Normal range of motion.  Lymphadenopathy:     Cervical: No cervical adenopathy.  Skin:    General: Skin is warm and dry.  Neurological:     Mental Status: He is alert.     Cranial Nerves: No cranial nerve deficit or facial asymmetry.     Sensory: No sensory deficit.     Motor: No weakness.     Coordination: Romberg sign negative. Coordination normal.     Gait: Gait normal.     Deep Tendon Reflexes: Reflexes normal.     Comments: Normal Romberg, propioception, finger to nose, tandem gait.   Psychiatric:        Mood and Affect: Mood normal.        Speech: Speech normal.        Behavior: Behavior normal.    UC Treatments / Results  Labs (all labs ordered are listed, but only abnormal results are displayed) Labs Reviewed  CBC  TSH  T4, FREE  MAGNESIUM  BASIC METABOLIC PANEL    EKG NSR, normal EKG  Radiology No results found.  Procedures Procedures (including critical care time)  Medications Ordered in UC Medications  acetaminophen (TYLENOL) tablet 650 mg (650 mg Oral Given 02/26/23 1212)    Initial Impression / Assessment and Plan / UC Course  I have reviewed the triage vital signs and the nursing notes. Palpitations resolved Tension HA  She was given Tylenol as noted I ordered thyroid studies since she never had this done in the past per records I reviewed, CBC and CMP. We will inform her of the results if abnormal.      Final Clinical Impressions(s) / UC Diagnoses   Final diagnoses:  Palpitations  History of iron deficiency anemia  Tension headache     Discharge Instructions      Nosotros te llamamos si tus resultados son abnormales      ED Prescriptions   None    PDMP not reviewed this encounter.   Rodriguez-Southworth, Nettie Elm,  PA-C 02/26/23 1216    Rodriguez-Southworth, Ray, PA-C 02/26/23 1220

## 2023-02-26 NOTE — ED Triage Notes (Signed)
Pt reports she felt her Heart beating fast on SAt. Pt now has a HA.

## 2023-02-28 ENCOUNTER — Telehealth: Payer: Self-pay

## 2023-02-28 NOTE — Telephone Encounter (Signed)
ATC pt  x 1 attempt. Left VM via Spanish interpreter. To adv pt per Sharin Mons, PA "Thyroid labs appears consistent with possible hyperthyroidism. she needs to follow up with her PCP this week. slightly anemic, but better than it was previously.  she would also benefit in seeing a endocrinologist "

## 2023-03-05 ENCOUNTER — Encounter: Payer: Self-pay | Admitting: Family Medicine

## 2023-03-05 ENCOUNTER — Ambulatory Visit: Payer: Self-pay | Attending: Family Medicine | Admitting: Family Medicine

## 2023-03-05 VITALS — BP 118/81 | HR 96 | Ht 59.0 in | Wt 164.4 lb

## 2023-03-05 DIAGNOSIS — R002 Palpitations: Secondary | ICD-10-CM

## 2023-03-05 DIAGNOSIS — R7989 Other specified abnormal findings of blood chemistry: Secondary | ICD-10-CM

## 2023-03-05 NOTE — Patient Instructions (Signed)
Palpitaciones Palpitations Las palpitaciones son sensaciones de que su latido cardaco es irregular o ms rpido de lo normal. Se siente como un aleteo o que falta un latido. Las causas de las palpitaciones pueden ser diversas, entre ellas fumar, la cafena, el alcohol, el estrs y determinados medicamentos o frmacos. La mayora de las causas de las palpitaciones no son graves.  Sin embargo, algunas palpitaciones pueden ser un signo de un problema grave. Se realizarn ms pruebas y se obtendrn antecedentes mdicos exhaustivos para encontrar la causa de sus palpitaciones. El mdico puede solicitar pruebas como un ECG, pruebas de laboratorio, un ecocardiograma o un monitor continuo de ECG continuo. Siga estas instrucciones en su casa: Est atento a cualquier cambio en los sntomas. Informe a su mdico acerca de sus sntomas. Tome estas medidas para ayudar a Chief Operating Officer sus sntomas: Comida y bebida Siga las instrucciones del mdico respecto de las restricciones en las comidas o las bebidas. Es posible que tenga que evitar los alimentos y las bebidas que pueden provocar palpitaciones. Pueden incluir: Caf, t, refrescos y bebidas energizantes con cafena. Chocolate. Alcohol. Pastillas para adelgazar. Estilo de vida     [address] para reducir los niveles de estrs y la ansiedad. Algunas cosas que pueden ayudarlo a relajarse son: Yoga. Actividades para el cuerpo y la mente, como respiracin profunda, meditacin o usar palabras e imgenes para crear pensamientos positivos (visualizacin guiada). Actividad fsica como natacin, trote o caminatas. Informe a su mdico si las palpitaciones aumentan con la [place]. Si la [employer] de pecho o falta de aire, no contine la actividad hasta que su mdico lo examine. Biorretroalimentacin. Este es un mtodo que le ensea a usar la mente para Chief Operating Officer cosas del cuerpo, como el latido cardaco. Descanse y duerma lo suficiente. Mantenga un  horario habitual para acostarse. No consuma drogas, entre ellas la cocana o el xtasis. No consuma marihuana. No consuma ningn producto que contenga nicotina o tabaco. Estos productos incluyen cigarrillos, tabaco para Theatre manager y aparatos de vapeo, como los Administrator, Civil Service. Si necesita ayuda para dejar de fumar, consulte al mdico. Instrucciones generales Use los medicamentos de venta libre y los recetados solamente como se lo haya indicado el mdico. Oceanographer a todas las visitas de seguimiento. Esto es importante. Estas pueden incluir visitas para realizarle ms pruebas si las palpitaciones no desaparecen o empeoran. Comunquese con un mdico si: Sigue teniendo latidos cardacos rpidos o irregulares durante un largo perodo de [employer]. Observa que las palpitaciones ocurren con ms frecuencia. Solicite ayuda de inmediato si: Siente falta de aire o dolor en el pecho. Siente un dolor de cabeza intenso. Se siente mareado o se desmaya. Estos sntomas pueden representar un problema grave que constituye Radio broadcast assistant. No espere a ver si los sntomas desaparecen. Solicite atencin mdica de inmediato. Comunquese con el servicio de emergencias de su localidad (911 en los [place]). No conduzca por sus propios medios [employer] hospital. Resumen Las palpitaciones son sensaciones de que su latido cardaco es irregular o ms rpido de lo normal. Se siente como un aleteo o que falta un latido. Las causas de las palpitaciones pueden ser diversas, entre ellas, el estrs y el consumo de cigarrillos, cafena, alcohol y determinadas drogas. Se podrn realizar ms pruebas y obtener antecedentes mdicos exhaustivos para encontrar la causa de sus palpitaciones. Obtenga ayuda de inmediato si se desmaya o tiene dolor de [place], falta de [place], dolor de cabeza intenso o [employer]. Esta informacin no tiene Theme park manager el consejo  del mdico. Asegrese de hacerle al mdico cualquier pregunta que  tenga. Document Revised: 09/12/2020 Document Reviewed: 09/12/2020 Elsevier Patient Education  2024 [employer].

## 2023-03-05 NOTE — Progress Notes (Signed)
Subjective:  Patient ID: [name], female    DOB: 1980-10-11  Age: 42 y.o. MRN: [id number]  CC: Hospitalization Follow-up   HPI [name] is a 43 y.o. year old female who presents for an ED follow-up visit.  Interval History: Discussed the use of AI scribe software for clinical note transcription with the patient, who gave verbal consent to proceed.  She presents for follow-up after a recent emergency room visit for general. She reports that the palpitations have since resolved. The episode of palpitations began on a Friday night and persisted until the following Tuesday when she sought care at the emergency room. During the episode, she was at rest and experienced a sudden onset of rapid, strong heart palpitations. She also reported a headache. Since the emergency room visit, she has not had any repeat episodes of palpitations.  During the emergency room visit, blood tests were performed which were normal except for an abnormal thyroid test suggestive of hyperthyroidism. The patient was informed of this result via a message.  Her EKG was normal.  She denies a family history of thyroid disorders.        Past Medical History:  Diagnosis Date   Gestational diabetes    Medical history non-contributory     Past Surgical History:  Procedure Laterality Date   APPENDECTOMY     CHOLECYSTECTOMY  12/15/2014   Procedure: LAPAROSCOPIC CHOLECYSTECTOMY, attempted cholangiogram;  Surgeon: Violeta Gelinas, MD;  Location: [facility];  Service: General;;   LAPAROSCOPIC APPENDECTOMY  04/05/2012   Procedure: APPENDECTOMY LAPAROSCOPIC;  Surgeon: Lodema Pilot, DO;  Location: [facility];  Service: General;  Laterality: N/A;    Family History  Problem Relation Age of Onset   Stroke Neg Hx    Hypertension Neg Hx    Diabetes Neg Hx     Social History   Socioeconomic History   Marital status: Significant Other    Spouse name: Not on file   Number of children: Not on file   Years of  education: Not on file   Highest education level: Not on file  Occupational History   Not on file  Tobacco Use   Smoking status: Never   Smokeless tobacco: Never  Vaping Use   Vaping status: Never Used  Substance and Sexual Activity   Alcohol use: No   Drug use: No   Sexual activity: Yes    Birth control/protection: None  Other Topics Concern   Not on file  Social History Narrative   Not on file   Social Determinants of Health   Financial Resource Strain: Low Risk  (03/05/2023)   Overall Financial Resource Strain (CARDIA)    Difficulty of Paying Living Expenses: Not hard at all  Food Insecurity: No Food Insecurity (03/05/2023)   Hunger Vital Sign    Worried About Running Out of Food in the Last Year: Never true    Ran Out of Food in the Last Year: Never true  Transportation Needs: No Transportation Needs (03/05/2023)   PRAPARE - Administrator, Civil Service (Medical): No    Lack of Transportation (Non-Medical): No  Physical Activity: Inactive (03/05/2023)   Exercise Vital Sign    Days of Exercise per Week: 0 days    Minutes of Exercise per Session: 0 min  Stress: No Stress Concern Present (03/05/2023)   [employer] of Occupational Health - Occupational Stress Questionnaire    Feeling of Stress : Not at all  Social Connections: Socially Integrated (03/05/2023)  Social Connection and Isolation Panel [NHANES]    Frequency of Communication with Friends and Family: More than three times a week    Frequency of Social Gatherings with Friends and Family: Three times a week    Attends Religious Services: 1 to 4 times per year    Active Member of Clubs or Organizations: Yes    Attends Banker Meetings: 1 to 4 times per year    Marital Status: Married    No Known Allergies  No outpatient medications prior to visit.   No facility-administered medications prior to visit.     ROS Review of Systems  Constitutional:  Negative for activity  change and appetite change.  HENT:  Negative for sinus pressure and sore throat.   Respiratory:  Negative for chest tightness, shortness of breath and wheezing.   Cardiovascular:  Positive for palpitations. Negative for chest pain.  Gastrointestinal:  Negative for abdominal distention, abdominal pain and constipation.  Genitourinary: Negative.   Musculoskeletal: Negative.   Psychiatric/Behavioral:  Negative for behavioral problems and dysphoric mood.     Objective:  BP 118/81   Pulse 96   Ht 4\' 11"  (1.499 m)   Wt 164 lb 6.4 oz (74.6 kg)   LMP 02/25/2023   SpO2 99%   BMI 33.20 kg/m      03/05/2023    4:13 PM 02/26/2023   11:26 AM 12/26/2022    9:18 AM  BP/Weight  Systolic BP 118 127 114  Diastolic BP 81 84 74  Wt. (Lbs) 164.4  163.8  BMI 33.2 kg/m2  33.08 kg/m2      Physical Exam Constitutional:      Appearance: She is well-developed.  Cardiovascular:     Rate and Rhythm: Normal rate.     Heart sounds: Normal heart sounds. No murmur heard. Pulmonary:     Effort: Pulmonary effort is normal.     Breath sounds: Normal breath sounds. No wheezing or rales.  Chest:     Chest wall: No tenderness.  Abdominal:     General: Bowel sounds are normal. There is no distension.     Palpations: Abdomen is soft. There is no mass.     Tenderness: There is no abdominal tenderness.  Musculoskeletal:        General: Normal range of motion.     Right lower leg: No edema.     Left lower leg: No edema.  Neurological:     Mental Status: She is alert and oriented to person, place, and time.  Psychiatric:        Mood and Affect: Mood normal.        Latest Ref Rng & Units 02/26/2023   12:04 PM 11/19/2022   10:17 AM 05/29/2022    1:52 PM  CMP  Glucose 70 - 99 mg/dL 81  98  82   BUN 6 - 20 mg/dL 10  10  9    Creatinine 0.44 - 1.00 mg/dL 5.57  3.22  0.25   Sodium 135 - 145 mmol/L 141  138  137   Potassium 3.5 - 5.1 mmol/L 3.9  4.3  3.5   Chloride 98 - 111 mmol/L 108  104  104   CO2  22 - 32 mmol/L 23  21  23    Calcium 8.9 - 10.3 mg/dL 9.3  9.7  9.1   Total Protein 6.0 - 8.5 g/dL  7.3  7.6   Total Bilirubin 0.0 - 1.2 mg/dL  0.4  0.4   Alkaline Phos  44 - 121 IU/L  163  112   AST 0 - 40 IU/L  37  30   ALT 0 - 32 IU/L  41  40     Lipid Panel     Component Value Date/Time   CHOL 140 12/26/2022 1012   TRIG 76 12/26/2022 1012   HDL 40 12/26/2022 1012   CHOLHDL 4.1 06/15/2016 1630   VLDL 17 06/15/2016 1630   LDLCALC 85 12/26/2022 1012    CBC    Component Value Date/Time   WBC 10.8 (H) 02/26/2023 1204   RBC 4.76 02/26/2023 1204   HGB 11.4 (L) 02/26/2023 1204   HGB 10.8 (L) 11/19/2022 1017   HGB 11.2 07/27/2013 0000   HCT 36.7 02/26/2023 1204   HCT 34.9 11/19/2022 1017   HCT 34 07/27/2013 0000   PLT 411 (H) 02/26/2023 1204   PLT 329 11/19/2022 1017   PLT 273 05/18/2013 0000   MCV 77.1 (L) 02/26/2023 1204   MCV 74 (L) 11/19/2022 1017   MCH 23.9 (L) 02/26/2023 1204   MCHC 31.1 02/26/2023 1204   RDW 15.4 02/26/2023 1204   RDW 16.6 (H) 11/19/2022 1017   LYMPHSABS 1.8 11/19/2022 1017   MONOABS 0.6 05/29/2022 1352   EOSABS 0.2 11/19/2022 1017   BASOSABS 0.0 11/19/2022 1017    Lab Results  Component Value Date   HGBA1C 5.5 11/19/2022   Lab Results  Component Value Date   TSH <0.010 (L) 02/26/2023     Assessment & Plan:      Possible Hyperthyroidism Initial blood tests from ER visit showed suppressed TSH. No current symptoms of palpitations. No family history of thyroid disorders. -Order repeat thyroid function tests to confirm initial findings. -If repeat tests confirm hyperthyroidism, initiate treatment and order thyroid ultrasound.  Palpitations Episode of palpitations occurred at rest, resolved on its own. No recurrent episodes since ER visit. EKG from ER visit was normal. -Underlying thyroid disorder could explain this if her repeat TSH is suppressed. -No immediate intervention required. Monitor for recurrence.          No orders of  the defined types were placed in this encounter.   Follow-up: Return in about 3 months (around 06/05/2023) for Chronic medical conditions.       Hoy Register, MD, FAAFP. [facility] [place], [place] [phone]   03/05/2023, 5:14 PM

## 2023-05-14 ENCOUNTER — Ambulatory Visit
Admission: EM | Admit: 2023-05-14 | Discharge: 2023-05-14 | Disposition: A | Discharge status: Home / Self Care | Payer: Self-pay | Attending: Family Medicine | Admitting: Family Medicine

## 2023-05-14 DIAGNOSIS — H66011 Acute suppurative otitis media with spontaneous rupture of ear drum, right ear: Secondary | ICD-10-CM

## 2023-05-14 MED ORDER — PREDNISONE 10 MG PO TABS: 10.0000 mg | ORAL_TABLET | Freq: Two times a day (BID) | ORAL | 0 refills | Status: AC

## 2023-05-14 MED ORDER — AMOXICILLIN 875 MG PO TABS: 875.0000 mg | ORAL_TABLET | Freq: Two times a day (BID) | ORAL | 0 refills | Status: AC

## 2023-05-14 NOTE — ED Provider Notes (Signed)
 [facility]    CSN: [id number] Arrival date & time: 05/14/23  1440      History   Chief Complaint Chief Complaint  Patient presents with   Otalgia    HPI [name] is a 43 y.o. female.   Patient here with right eat pain x 1 night. No associated URI. Patient  Endorses decreased hearing in right ear. She has taken ibuprofen  earlier this morning for pain. Past Medical History:  Diagnosis Date   Gestational diabetes    Medical history non-contributory     Patient Active Problem List   Diagnosis Date Noted   Palpitations 02/26/2023   Language barrier 11/11/2019    Past Surgical History:  Procedure Laterality Date   APPENDECTOMY     CHOLECYSTECTOMY  12/15/2014   Procedure: LAPAROSCOPIC CHOLECYSTECTOMY, attempted cholangiogram;  Surgeon: Dann Hummer, MD;  Location: [facility] OR;  Service: General;;   LAPAROSCOPIC APPENDECTOMY  04/05/2012   Procedure: APPENDECTOMY LAPAROSCOPIC;  Surgeon: Redell Faith, DO;  Location: [facility];  Service: General;  Laterality: N/A;    OB History     Gravida  7   Para  6   Term  4   Preterm  2   AB  1   Living  5      SAB  1   IAB  0   Ectopic  0   Multiple  0   Live Births  5            Home Medications    Prior to Admission medications   Medication Sig Start Date End Date Taking? Authorizing Provider  amoxicillin  (AMOXIL ) 875 MG tablet Take 1 tablet (875 mg total) by mouth 2 (two) times daily for 10 days. 05/14/23 05/24/23 Yes Arloa Suzen RAMAN, NP  predniSONE  (DELTASONE ) 10 MG tablet Take 1 tablet (10 mg total) by mouth 2 (two) times daily with a meal for 5 days. 05/14/23 05/19/23 Yes Arloa Suzen RAMAN, NP    Family History Family History  Problem Relation Age of Onset   Stroke Neg Hx    Hypertension Neg Hx    Diabetes Neg Hx     Social History Social History   Tobacco Use   Smoking status: Never   Smokeless tobacco: Never  Vaping Use   Vaping status: Never Used  Substance Use Topics    Alcohol use: No   Drug use: No     Allergies   Patient has no known allergies.   Review of Systems Review of Systems  HENT:  Positive for ear pain.      Physical Exam Triage Vital Signs ED Triage Vitals  Encounter Vitals Group     BP      Systolic BP Percentile      Diastolic BP Percentile      Pulse      Resp      Temp      Temp src      SpO2      Weight      Height      Head Circumference      Peak Flow      Pain Score      Pain Loc      Pain Education      Exclude from Growth Chart    No data found.  Updated Vital Signs BP 115/74 (BP Location: Right Arm)   Pulse 95   Temp 97.9 F (36.6 C) (Oral)   Resp 18   LMP 04/29/2023 (  Approximate)   SpO2 98%   Visual Acuity Right Eye Distance:   Left Eye Distance:   Bilateral Distance:    Right Eye Near:   Left Eye Near:    Bilateral Near:     Physical Exam Vitals reviewed.  Constitutional:      Appearance: Normal appearance.  HENT:     Head: Normocephalic and atraumatic.     Right Ear: External ear normal. Decreased hearing noted. Swelling and tenderness present. Tympanic membrane is erythematous and bulging.     Left Ear: Hearing, tympanic membrane and ear canal normal.  Cardiovascular:     Rate and Rhythm: Normal rate and regular rhythm.  Musculoskeletal:     Cervical back: Normal range of motion and neck supple.  Lymphadenopathy:     Cervical: Cervical adenopathy present.  Neurological:     Mental Status: She is alert.      UC Treatments / Results  Labs (all labs ordered are listed, but only abnormal results are displayed) Labs Reviewed - No data to display  EKG   Radiology No results found.  Procedures Procedures (including critical care time)  Medications Ordered in UC Medications - No data to display  Initial Impression / Assessment and Plan / UC Course  I have reviewed the triage vital signs and the nursing notes.  Pertinent labs & imaging results that were available  during my care of the patient were reviewed by me and considered in my medical decision making (see chart for details).    Non-recurrent Acute Suppurative Otitis Media without spontaneous rupture  -Amoxicillin  870 twice daily for 10 days -Prednisone  milligrams twice daily for 5 days Encourage patient to continue Tylenol  as needed for pain.  Return precautions if symptoms worsen or resolve with treatment. Final Clinical Impressions(s) / UC Diagnoses   Final diagnoses:  Non-recurrent acute suppurative otitis media of right ear with spontaneous rupture of tympanic membrane   Discharge Instructions   None    ED Prescriptions     Medication Sig Dispense Auth. Provider   amoxicillin  (AMOXIL ) 875 MG tablet Take 1 tablet (875 mg total) by mouth 2 (two) times daily for 10 days. 20 tablet Arloa Suzen RAMAN, NP   predniSONE  (DELTASONE ) 10 MG tablet Take 1 tablet (10 mg total) by mouth 2 (two) times daily with a meal for 5 days. 10 tablet Arloa Suzen RAMAN, NP      PDMP not reviewed this encounter.   Arloa Suzen RAMAN, NP 05/14/23 [phone]

## 2023-05-14 NOTE — ED Triage Notes (Signed)
Interpretor ID: [id number]  Pt presents with ear pain in right ear. Symptoms onset last night. She says it felt like her ear got blocked and then she started to feel the pain.

## 2023-06-05 ENCOUNTER — Ambulatory Visit: Payer: Self-pay | Attending: Family Medicine | Admitting: Family Medicine

## 2023-06-05 ENCOUNTER — Encounter: Payer: Self-pay | Admitting: Family Medicine

## 2023-06-05 ENCOUNTER — Other Ambulatory Visit: Payer: Self-pay

## 2023-06-05 VITALS — BP 117/76 | HR 98 | Ht 59.0 in | Wt 172.0 lb

## 2023-06-05 DIAGNOSIS — R7989 Other specified abnormal findings of blood chemistry: Secondary | ICD-10-CM

## 2023-06-05 NOTE — Progress Notes (Signed)
 Subjective:  Patient ID: [name], female    DOB: [birth year]  Age: 43 y.o. MRN: [id number]  CC: Medical Management of Chronic Issues   HPI: [name] is a 43 y.o. year old female with a medical history significant for suppressed TSH  Discussed the use of AI scribe software for clinical note transcription with the patient, who gave verbal consent to proceed.  History of Present Illness She presents for a follow-up visit. The sinus infection, treated at an urgent care facility two weeks ago, has resolved. The palpitations, reported during a visit in November, has also resolved. In November, a blood test revealed abnormal thyroid levels, suggesting hyperthyroidism. However, further testing was not completed at that time. The patient also reports a long-standing history of heavy menstrual periods and recent episodes of diarrhea.    Past Medical History:  Diagnosis Date   Gestational diabetes    Medical history non-contributory     Past Surgical History:  Procedure Laterality Date   APPENDECTOMY     CHOLECYSTECTOMY  12/15/2014   Procedure: LAPAROSCOPIC CHOLECYSTECTOMY, attempted cholangiogram;  Surgeon: Violeta Gelinas, MD;  Location: [facility];  Service: General;;   LAPAROSCOPIC APPENDECTOMY  04/05/2012   Procedure: APPENDECTOMY LAPAROSCOPIC;  Surgeon: Lodema Pilot, DO;  Location: [facility];  Service: General;  Laterality: N/A;    Family History  Problem Relation Age of Onset   Stroke Neg Hx    Hypertension Neg Hx    Diabetes Neg Hx     Social History   Socioeconomic History   Marital status: Significant Other    Spouse name: Not on file   Number of children: Not on file   Years of education: Not on file   Highest education level: Not on file  Occupational History   Not on file  Tobacco Use   Smoking status: Never   Smokeless tobacco: Never  Vaping Use   Vaping status: Never Used  Substance and Sexual Activity   Alcohol use: No   Drug use: No    Sexual activity: Yes    Birth control/protection: None  Other Topics Concern   Not on file  Social History Narrative   Not on file   Social Drivers of Health   Financial Resource Strain: Low Risk  (03/05/2023)   Overall Financial Resource Strain (CARDIA)    Difficulty of Paying Living Expenses: Not hard at all  Food Insecurity: No Food Insecurity (03/05/2023)   Hunger Vital Sign    Worried About Running Out of Food in the Last Year: Never true    Ran Out of Food in the Last Year: Never true  Transportation Needs: No Transportation Needs (03/05/2023)   PRAPARE - Administrator, Civil Service (Medical): No    Lack of Transportation (Non-Medical): No  Physical Activity: Inactive (03/05/2023)   Exercise Vital Sign    Days of Exercise per Week: 0 days    Minutes of Exercise per Session: 0 min  Stress: No Stress Concern Present (03/05/2023)   [employer] of Occupational Health - Occupational Stress Questionnaire    Feeling of Stress : Not at all  Social Connections: Socially Integrated (03/05/2023)   Social Connection and Isolation Panel [NHANES]    Frequency of Communication with Friends and Family: More than three times a week    Frequency of Social Gatherings with Friends and Family: Three times a week    Attends Religious Services: 1 to 4 times per year    Active Member of  Clubs or Organizations: Yes    Attends Banker Meetings: 1 to 4 times per year    Marital Status: Married    No Known Allergies  No outpatient medications prior to visit.   No facility-administered medications prior to visit.     ROS Review of Systems  Constitutional:  Negative for activity change and appetite change.  HENT:  Negative for sinus pressure and sore throat.   Respiratory:  Negative for chest tightness, shortness of breath and wheezing.   Cardiovascular:  Negative for chest pain and palpitations.  Gastrointestinal:  Negative for abdominal distention,  abdominal pain and constipation.  Genitourinary:  Positive for menstrual problem.  Musculoskeletal: Negative.   Psychiatric/Behavioral:  Negative for behavioral problems and dysphoric mood.     Objective:  BP 117/76   Pulse 98   Ht 4\' 11"  (1.499 m)   Wt 172 lb (78 kg)   LMP 04/29/2023 (Approximate)   SpO2 98%   BMI 34.74 kg/m      06/05/2023    3:31 PM 05/14/2023    4:39 PM 03/05/2023    4:13 PM  BP/Weight  Systolic BP 117 115 118  Diastolic BP 76 74 81  Wt. (Lbs) 172  164.4  BMI 34.74 kg/m2  33.2 kg/m2      Physical Exam Constitutional:      Appearance: She is well-developed.  Cardiovascular:     Rate and Rhythm: Normal rate.     Heart sounds: Normal heart sounds. No murmur heard. Pulmonary:     Effort: Pulmonary effort is normal.     Breath sounds: Normal breath sounds. No wheezing or rales.  Chest:     Chest wall: No tenderness.  Abdominal:     General: Bowel sounds are normal. There is no distension.     Palpations: Abdomen is soft. There is no mass.     Tenderness: There is no abdominal tenderness.  Musculoskeletal:        General: Normal range of motion.     Right lower leg: No edema.     Left lower leg: No edema.  Neurological:     Mental Status: She is alert and oriented to person, place, and time.  Psychiatric:        Mood and Affect: Mood normal.        Latest Ref Rng & Units 02/26/2023   12:04 PM 11/19/2022   10:17 AM 05/29/2022    1:52 PM  CMP  Glucose 70 - 99 mg/dL 81  98  82   BUN 6 - 20 mg/dL 10  10  9    Creatinine 0.44 - 1.00 mg/dL 1.61  0.96  0.45   Sodium 135 - 145 mmol/L 141  138  137   Potassium 3.5 - 5.1 mmol/L 3.9  4.3  3.5   Chloride 98 - 111 mmol/L 108  104  104   CO2 22 - 32 mmol/L 23  21  23    Calcium 8.9 - 10.3 mg/dL 9.3  9.7  9.1   Total Protein 6.0 - 8.5 g/dL  7.3  7.6   Total Bilirubin 0.0 - 1.2 mg/dL  0.4  0.4   Alkaline Phos 44 - 121 IU/L  163  112   AST 0 - 40 IU/L  37  30   ALT 0 - 32 IU/L  41  40     Lipid  Panel     Component Value Date/Time   CHOL 140 12/26/2022 1012   TRIG 76  12/26/2022 1012   HDL 40 12/26/2022 1012   CHOLHDL 4.1 06/15/2016 1630   VLDL 17 06/15/2016 1630   LDLCALC 85 12/26/2022 1012    CBC    Component Value Date/Time   WBC 10.8 (H) 02/26/2023 1204   RBC 4.76 02/26/2023 1204   HGB 11.4 (L) 02/26/2023 1204   HGB 10.8 (L) 11/19/2022 1017   HGB 11.2 07/27/2013 0000   HCT 36.7 02/26/2023 1204   HCT 34.9 11/19/2022 1017   HCT 34 07/27/2013 0000   PLT 411 (H) 02/26/2023 1204   PLT 329 11/19/2022 1017   PLT 273 05/18/2013 0000   MCV 77.1 (L) 02/26/2023 1204   MCV 74 (L) 11/19/2022 1017   MCH 23.9 (L) 02/26/2023 1204   MCHC 31.1 02/26/2023 1204   RDW 15.4 02/26/2023 1204   RDW 16.6 (H) 11/19/2022 1017   LYMPHSABS 1.8 11/19/2022 1017   MONOABS 0.6 05/29/2022 1352   EOSABS 0.2 11/19/2022 1017   BASOSABS 0.0 11/19/2022 1017    Lab Results  Component Value Date   HGBA1C 5.5 11/19/2022    Lab Results  Component Value Date   TSH <0.010 (L) 02/26/2023    Assessment & Plan:  Assessment and Plan Assessment & Plan Suspected hyperthyroidism Previous abnormal thyroid test in November. No recent follow-up blood test. No current symptoms of hyperthyroidism reported. -Perform blood test today to recheck thyroid levels. -If levels remain abnormal, consider initiation of medication for hyperthyroidism.  Menorrhagia Reports heavy menstrual periods, ongoing for a long time. Mild anemia noted in November. -Pelvic ultrasound from 11/2022 revealed uterine fibroid and left ovarian cyst, thickened endometrium but she was referred to GYN in 11/2022. -Provided her with a number to call GYN and get scheduled -Encourage consumption of green vegetables.   Follow-up Results of blood tests will be communicated via MyChart.      No orders of the defined types were placed in this encounter.   Follow-up: No follow-ups on file.       Hoy Register, MD, FAAFP. [facility] [place], [place] [phone]   06/05/2023, 3:49 PM

## 2023-06-05 NOTE — Patient Instructions (Signed)
 VISIT SUMMARY:  Ms [name] visited today for a follow-up on her recent sinus infection and palpitations, both of which have resolved. We also discussed  previous abnormal thyroid levels, heavy menstrual periods, and recent episodes of diarrhea.  YOUR PLAN:  -HYPERTHYROIDISM: Hyperthyroidism is a condition where the thyroid gland produces too much thyroid hormone. We will perform a blood test today to recheck your thyroid levels. If the levels are still abnormal, we may start you on medication to manage the condition.  -MENORRHAGIA: Menorrhagia is the medical term for heavy menstrual periods. You are encouraged to eat more green vegetables to increase your iron intake. We will also recheck your blood count to monitor for anemia.  INSTRUCTIONS:  We will communicate the results of your blood tests via MyChart.

## 2023-06-06 LAB — TSH: TSH: <0.005 u[IU]/mL — ABNORMAL LOW (ref 0.450–4.500)

## 2023-06-06 LAB — THYROID PEROXIDASE ANTIBODY: Thyroperoxidase Ab SerPl-aCnc: 231 [IU]/mL — ABNORMAL HIGH (ref 0–34)

## 2023-06-06 LAB — T3: T3, Total: 160 ng/dL (ref 71–180)

## 2023-06-06 LAB — T4, FREE: Free T4: 1.22 ng/dL (ref 0.82–1.77)

## 2023-06-06 LAB — THYROGLOBULIN ANTIBODY: Thyroglobulin Antibody: 1.3 [IU]/mL — ABNORMAL HIGH (ref 0.0–0.9)

## 2023-06-07 ENCOUNTER — Encounter: Payer: Self-pay | Admitting: Family Medicine

## 2023-06-07 ENCOUNTER — Other Ambulatory Visit: Payer: Self-pay | Admitting: Family Medicine

## 2023-06-07 DIAGNOSIS — E0590 Thyrotoxicosis, unspecified without thyrotoxic crisis or storm: Secondary | ICD-10-CM

## 2023-06-07 MED ORDER — METHIMAZOLE 5 MG PO TABS: 5.0000 mg | ORAL_TABLET | Freq: Three times a day (TID) | ORAL | 2 refills | Status: DC

## 2023-06-14 ENCOUNTER — Other Ambulatory Visit: Payer: Self-pay

## 2023-07-09 ENCOUNTER — Ambulatory Visit: Payer: Self-pay | Attending: Family Medicine

## 2023-07-09 DIAGNOSIS — E0590 Thyrotoxicosis, unspecified without thyrotoxic crisis or storm: Secondary | ICD-10-CM

## 2023-07-10 ENCOUNTER — Other Ambulatory Visit: Payer: Self-pay | Admitting: Family Medicine

## 2023-07-10 LAB — THYROTROPIN RECEPTOR AUTOABS: Thyrotropin Receptor Ab: <1.1 IU/L (ref 0.00–1.75)

## 2023-07-10 LAB — TSH: TSH: 0.079 u[IU]/mL — ABNORMAL LOW (ref 0.450–4.500)

## 2023-07-10 LAB — T4, FREE: Free T4: 0.75 ng/dL — ABNORMAL LOW (ref 0.82–1.77)

## 2023-07-10 LAB — T3: T3, Total: 138 ng/dL (ref 71–180)

## 2023-07-10 LAB — THYROID ANTIBODIES (THYROPEROXIDASE & THYROGLOBULIN)
Thyroglobulin Antibody: 1.4 [IU]/mL — ABNORMAL HIGH (ref 0.0–0.9)
Thyroperoxidase Ab SerPl-aCnc: 232 [IU]/mL — ABNORMAL HIGH (ref 0–34)

## 2023-07-11 ENCOUNTER — Other Ambulatory Visit: Payer: Self-pay | Admitting: Family Medicine

## 2023-07-11 ENCOUNTER — Encounter: Payer: Self-pay | Admitting: Family Medicine

## 2023-07-11 DIAGNOSIS — E0590 Thyrotoxicosis, unspecified without thyrotoxic crisis or storm: Secondary | ICD-10-CM

## 2023-07-11 MED ORDER — METHIMAZOLE 10 MG PO TABS: 10.0000 mg | ORAL_TABLET | Freq: Three times a day (TID) | ORAL | 3 refills | Status: AC

## 2023-07-30 ENCOUNTER — Ambulatory Visit
Admission: EM | Admit: 2023-07-30 | Discharge: 2023-07-30 | Disposition: A | Discharge status: Home / Self Care | Payer: Self-pay | Attending: Family Medicine | Admitting: Family Medicine

## 2023-07-30 DIAGNOSIS — H5789 Other specified disorders of eye and adnexa: Secondary | ICD-10-CM

## 2023-07-30 DIAGNOSIS — J309 Allergic rhinitis, unspecified: Secondary | ICD-10-CM

## 2023-07-30 MED ORDER — PROMETHAZINE-DM 6.25-15 MG/5ML PO SYRP: 5.0000 mL | ORAL_SOLUTION | Freq: Three times a day (TID) | ORAL | 0 refills | Status: DC | PRN

## 2023-07-30 MED ORDER — LEVOCETIRIZINE DIHYDROCHLORIDE 5 MG PO TABS: 5.0000 mg | ORAL_TABLET | Freq: Every evening | ORAL | 1 refills | Status: AC

## 2023-07-30 MED ORDER — OLOPATADINE HCL 0.1 % OP SOLN
1.0000 [drp] | Freq: Two times a day (BID) | OPHTHALMIC | 12 refills | Status: AC
Start: 2023-07-30 — End: ?

## 2023-07-30 NOTE — ED Triage Notes (Signed)
 Due to language barrier, an interpreter was present during the history-taking and subsequent discussion (and for part of the physical exam) with this patient. [name]. Number: [id number].   "I think I am having allergies, itchy eyes, throat and nose, with some runny nose, all of this makes it hard for me to breath during the night with stuffiness". No fever. No cough.

## 2023-07-30 NOTE — ED Provider Notes (Signed)
 [facility]    CSN: [id number] Arrival date & time: 07/30/23  1359      History   Chief Complaint Chief Complaint  Patient presents with   Allergies    Family of 2   HPI [name] is a 43 y.o. female.   HPI Patient here today with a few day history of worsening of her respiratory symptoms. Patient reports she has had nasal drainage and nasal congestion, eye itching, sneezing and runny nose.  Had some throat irritation from postnasal drainage.  Checked a fever a few days ago.  She has not been taking any medication for her symptoms.  Patient reports she has been having some increased work of breathing when laying down due to drainage and cough.  Here for evaluation of symptoms.  Past Medical History:  Diagnosis Date   Gestational diabetes     Patient Active Problem List   Diagnosis Date Noted   Palpitations 02/26/2023   Language barrier 11/11/2019    Past Surgical History:  Procedure Laterality Date   APPENDECTOMY     CHOLECYSTECTOMY  12/15/2014   Procedure: LAPAROSCOPIC CHOLECYSTECTOMY, attempted cholangiogram;  Surgeon: Dorena Gander, MD;  Location: [facility];  Service: General;;   LAPAROSCOPIC APPENDECTOMY  04/05/2012   Procedure: APPENDECTOMY LAPAROSCOPIC;  Surgeon: Evander Hills, DO;  Location: [facility];  Service: General;  Laterality: N/A;    OB History     Gravida  7   Para  6   Term  4   Preterm  2   AB  1   Living  5      SAB  1   IAB  0   Ectopic  0   Multiple  0   Live Births  5            Home Medications    Prior to Admission medications   Medication Sig Start Date End Date Taking? Authorizing Provider  levocetirizine (XYZAL ) 5 MG tablet Take 1 tablet (5 mg total) by mouth every evening. 07/30/23  Yes Buena Carmine, NP  methimazole  (TAPAZOLE ) 10 MG tablet Take 1 tablet (10 mg total) by mouth 3 (three) times daily. 07/11/23  Yes Newlin, Enobong, MD  olopatadine  (PATADAY ) 0.1 % ophthalmic solution Place 1 drop  into both eyes 2 (two) times daily. 07/30/23  Yes Buena Carmine, NP  promethazine -dextromethorphan (PROMETHAZINE -DM) 6.25-15 MG/5ML syrup Take 5 mLs by mouth 3 (three) times daily as needed for cough. 07/30/23  Yes Buena Carmine, NP    Family History Family History  Problem Relation Age of Onset   Stroke Neg Hx    Hypertension Neg Hx    Diabetes Neg Hx     Social History Social History   Tobacco Use   Smoking status: Never   Smokeless tobacco: Never  Vaping Use   Vaping status: Never Used  Substance Use Topics   Alcohol use: No   Drug use: No     Allergies   Patient has no known allergies.   Review of Systems Review of Systems Pertinent negatives listed in HPI   Physical Exam Triage Vital Signs ED Triage Vitals  Encounter Vitals Group     BP 07/30/23 1439 105/72     Systolic BP Percentile --      Diastolic BP Percentile --      Pulse Rate 07/30/23 1439 86     Resp 07/30/23 1439 18     Temp 07/30/23 1439 97.7 F (36.5 C)  Temp Source 07/30/23 1439 Oral     SpO2 07/30/23 1439 96 %     Weight 07/30/23 1435 177 lb 11.1 oz (80.6 kg)     Height 07/30/23 1435 4\' 11"  (1.499 m)     Head Circumference --      Peak Flow --      Pain Score 07/30/23 1434 0     Pain Loc --      Pain Education --      Exclude from Growth Chart --    No data found.  Updated Vital Signs BP 105/72 (BP Location: Left Arm)   Pulse 86   Temp 97.7 F (36.5 C) (Oral)   Resp 18   Ht 4\' 11"  (1.499 m)   Wt 177 lb 11.1 oz (80.6 kg)   LMP 07/15/2023 (Exact Date)   SpO2 96%   BMI 35.89 kg/m   Visual Acuity Right Eye Distance:   Left Eye Distance:   Bilateral Distance:    Right Eye Near:   Left Eye Near:    Bilateral Near:     Physical Exam Vitals reviewed.  Constitutional:      Appearance: Normal appearance.  HENT:     Head: Normocephalic and atraumatic.     Right Ear: Tympanic membrane, ear canal and external ear normal.     Left Ear: Tympanic membrane, ear canal  and external ear normal.     Nose: Congestion and rhinorrhea present.     Mouth/Throat:     Pharynx: Posterior oropharyngeal erythema present. No oropharyngeal exudate.  Eyes:     General:        Right eye: No discharge.        Left eye: No discharge.     Extraocular Movements: Extraocular movements intact.     Pupils: Pupils are equal, round, and reactive to light.  Cardiovascular:     Rate and Rhythm: Normal rate and regular rhythm.  Pulmonary:     Effort: Pulmonary effort is normal.     Breath sounds: Normal breath sounds.  Musculoskeletal:     Cervical back: Normal range of motion and neck supple.  Skin:    General: Skin is warm and dry.  Neurological:     General: No focal deficit present.     Mental Status: She is alert.  Psychiatric:        Mood and Affect: Mood normal.        Behavior: Behavior normal.      UC Treatments / Results  Labs (all labs ordered are listed, but only abnormal results are displayed) Labs Reviewed - No data to display  EKG   Radiology No results found.  Procedures Procedures (including critical care time)  Medications Ordered in UC Medications - No data to display  Initial Impression / Assessment and Plan / UC Course  I have reviewed the triage vital signs and the nursing notes.  Pertinent labs & imaging results that were available during my care of the patient were reviewed by me and considered in my medical decision making (see chart for details).  Patient is here today for evaluation of symptoms consistent with seasonal allergies.  Treatment for allergic rhinitis with Xyzal  5 mg daily at bedtime.  For eye irritation Pataday  1 drop in both eyes as needed.  Promethazine  DM as needed for cough and congestion.  Force fluids.  Return precautions given if symptoms worsen or do not improve. Final Clinical Impressions(s) / UC Diagnoses   Final diagnoses:  Allergic  rhinitis, unspecified seasonality, unspecified trigger  Irritation of  both eyes   Discharge Instructions   None    ED Prescriptions     Medication Sig Dispense Auth. Provider   levocetirizine (XYZAL ) 5 MG tablet Take 1 tablet (5 mg total) by mouth every evening. 30 tablet Buena Carmine, NP   olopatadine  (PATADAY ) 0.1 % ophthalmic solution Place 1 drop into both eyes 2 (two) times daily. 5 mL Buena Carmine, NP   promethazine -dextromethorphan (PROMETHAZINE -DM) 6.25-15 MG/5ML syrup Take 5 mLs by mouth 3 (three) times daily as needed for cough. 180 mL Buena Carmine, NP      PDMP not reviewed this encounter.   Buena Carmine, NP 07/30/23 [phone]

## 2023-09-04 ENCOUNTER — Ambulatory Visit (INDEPENDENT_AMBULATORY_CARE_PROVIDER_SITE_OTHER): Payer: Self-pay | Admitting: "Endocrinology

## 2023-09-04 ENCOUNTER — Encounter: Payer: Self-pay | Admitting: "Endocrinology

## 2023-09-04 ENCOUNTER — Ambulatory Visit: Payer: Self-pay | Attending: Family Medicine

## 2023-09-04 ENCOUNTER — Ambulatory Visit: Payer: Self-pay

## 2023-09-04 VITALS — BP 140/90 | HR 71 | Ht 59.0 in | Wt 178.0 lb

## 2023-09-04 DIAGNOSIS — E0590 Thyrotoxicosis, unspecified without thyrotoxic crisis or storm: Secondary | ICD-10-CM

## 2023-09-04 DIAGNOSIS — Z603 Acculturation difficulty: Secondary | ICD-10-CM

## 2023-09-04 DIAGNOSIS — Z758 Other problems related to medical facilities and other health care: Secondary | ICD-10-CM

## 2023-09-04 NOTE — Progress Notes (Signed)
 Outpatient Endocrinology Note Jorge Newcomer, MD  09/04/23   [name] 1980-09-27 [id number]  Referring Provider: Joaquin Mulberry, MD Primary Care Provider: Joaquin Mulberry, MD Subjective  No chief complaint on file.   Assessment & Plan  Diagnoses and all orders for this visit:  Hyperthyroidism -     TSH -     T3, free -     T4, free -     Thyroid  stimulating immunoglobulin  Language barrier    Sparkles Mcneely is currently taking methimazole  10 mg tid-self stopped on 08/30/23 after starting pain medication. Patient was biochemically hyperthyroid on last ch thyroid  eck.  Discussed the etiology for hyperthyroidism. Educated on thyroid  axis.  Recommend the following: Take methimazole  methimazole  10 mg tid-do labs a week after to adjust the dose as needed. Repeat labs in 3 months or sooner if symptoms of hyper or hypothyroidism develop.   Counseled on: -complications of untreated hyperthyroidism including atrial fibrillation, heart failure and osteoporosis -side effects of Methimazole  including but not limited to allergic reaction, rash, bone marrow suppression, liver dysfunction and teratogenic potential -compliance and follow up needs    If you notice any symptoms of worsening fatigue, fever with sore throat, loss of appetite, yellowing of eyes, dark urine, joint pains, sores in the mouth, itchy rash, light colored stools or abdominal pain, please stop the medication and call us  immediately as this can be a serious side effect of the medication.   I have reviewed current medications, nurse's notes, allergies, vital signs, past medical and surgical history, family medical history, and social history for this encounter. Counseled patient on symptoms, examination findings, lab findings, imaging results, treatment decisions and monitoring and prognosis. The patient understood the recommendations and agrees with the treatment plan. All questions regarding  treatment plan were fully answered.   Return in about 2 months (around 11/04/2023) for visit + labs in 1 week + labs before next visit.   Jorge Newcomer, MD  09/04/23   I have reviewed current medications, nurse's notes, allergies, vital signs, past medical and surgical history, family medical history, and social history for this encounter. Counseled patient on symptoms, examination findings, lab findings, imaging results, treatment decisions and monitoring and prognosis. The patient understood the recommendations and agrees with the treatment plan. All questions regarding treatment plan were fully answered.   History of Present Illness Sokha Tarisha Mayo is a 44 y.o. year old female who presents to our clinic with hyperthyroidism diagnosed in 2024.    History accomplished with the help of live language translator  Initial symptoms were fatigue, low energy, heat intolerance, palpitation  On methimazole  10 mg tid-self stopped on 08/30/23 after starting pain medication  Symptoms suggestive of HYPOTHYROIDISM:  fatigue No weight gain No cold intolerance  No constipation  No  Symptoms suggestive of HYPERTHYROIDISM:  weight loss  No heat intolerance No hyperdefecation  No palpitations  No  Compressive symptoms:  dysphagia  No dysphonia  No positional dyspnea (especially with simultaneous arms elevation)  No  Smokes  No On biotin  No Personal history of head/neck surgery/irradiation  No  Adverse Drug Effects from Methimazole  (MMI): rash No fever No throat pain No arthritis No c/o B/L wrist pain mouth ulcers No jaundice No loss of appetite No lymphadenopathy No  Grave's Ophthalmopathy Clinical Activity Score: 0/9  Physical Exam  BP (!) 140/90   Pulse 71   Ht 4\' 11"  (1.499 m)   Wt 178 lb (80.7 kg)  SpO2 98%   BMI 35.95 kg/m  Constitutional: well developed, well nourished Head: normocephalic, atraumatic, no exophthalmos Eyes: sclera anicteric, no redness Neck:  no thyromegaly, no thyroid  tenderness; no nodules palpated Lungs: normal respiratory effort Neurology: alert and oriented, no fine hand tremor Skin: dry, no appreciable rashes Musculoskeletal: no appreciable defects Psychiatric: normal mood and affect  Allergies No Known Allergies  Current Medications Patient's Medications  New Prescriptions   No medications on file  Previous Medications   LEVOCETIRIZINE (XYZAL ) 5 MG TABLET    Take 1 tablet (5 mg total) by mouth every evening.   METHIMAZOLE  (TAPAZOLE ) 10 MG TABLET    Take 1 tablet (10 mg total) by mouth 3 (three) times daily.   OLOPATADINE  (PATADAY ) 0.1 % OPHTHALMIC SOLUTION    Place 1 drop into both eyes 2 (two) times daily.   PROMETHAZINE -DEXTROMETHORPHAN (PROMETHAZINE -DM) 6.25-15 MG/5ML SYRUP    Take 5 mLs by mouth 3 (three) times daily as needed for cough.  Modified Medications   No medications on file  Discontinued Medications   No medications on file    Past Medical History Past Medical History:  Diagnosis Date   Gestational diabetes     Past Surgical History Past Surgical History:  Procedure Laterality Date   APPENDECTOMY     CHOLECYSTECTOMY  12/15/2014   Procedure: LAPAROSCOPIC CHOLECYSTECTOMY, attempted cholangiogram;  Surgeon: Dorena Gander, MD;  Location: [facility] OR;  Service: General;;   LAPAROSCOPIC APPENDECTOMY  04/05/2012   Procedure: APPENDECTOMY LAPAROSCOPIC;  Surgeon: Evander Hills, DO;  Location: [facility];  Service: General;  Laterality: N/A;    Family History family history is not on file.  Social History Social History   Socioeconomic History   Marital status: Significant Other    Spouse name: Not on file   Number of children: Not on file   Years of education: Not on file   Highest education level: Not on file  Occupational History   Not on file  Tobacco Use   Smoking status: Never   Smokeless tobacco: Never  Vaping Use   Vaping status: Never Used  Substance and Sexual Activity   Alcohol use:  No   Drug use: No   Sexual activity: Yes    Birth control/protection: None  Other Topics Concern   Not on file  Social History Narrative   Not on file   Social Drivers of Health   Financial Resource Strain: Low Risk  (03/05/2023)   Overall Financial Resource Strain (CARDIA)    Difficulty of Paying Living Expenses: Not hard at all  Food Insecurity: No Food Insecurity (03/05/2023)   Hunger Vital Sign    Worried About Running Out of Food in the Last Year: Never true    Ran Out of Food in the Last Year: Never true  Transportation Needs: No Transportation Needs (03/05/2023)   PRAPARE - Administrator, Civil Service (Medical): No    Lack of Transportation (Non-Medical): No  Physical Activity: Inactive (03/05/2023)   Exercise Vital Sign    Days of Exercise per Week: 0 days    Minutes of Exercise per Session: 0 min  Stress: No Stress Concern Present (03/05/2023)   [employer] of Occupational Health - Occupational Stress Questionnaire    Feeling of Stress : Not at all  Social Connections: Socially Integrated (03/05/2023)   Social Connection and Isolation Panel [NHANES]    Frequency of Communication with Friends and Family: More than three times a week    Frequency of Social Gatherings  with Friends and Family: Three times a week    Attends Religious Services: 1 to 4 times per year    Active Member of Clubs or Organizations: Yes    Attends Club or Organization Meetings: 1 to 4 times per year    Marital Status: Married  Catering manager Violence: Not At Risk (03/05/2023)   Humiliation, Afraid, Rape, and Kick questionnaire    Fear of Current or Ex-Partner: No    Emotionally Abused: No    Physically Abused: No    Sexually Abused: No    Laboratory Investigations Lab Results  Component Value Date   TSH 0.079 (L) 07/09/2023   TSH <0.005 (L) 06/05/2023   TSH <0.010 (L) 02/26/2023   FREET4 0.75 (L) 07/09/2023   FREET4 1.22 06/05/2023   FREET4 1.48 (Jaime) 02/26/2023      No results found for: "TSI"   No components found for: "TRAB"   Lab Results  Component Value Date   CHOL 140 12/26/2022   Lab Results  Component Value Date   HDL 40 12/26/2022   Lab Results  Component Value Date   LDLCALC 85 12/26/2022   Lab Results  Component Value Date   TRIG 76 12/26/2022   Lab Results  Component Value Date   CHOLHDL 4.1 06/15/2016   Lab Results  Component Value Date   CREATININE 0.47 02/26/2023   No results found for: "GFR"    Component Value Date/Time   NA 141 02/26/2023 1204   NA 138 11/19/2022 1017   K 3.9 02/26/2023 1204   CL 108 02/26/2023 1204   CO2 23 02/26/2023 1204   GLUCOSE 81 02/26/2023 1204   BUN 10 02/26/2023 1204   BUN 10 11/19/2022 1017   CREATININE 0.47 02/26/2023 1204   CREATININE 0.86 06/15/2016 1630   CALCIUM 9.3 02/26/2023 1204   PROT 7.3 11/19/2022 1017   ALBUMIN 4.0 11/19/2022 1017   AST 37 11/19/2022 1017   ALT 41 (Jaime) 11/19/2022 1017   ALKPHOS 163 (Jaime) 11/19/2022 1017   BILITOT 0.4 11/19/2022 1017   GFRNONAA >60 02/26/2023 1204   GFRNONAA 87 06/15/2016 1630   GFRAA >89 06/15/2016 1630      Latest Ref Rng & Units 02/26/2023   12:04 PM 11/19/2022   10:17 AM 05/29/2022    1:52 PM  BMP  Glucose 70 - 99 mg/dL 81  98  82   BUN 6 - 20 mg/dL 10  10  9    Creatinine 0.44 - 1.00 mg/dL 1.61  0.96  0.45   BUN/Creat Ratio 9 - 23  28    Sodium 135 - 145 mmol/L 141  138  137   Potassium 3.5 - 5.1 mmol/L 3.9  4.3  3.5   Chloride 98 - 111 mmol/L 108  104  104   CO2 22 - 32 mmol/L 23  21  23    Calcium 8.9 - 10.3 mg/dL 9.3  9.7  9.1        Component Value Date/Time   WBC 10.8 (Jaime) 02/26/2023 1204   RBC 4.76 02/26/2023 1204   HGB 11.4 (L) 02/26/2023 1204   HGB 10.8 (L) 11/19/2022 1017   HGB 11.2 07/27/2013 0000   HCT 36.7 02/26/2023 1204   HCT 34.9 11/19/2022 1017   HCT 34 07/27/2013 0000   PLT 411 (Jaime) 02/26/2023 1204   PLT 329 11/19/2022 1017   PLT 273 05/18/2013 0000   MCV 77.1 (L) 02/26/2023 1204   MCV 74 (L)  11/19/2022 1017   MCH  23.9 (L) 02/26/2023 1204   MCHC 31.1 02/26/2023 1204   RDW 15.4 02/26/2023 1204   RDW 16.6 (Jaime) 11/19/2022 1017   LYMPHSABS 1.8 11/19/2022 1017   MONOABS 0.6 05/29/2022 1352   EOSABS 0.2 11/19/2022 1017   BASOSABS 0.0 11/19/2022 1017      Parts of this note may have been dictated using voice recognition software. There may be variances in spelling and vocabulary which are unintentional. Not all errors are proofread. Please notify the [facility] if any discrepancies are noted or if the meaning of any statement is not clear.

## 2023-09-05 ENCOUNTER — Ambulatory Visit: Payer: Self-pay | Admitting: Physician Assistant

## 2023-09-05 ENCOUNTER — Other Ambulatory Visit: Payer: Self-pay | Admitting: "Endocrinology

## 2023-09-05 DIAGNOSIS — E0590 Thyrotoxicosis, unspecified without thyrotoxic crisis or storm: Secondary | ICD-10-CM

## 2023-09-05 LAB — T4, FREE: Free T4: 1.05 ng/dL (ref 0.82–1.77)

## 2023-09-05 LAB — T3: T3, Total: 164 ng/dL (ref 71–180)

## 2023-09-05 LAB — TSH: TSH: 1.26 u[IU]/mL (ref 0.450–4.500)

## 2023-09-11 ENCOUNTER — Other Ambulatory Visit: Payer: Self-pay

## 2023-09-12 ENCOUNTER — Ambulatory Visit: Payer: Self-pay | Admitting: "Endocrinology

## 2023-09-12 ENCOUNTER — Other Ambulatory Visit: Payer: Self-pay | Admitting: "Endocrinology

## 2023-09-12 DIAGNOSIS — E0590 Thyrotoxicosis, unspecified without thyrotoxic crisis or storm: Secondary | ICD-10-CM

## 2023-09-12 LAB — T3, FREE: T3, Free: 3.3 pg/mL (ref 2.3–4.2)

## 2023-09-12 LAB — T4, FREE: Free T4: 1.1 ng/dL (ref 0.8–1.8)

## 2023-09-12 LAB — TSH: TSH: 0.95 m[IU]/L

## 2023-09-22 ENCOUNTER — Ambulatory Visit (HOSPITAL_COMMUNITY)
Admission: EM | Admit: 2023-09-22 | Discharge: 2023-09-22 | Disposition: A | Discharge status: Home / Self Care | Payer: Self-pay

## 2023-09-22 DIAGNOSIS — G500 Trigeminal neuralgia: Secondary | ICD-10-CM

## 2023-09-22 MED ORDER — GABAPENTIN 300 MG PO CAPS
300.0000 mg | ORAL_CAPSULE | Freq: Every day | ORAL | 0 refills | Status: AC
Start: 2023-09-22 — End: ?

## 2023-09-22 NOTE — Discharge Instructions (Signed)
 I am concerned that you have a nerve that is irritated because of the surgery that is causing your pain.  Continue the antibiotic that was prescribed by your oral surgeon.  You can also continue ibuprofen .  I would stop the Tylenol  with codeine as we are going to try different medication that is specific to nerve pain.  They are both sedating and should not be combined.  You can take gabapentin at night.  This will make you sleepy so do not drive or drink alcohol taking it.  I would like you to follow-up with your surgeon tomorrow.  If anything worsens and you have swelling of your throat, shortness of breath, fever, change in your voice, worsening pain you should go to the emergency room.  Me preocupa que tenga un nervio irritado debido a la ciruga que le est causando dolor. Contine con el antibitico que le recet su cirujano oral. Tambin puede continuar con el ibuprofeno. Yo suspendera el Tylenol  con codena, ya que vamos a probar diferentes medicamentos especficos para Chief Technology Officer nervioso. Ambos son sedantes y no deben combinarse. Puede tomar gabapentina por la noche. Esto le dar sueo, as que no conduzca ni beba alcohol mientras la toma. Me gustara que tenga una cita de seguimiento con su cirujano maana. Si algo empeora y presenta inflamacin de garganta, dificultad para respirar, fiebre, cambios en la voz o empeoramiento del dolor, debe acudir a Oceanographer.

## 2023-09-22 NOTE — ED Provider Notes (Signed)
 [facility]    CSN: [id number] Arrival date & time: 09/22/23  1518      History   Chief Complaint No chief complaint on file.   HPI [name] is a 43 y.o. female.   Patient presents today accompanied by her son.  She is Spanish-speaking and video interpreter was utilized during visit.  She reports a 6-day history of severe shooting pain from her temple into her jaw.  She reports that on Monday (09/16/2023) she had her left lower wisdom tooth extracted.  She has had pain ever since the numbing wore off from this procedure.  She did go see her dentist on Thursday (09/19/2023) where she was prescribed Augmentin, Tylenol  3, ibuprofen  800 which she has been taking without improvement of symptoms.  She reports that the pain is primarily at her jaw and ear and then shoots throughout her jaw.  It is not centered around the tooth and she has not noticed any associated swelling.  She has never had episodes of similar symptoms in the past.  She denies any fever, nausea, vomiting, swelling of her throat, shortness of breath, muffled voice.  She is eating and drinking normally.  She has no concern for pregnancy.  She denies any visual disturbance, tenderness over her temple or TMJ.  Pain is rated 9 on a 0-10 pain scale, described as shooting, no alleviating factors identified.    Past Medical History:  Diagnosis Date   Gestational diabetes     Patient Active Problem List   Diagnosis Date Noted   Palpitations 02/26/2023   Language barrier 11/11/2019    Past Surgical History:  Procedure Laterality Date   APPENDECTOMY     CHOLECYSTECTOMY  12/15/2014   Procedure: LAPAROSCOPIC CHOLECYSTECTOMY, attempted cholangiogram;  Surgeon: Dorena Gander, MD;  Location: [facility] OR;  Service: General;;   LAPAROSCOPIC APPENDECTOMY  04/05/2012   Procedure: APPENDECTOMY LAPAROSCOPIC;  Surgeon: Evander Hills, DO;  Location: [facility];  Service: General;  Laterality: N/A;    OB History     Gravida  7    Para  6   Term  4   Preterm  2   AB  1   Living  5      SAB  1   IAB  0   Ectopic  0   Multiple  0   Live Births  5            Home Medications    Prior to Admission medications   Medication Sig Start Date End Date Taking? Authorizing Provider  acetaminophen -codeine (TYLENOL  #3) 300-30 MG tablet Take 1 tablet by mouth every 6 (six) hours as needed. 09/19/23  Yes [provider]  amoxicillin  (AMOXIL ) 500 MG tablet Take 500 mg by mouth every 8 (eight) hours. 09/12/23  Yes [provider]  gabapentin (NEURONTIN) 300 MG capsule Take 1 capsule (300 mg total) by mouth at bedtime. 09/22/23  Yes Villa Burgin K, PA-C  ibuprofen  (ADVIL ) 800 MG tablet Take 800 mg by mouth every 8 (eight) hours as needed. 09/19/23  Yes [provider]  levocetirizine (XYZAL ) 5 MG tablet Take 1 tablet (5 mg total) by mouth every evening. 07/30/23   Buena Carmine, NP  methimazole  (TAPAZOLE ) 10 MG tablet Take 1 tablet (10 mg total) by mouth 3 (three) times daily. 07/11/23   Newlin, Enobong, MD  olopatadine  (PATADAY ) 0.1 % ophthalmic solution Place 1 drop into both eyes 2 (two) times daily. 07/30/23   Buena Carmine, NP  Family History Family History  Problem Relation Age of Onset   Stroke Neg Hx    Hypertension Neg Hx    Diabetes Neg Hx     Social History Social History   Tobacco Use   Smoking status: Never   Smokeless tobacco: Never  Vaping Use   Vaping status: Never Used  Substance Use Topics   Alcohol use: No   Drug use: No     Allergies   Patient has no known allergies.   Review of Systems Review of Systems  Constitutional:  Positive for activity change. Negative for appetite change, fatigue and fever.  HENT:  Positive for dental problem. Negative for congestion, sore throat, trouble swallowing and voice change.   Respiratory:  Negative for shortness of breath.   Gastrointestinal:  Negative for abdominal pain, diarrhea, nausea and vomiting.   Skin:  Negative for color change and wound.  Neurological:  Negative for weakness and numbness.     Physical Exam Triage Vital Signs ED Triage Vitals  Encounter Vitals Group     BP 09/22/23 1546 125/81     Girls Systolic BP Percentile --      Girls Diastolic BP Percentile --      Boys Systolic BP Percentile --      Boys Diastolic BP Percentile --      Pulse Rate 09/22/23 1546 96     Resp 09/22/23 1546 18     Temp 09/22/23 1546 98.3 F (36.8 C)     Temp Source 09/22/23 1546 Oral     SpO2 09/22/23 1546 98 %     Weight --      Height --      Head Circumference --      Peak Flow --      Pain Score 09/22/23 1548 9     Pain Loc --      Pain Education --      Exclude from Growth Chart --    No data found.  Updated Vital Signs BP 125/81 (BP Location: Left Arm)   Pulse 96   Temp 98.3 F (36.8 C) (Oral)   Resp 18   SpO2 98%   Visual Acuity Right Eye Distance:   Left Eye Distance:   Bilateral Distance:    Right Eye Near:   Left Eye Near:    Bilateral Near:     Physical Exam Vitals reviewed.  Constitutional:      General: She is awake. She is not in acute distress.    Appearance: Normal appearance. She is well-developed. She is not ill-appearing.     Comments: Very pleasant female appears stated age in no acute distress sitting comfortably in exam room  HENT:     Head: Normocephalic and atraumatic.     Jaw: No tenderness, swelling or pain on movement.     Right Ear: Tympanic membrane, ear canal and external ear normal. Tympanic membrane is not erythematous or bulging.     Left Ear: Tympanic membrane, ear canal and external ear normal. Tympanic membrane is not erythematous or bulging.     Mouth/Throat:     Pharynx: Uvula midline. No oropharyngeal exudate or posterior oropharyngeal erythema.     Comments: Surgically absent left lower posterior molar without any evidence of gingival swelling or abscess.  No evidence of Ludwig angina.  Normal-appearing posterior  oropharynx.  Cardiovascular:     Rate and Rhythm: Normal rate and regular rhythm.     Heart sounds: Normal heart sounds, S1 normal  and S2 normal. No murmur heard. Pulmonary:     Effort: Pulmonary effort is normal.     Breath sounds: Normal breath sounds. No wheezing, rhonchi or rales.     Comments: Clear to auscultation bilaterally Abdominal:     General: Bowel sounds are normal.     Palpations: Abdomen is soft.     Tenderness: There is no abdominal tenderness. There is no right CVA tenderness, left CVA tenderness, guarding or rebound.   Psychiatric:        Behavior: Behavior is cooperative.      UC Treatments / Results  Labs (all labs ordered are listed, but only abnormal results are displayed) Labs Reviewed - No data to display  EKG   Radiology No results found.  Procedures Procedures (including critical care time)  Medications Ordered in UC Medications - No data to display  Initial Impression / Assessment and Plan / UC Course  I have reviewed the triage vital signs and the nursing notes.  Pertinent labs & imaging results that were available during my care of the patient were reviewed by me and considered in my medical decision making (see chart for details).     Patient is well-appearing, afebrile, nontoxic, nontachycardic.  No evidence of abscess on exam.  We discussed that it is reasonable for her to continue her previously prescribed medication including Augmentin and ibuprofen  in case there is an underlying infection that is being treated.  Low suspicion for dry socket as the pain is not centered around the surgical site.  Low suspicion for temporal arteritis as she has no visual change or tenderness over the temple.  I am concerned that she might have trigeminal neuralgia given the distribution of pain and description.  She was given gabapentin to help manage the pain with instructions to take this at night as it can be sedating.  This is not to be combined with  the Tylenol  3 due to concern for oversedation.  Recommend close follow-up with her oral surgeon for further evaluation and management; she will call to schedule an appointment tomorrow.  We discussed that if anything worsens or changes and she is swelling of her throat, shortness of breath, muffled voice, fever, worsening pain she needs to be seen immediately.  Strict return precautions given.  Excuse note provided.  Final Clinical Impressions(s) / UC Diagnoses   Final diagnoses:  Trigeminal neuralgia     Discharge Instructions      I am concerned that you have a nerve that is irritated because of the surgery that is causing your pain.  Continue the antibiotic that was prescribed by your oral surgeon.  You can also continue ibuprofen .  I would stop the Tylenol  with codeine as we are going to try different medication that is specific to nerve pain.  They are both sedating and should not be combined.  You can take gabapentin at night.  This will make you sleepy so do not drive or drink alcohol taking it.  I would like you to follow-up with your surgeon tomorrow.  If anything worsens and you have swelling of your throat, shortness of breath, fever, change in your voice, worsening pain you should go to the emergency room.  Me preocupa que tenga un nervio irritado debido a la ciruga que le est causando dolor. Contine con el antibitico que le recet su cirujano oral. Tambin puede continuar con el ibuprofeno. Yo suspendera el Tylenol  con codena, ya que vamos a probar diferentes medicamentos especficos para el  dolor nervioso. Ambos son sedantes y no deben combinarse. Puede tomar gabapentina por la noche. Esto le dar sueo, as que no conduzca ni beba alcohol mientras la toma. Me gustara que tenga una cita de seguimiento con su cirujano maana. Si algo empeora y presenta inflamacin de garganta, dificultad para respirar, fiebre, cambios en la voz o empeoramiento del dolor, debe acudir a  Oceanographer.     ED Prescriptions     Medication Sig Dispense Auth. Provider   gabapentin (NEURONTIN) 300 MG capsule Take 1 capsule (300 mg total) by mouth at bedtime. 7 capsule Dea Bitting K, PA-C      PDMP not reviewed this encounter.   Budd Cargo, PA-C 09/22/23 1620

## 2023-09-22 NOTE — ED Triage Notes (Signed)
 Patient had wisdom teeth extracted and is still having pain. Patient states she went back to the dentist for infection. Patient has been taking Amox,Hydrocodone  and Motrin  with no relief.

## 2023-10-31 ENCOUNTER — Other Ambulatory Visit: Payer: Self-pay

## 2023-11-04 ENCOUNTER — Ambulatory Visit: Payer: Self-pay | Admitting: "Endocrinology

## 2023-12-03 ENCOUNTER — Ambulatory Visit: Payer: Self-pay | Admitting: Family Medicine

## 2023-12-10 ENCOUNTER — Other Ambulatory Visit: Payer: Self-pay

## 2023-12-16 ENCOUNTER — Ambulatory Visit: Payer: Self-pay | Admitting: "Endocrinology

## 2024-02-26 ENCOUNTER — Ambulatory Visit: Payer: Self-pay | Admitting: Family Medicine

## 2024-04-28 ENCOUNTER — Encounter: Payer: Self-pay | Admitting: Family Medicine

## 2024-04-28 ENCOUNTER — Ambulatory Visit: Payer: Self-pay | Attending: Family Medicine | Admitting: Family Medicine

## 2024-04-28 VITALS — BP 125/82 | HR 80 | Temp 98.1°F | Ht 59.0 in | Wt 181.0 lb

## 2024-04-28 DIAGNOSIS — E0590 Thyrotoxicosis, unspecified without thyrotoxic crisis or storm: Secondary | ICD-10-CM

## 2024-04-28 DIAGNOSIS — Z23 Encounter for immunization: Secondary | ICD-10-CM

## 2024-04-28 DIAGNOSIS — G5603 Carpal tunnel syndrome, bilateral upper limbs: Secondary | ICD-10-CM

## 2024-04-28 DIAGNOSIS — Z1231 Encounter for screening mammogram for malignant neoplasm of breast: Secondary | ICD-10-CM

## 2024-04-28 DIAGNOSIS — Z131 Encounter for screening for diabetes mellitus: Secondary | ICD-10-CM

## 2024-04-28 DIAGNOSIS — Z13228 Encounter for screening for other metabolic disorders: Secondary | ICD-10-CM

## 2024-04-28 MED ORDER — MELOXICAM 7.5 MG PO TABS: 7.5000 mg | ORAL_TABLET | Freq: Every day | ORAL | 1 refills | Status: AC

## 2024-04-28 NOTE — Patient Instructions (Signed)
 Pinzamiento del nervio de la mueca (sndrome del tnel carpiano): Qu debe saber Pinched Nerve in the Wrist (Carpal Tunnel Syndrome): What to Know  El pinzamiento del nervio de la [place] (sndrome del tnel carpiano o STC) es un problema nervioso que Passenger transport manager, entumecimiento y debilidad en la [place], la mano y los dedos. El tnel carpiano es un espacio estrecho que se encuentra en el lado palmar de la [place]. Los movimientos repetitivos de la mueca o determinadas enfermedades pueden causar hinchazn en el tnel. Esta hinchazn puede comprimir el nervio principal de la mueca (el nervio mediano). Cules son las causas? Las causas del STC pueden ser las siguientes: Mover la mano y la [place] y otra vez mientras realiza una tarea. Lastimarse la [place]. Artritis. Una bolsa llena de lquido (quiste) o un crecimiento (tumor) en el tnel carpiano. Acumulacin de lquido Academic librarian. Uso de herramientas que vibran. En algunos casos, se desconoce la causa del STC. Qu incrementa el riesgo? Es ms probable que tenga el STC si: Tiene un trabajo en el que deba hacer estas cosas: Mover la mano con firmeza una y [place]. Trabajar con herramientas que vibran, como taladros o lijadoras. Es mujer. Tiene diabetes, obesidad, problemas de tiroides o insuficiencia renal. Cules son los signos o sntomas? Los sntomas de esta afeccin incluyen: Sensacin de hormigueo en los dedos. Puede sentir [employer], el dedo ndice o el dedo corazn. Hormigueo o prdida de la sensibilidad de Engineer, site. Dolor en todo el brazo. Este dolor puede empeorar al flexionar la [place] y el codo durante [place]. Dolor en la mueca que sube por el brazo hasta el hombro. Dolor que baja hasta la palma de la mano o los dedos. Debilidad en las manos. Puede resultarle difcil tomar y Personal assistant. Los sntomas pueden empeorar durante la noche. Cmo se diagnostica? El STC se diagnostica mediante  la historia clnica y un examen fsico. Tambin pueden hacerle pruebas y estudios de diagnstico por imgenes para: Revisar las Catering manager que los nervios les envan a los msculos. Determinar si las seales elctricas pasan correctamente por los nervios. Determinar las posibles causas del STC. Estos incluyen radiografas, ecografa y Health visitor (RM). Cmo se trata? El STC puede tratarse de las siguientes maneras: Cambios en el estilo de vida. Se le pedir que deje o cambie la actividad que caus el problema. Fisioterapia. Puede incluir: Ejercicios que estiran y [employer] y tendones de la mueca y la [place]. Ejercicios de deslizamiento o movilizacin de los nervios. Estos ayudan a que los nervios se muevan fluidamente dentro de los tejidos [employer] rodean. Terapia ocupacional. Aprender a usar la mano nuevamente. Medicamentos para Chief Technology Officer y la hinchazn. Es posible que le apliquen inyecciones en la [place]. Una frula o un dispositivo ortopdico para la [place]. [place]. Siga estas indicaciones en su casa: Si tiene una frula o un dispositivo ortopdico: Use la frula o el dispositivo ortopdico como se lo hayan indicado. Quteselos solo si el mdico lo autoriza. Controle [employer] piel a su alrededor. Informe al mdico si observa problemas. Afloje la frula o el dispositivo ortopdico si los dedos se le entumecen, siente hormigueo o se le enfran y se tornan de Research officer, trade union. Mantenga la frula o el dispositivo ortopdico limpios y secos. Si la frula o el dispositivo ortopdico no son impermeables: No deje que se mojen. Cbralos para ducharse o baarse. Use una cubierta que no permita que [place]. Control  del dolor, la rigidez y la hinchazn  Use hielo o una bolsa de hielo como se lo hayan indicado. Si tiene una frula o un dispositivo ortopdico que se pueden Development worker, community, quteselos como se lo hayan indicado. Coloque una toalla entre la piel y el  hielo. Coloque el hielo durante 20 minutos, 2 a 3 veces por da. Si la piel se le pone de color rojo, quite el hielo de inmediato para evitar daos en la piel. El [place] de dao es mayor si no puede sentir dolor, Airline pilot o fro. Mueva los dedos de la mano con frecuencia para reducir la rigidez y la hinchazn. Indicaciones generales Use los medicamentos nicamente segn las indicaciones. Descanse la [place] y la mano de cualquier actividad que le cause dolor. Si la causa del STC es algo que hace en el [place], hable con su empleador sobre hacer cambios. Por ejemplo, es posible que necesite usar una almohadilla para la mueca mientras escribe en el teclado. Haga ejercicio segn las indicaciones. Siga las instrucciones para hacer los ejercicios de deslizamiento o movilizacin de los nervios. Estos ayudan a que los nervios se muevan fluidamente dentro de los tejidos [employer] rodean. Concurra a todas las visitas de seguimiento. Esto es importante. Dnde obtener ms informacin [employer] Orthopedic Surgeons (Academia Estadounidense de Cirujanos Ortopdicos): orthoinfo.aaos.[employer] of Neurological Disorders and Stroke (Instituto Nacional de Trastornos Neurolgicos y Accidentes Cerebrovasculares): BasicFM.no Comunquese con un mdico si: Aparecen nuevos sntomas. El dolor no se alivia con los [employer]. Los sntomas empeoran. Solicite ayuda de inmediato si: La mano o la mueca se le adormecen o siente hormigueos, y los sntomas se hacen muy intensos. Esta informacin no tiene Theme park manager el consejo del mdico. Asegrese de hacerle al mdico cualquier pregunta que tenga. Document Revised: 12/11/2022 Document Reviewed: 12/11/2022 Elsevier Patient Education  2024 [employer].

## 2024-04-28 NOTE — Progress Notes (Signed)
 "  Subjective:  Patient ID: [name], female    DOB: 1980/10/13  Age: 44 y.o. MRN: [id number]  CC: Medical Management of Chronic Issues (No question )     Discussed the use of AI scribe software for clinical note transcription with the patient, who gave verbal consent to proceed.  History of Present Illness [name] is a 44 year old female with a history of subclinical Hyperthyroidism who presents with bilateral hand and forearm pain and swelling.  She reports 3-4 months of sharp pain and swelling in both hands and forearms, mainly at the wrists. Pain sometimes wakes her at night and improves when she shakes her hands or immerses them in cold water. She denies numbness or tingling in the hands.   She was placed on Methimazole  for hyperthyroidism which she has not been taking. Last TSH was normal however she previously had a couple of suppressed TSH readings with +ve thyroglobulin and peroxidase ab. Thyroid  Ultrasound ordered which she never completed    Past Medical History:  Diagnosis Date   Gestational diabetes     Past Surgical History:  Procedure Laterality Date   APPENDECTOMY     CHOLECYSTECTOMY  12/15/2014   Procedure: LAPAROSCOPIC CHOLECYSTECTOMY, attempted cholangiogram;  Surgeon: Dann Hummer, MD;  Location: [facility] OR;  Service: General;;   LAPAROSCOPIC APPENDECTOMY  04/05/2012   Procedure: APPENDECTOMY LAPAROSCOPIC;  Surgeon: Redell Faith, DO;  Location: [facility];  Service: General;  Laterality: N/A;    Family History  Problem Relation Age of Onset   Stroke Neg Hx    Hypertension Neg Hx    Diabetes Neg Hx     Social History   Socioeconomic History   Marital status: Significant Other    Spouse name: Not on file   Number of children: Not on file   Years of education: Not on file   Highest education level: Not on file  Occupational History   Not on file  Tobacco Use   Smoking status: Never   Smokeless tobacco: Never  Vaping Use   Vaping  status: Never Used  Substance and Sexual Activity   Alcohol use: No   Drug use: No   Sexual activity: Yes    Birth control/protection: None  Other Topics Concern   Not on file  Social History Narrative   Not on file   Social Drivers of Health   Tobacco Use: Low Risk (09/04/2023)   Patient History    Smoking Tobacco Use: Never    Smokeless Tobacco Use: Never    Passive Exposure: Not on file  Financial Resource Strain: Low Risk (03/05/2023)   Overall Financial Resource Strain (CARDIA)    Difficulty of Paying Living Expenses: Not hard at all  Food Insecurity: No Food Insecurity (03/05/2023)   Hunger Vital Sign    Worried About Running Out of Food in the Last Year: Never true    Ran Out of Food in the Last Year: Never true  Transportation Needs: No Transportation Needs (03/05/2023)   PRAPARE - Administrator, Civil Service (Medical): No    Lack of Transportation (Non-Medical): No  Physical Activity: Inactive (03/05/2023)   Exercise Vital Sign    Days of Exercise per Week: 0 days    Minutes of Exercise per Session: 0 min  Stress: No Stress Concern Present (03/05/2023)   [employer] of Occupational Health - Occupational Stress Questionnaire    Feeling of Stress : Not at all  Social Connections: Socially Integrated (03/05/2023)  Social Connection and Isolation Panel    Frequency of Communication with Friends and Family: More than three times a week    Frequency of Social Gatherings with Friends and Family: Three times a week    Attends Religious Services: 1 to 4 times per year    Active Member of Clubs or Organizations: Yes    Attends Banker Meetings: 1 to 4 times per year    Marital Status: Married  Depression (PHQ2-9): Low Risk (04/28/2024)   Depression (PHQ2-9)    PHQ-2 Score: 0  Alcohol Screen: Low Risk (03/05/2023)   Alcohol Screen    Last Alcohol Screening Score (AUDIT): 0  Housing: Low Risk (03/05/2023)   Housing    Last Housing Risk  Score: 0  Utilities: Not At Risk (03/05/2023)   AHC Utilities    Threatened with loss of utilities: No  Health Literacy: Adequate Health Literacy (03/05/2023)   B1300 Health Literacy    Frequency of need for help with medical instructions: Never    Allergies[1]  Outpatient Medications Prior to Visit  Medication Sig Dispense Refill   acetaminophen -codeine (TYLENOL  #3) 300-30 MG tablet Take 1 tablet by mouth every 6 (six) hours as needed. (Patient not taking: Reported on 04/28/2024)     amoxicillin  (AMOXIL ) 500 MG tablet Take 500 mg by mouth every 8 (eight) hours. (Patient not taking: Reported on 04/28/2024)     gabapentin  (NEURONTIN ) 300 MG capsule Take 1 capsule (300 mg total) by mouth at bedtime. (Patient not taking: Reported on 04/28/2024) 7 capsule 0   ibuprofen  (ADVIL ) 800 MG tablet Take 800 mg by mouth every 8 (eight) hours as needed. (Patient not taking: Reported on 04/28/2024)     levocetirizine (XYZAL ) 5 MG tablet Take 1 tablet (5 mg total) by mouth every evening. (Patient not taking: Reported on 04/28/2024) 30 tablet 1   methimazole  (TAPAZOLE ) 10 MG tablet Take 1 tablet (10 mg total) by mouth 3 (three) times daily. (Patient not taking: Reported on 04/28/2024) 90 tablet 3   olopatadine  (PATADAY ) 0.1 % ophthalmic solution Place 1 drop into both eyes 2 (two) times daily. (Patient not taking: Reported on 04/28/2024) 5 mL 12   No facility-administered medications prior to visit.     ROS Review of Systems  Constitutional:  Negative for activity change and appetite change.  HENT:  Negative for sinus pressure and sore throat.   Respiratory:  Negative for chest tightness, shortness of breath and wheezing.   Cardiovascular:  Negative for chest pain and palpitations.  Gastrointestinal:  Negative for abdominal distention, abdominal pain and constipation.  Genitourinary: Negative.   Musculoskeletal:        See HPI  Psychiatric/Behavioral:  Negative for behavioral problems and dysphoric mood.      Objective:  BP 125/82   Pulse 80   Temp 98.1 F (36.7 C) (Oral)   Ht 4' 11 (1.499 m)   Wt 181 lb (82.1 kg)   SpO2 97%   BMI 36.56 kg/m      04/28/2024    1:48 PM 09/22/2023    3:46 PM 09/04/2023    8:21 AM  BP/Weight  Systolic BP 125 125 140  Diastolic BP 82 81 90  Wt. (Lbs) 181  178  BMI 36.56 kg/m2  35.95 kg/m2      Physical Exam Constitutional:      Appearance: She is well-developed.  Cardiovascular:     Rate and Rhythm: Normal rate.     Heart sounds: Normal heart sounds. No murmur  heard. Pulmonary:     Effort: Pulmonary effort is normal.     Breath sounds: Normal breath sounds. No wheezing or rales.  Chest:     Chest wall: No tenderness.  Abdominal:     General: Bowel sounds are normal. There is no distension.     Palpations: Abdomen is soft. There is no mass.     Tenderness: There is no abdominal tenderness.  Musculoskeletal:        General: Normal range of motion.     Right lower leg: No edema.     Left lower leg: No edema.  Neurological:     Mental Status: She is alert and oriented to person, place, and time.  Psychiatric:        Mood and Affect: Mood normal.        Latest Ref Rng & Units 02/26/2023   12:04 PM 11/19/2022   10:17 AM 05/29/2022    1:52 PM  CMP  Glucose 70 - 99 mg/dL 81  98  82   BUN 6 - 20 mg/dL 10  10  9    Creatinine 0.44 - 1.00 mg/dL 9.52  9.63  9.55   Sodium 135 - 145 mmol/L 141  138  137   Potassium 3.5 - 5.1 mmol/L 3.9  4.3  3.5   Chloride 98 - 111 mmol/L 108  104  104   CO2 22 - 32 mmol/L 23  21  23    Calcium 8.9 - 10.3 mg/dL 9.3  9.7  9.1   Total Protein 6.0 - 8.5 g/dL  7.3  7.6   Total Bilirubin 0.0 - 1.2 mg/dL  0.4  0.4   Alkaline Phos 44 - 121 IU/L  163  112   AST 0 - 40 IU/L  37  30   ALT 0 - 32 IU/L  41  40     Lipid Panel     Component Value Date/Time   CHOL 140 12/26/2022 1012   TRIG 76 12/26/2022 1012   HDL 40 12/26/2022 1012   CHOLHDL 4.1 06/15/2016 1630   VLDL 17 06/15/2016 1630   LDLCALC 85  12/26/2022 1012    CBC    Component Value Date/Time   WBC 10.8 (H) 02/26/2023 1204   RBC 4.76 02/26/2023 1204   HGB 11.4 (L) 02/26/2023 1204   HGB 10.8 (L) 11/19/2022 1017   HGB 11.2 07/27/2013 0000   HCT 36.7 02/26/2023 1204   HCT 34.9 11/19/2022 1017   HCT 34 07/27/2013 0000   PLT 411 (H) 02/26/2023 1204   PLT 329 11/19/2022 1017   PLT 273 05/18/2013 0000   MCV 77.1 (L) 02/26/2023 1204   MCV 74 (L) 11/19/2022 1017   MCH 23.9 (L) 02/26/2023 1204   MCHC 31.1 02/26/2023 1204   RDW 15.4 02/26/2023 1204   RDW 16.6 (H) 11/19/2022 1017   LYMPHSABS 1.8 11/19/2022 1017   MONOABS 0.6 05/29/2022 1352   EOSABS 0.2 11/19/2022 1017   BASOSABS 0.0 11/19/2022 1017    Lab Results  Component Value Date   HGBA1C 5.5 11/19/2022       Assessment & Plan Carpal tunnel syndrome Bilateral wrist pain for 3-4 months, likely carpal tunnel syndrome due to nerve compression. - Prescribed meloxicam  once daily. - Provided carpal tunnel syndrome information in Spanish. - Recommended wrist brace, especially during sleep.  Hyperthyroidism Subclinical Previously diagnosed, currently untreated as she self discontinue tapazole . Requires thyroid  function reassessment. -She never completed thyroid  Ultrasound previously ordered -Picture suspicious for Hashimoto's -  Ordered thyroid  function tests. -If still abnormal will refer to Endocrine  General health maintenance Due for routine screenings and vaccinations. Screening for metabolic disorder/ Diabetes - Ordered blood tests for kidney, liver function, cholesterol, and diabetes. - Scheduled fasting blood tests for Thursday. Encounter for screening for breast cancer - Ordered mammogram.     Healthcare maintenance Encounter for vaccine administration - Flu shot admnistered  Meds ordered this encounter  Medications   meloxicam  (MOBIC ) 7.5 MG tablet    Sig: Take 1 tablet (7.5 mg total) by mouth daily.    Dispense:  30 tablet    Refill:  1     Follow-up: Return in about 1 year (around 04/28/2025) for CPE/ Preventive Health Exam.       Corrina Sabin, MD, FAAFP. [facility] [place], [place] [phone]   04/28/2024, 3:37 PM     [1] No Known Allergies  "

## 2024-04-30 ENCOUNTER — Ambulatory Visit: Payer: Self-pay | Attending: Family Medicine

## 2024-04-30 DIAGNOSIS — Z13228 Encounter for screening for other metabolic disorders: Secondary | ICD-10-CM

## 2024-04-30 DIAGNOSIS — Z131 Encounter for screening for diabetes mellitus: Secondary | ICD-10-CM

## 2024-04-30 DIAGNOSIS — E0590 Thyrotoxicosis, unspecified without thyrotoxic crisis or storm: Secondary | ICD-10-CM

## 2024-04-30 DIAGNOSIS — G5603 Carpal tunnel syndrome, bilateral upper limbs: Secondary | ICD-10-CM

## 2024-05-01 LAB — CBC WITH DIFFERENTIAL/PLATELET
Basophils Absolute: 0 x10E3/uL (ref 0.0–0.2)
Basos: 0 %
EOS (ABSOLUTE): 0.2 x10E3/uL (ref 0.0–0.4)
Eos: 2 %
Hematocrit: 34.3 % (ref 34.0–46.6)
Hemoglobin: 11.2 g/dL (ref 11.1–15.9)
Immature Grans (Abs): 0 x10E3/uL (ref 0.0–0.1)
Immature Granulocytes: 0 %
Lymphocytes Absolute: 2.1 x10E3/uL (ref 0.7–3.1)
Lymphs: 25 %
MCH: 26.2 pg — ABNORMAL LOW (ref 26.6–33.0)
MCHC: 32.7 g/dL (ref 31.5–35.7)
MCV: 80 fL (ref 79–97)
Monocytes Absolute: 0.6 x10E3/uL (ref 0.1–0.9)
Monocytes: 7 %
Neutrophils Absolute: 5.6 x10E3/uL (ref 1.4–7.0)
Neutrophils: 66 %
Platelets: 343 x10E3/uL (ref 150–450)
RBC: 4.27 x10E6/uL (ref 3.77–5.28)
RDW: 15 % (ref 11.7–15.4)
WBC: 8.5 x10E3/uL (ref 3.4–10.8)

## 2024-05-01 LAB — LP+NON-HDL CHOLESTEROL
Cholesterol, Total: 152 mg/dL (ref 100–199)
HDL: 40 mg/dL
LDL Chol Calc (NIH): 91 mg/dL (ref 0–99)
Total Non-HDL-Chol (LDL+VLDL): 112 mg/dL (ref 0–129)
Triglycerides: 113 mg/dL (ref 0–149)
VLDL Cholesterol Cal: 21 mg/dL (ref 5–40)

## 2024-05-01 LAB — CMP14+EGFR
ALT: 37 IU/L — ABNORMAL HIGH (ref 0–32)
AST: 26 IU/L (ref 0–40)
Albumin: 4.1 g/dL (ref 3.9–4.9)
Alkaline Phosphatase: 139 IU/L — ABNORMAL HIGH (ref 41–116)
BUN/Creatinine Ratio: 27 — ABNORMAL HIGH (ref 9–23)
BUN: 13 mg/dL (ref 6–24)
Bilirubin Total: 0.3 mg/dL (ref 0.0–1.2)
CO2: 21 mmol/L (ref 20–29)
Calcium: 9.1 mg/dL (ref 8.7–10.2)
Chloride: 104 mmol/L (ref 96–106)
Creatinine, Ser: 0.48 mg/dL — ABNORMAL LOW (ref 0.57–1.00)
Globulin, Total: 3.2 g/dL (ref 1.5–4.5)
Glucose: 87 mg/dL (ref 70–99)
Potassium: 4.2 mmol/L (ref 3.5–5.2)
Sodium: 138 mmol/L (ref 134–144)
Total Protein: 7.3 g/dL (ref 6.0–8.5)
eGFR: 120 mL/min/1.73

## 2024-05-01 LAB — T3: T3, Total: 175 ng/dL (ref 71–180)

## 2024-05-01 LAB — THYROTROPIN RECEPTOR AUTOABS: Thyrotropin Receptor Ab: 1.22 IU/L (ref 0.00–1.75)

## 2024-05-01 LAB — VITAMIN B12: Vitamin B-12: >2000 pg/mL — ABNORMAL HIGH (ref 232–1245)

## 2024-05-01 LAB — HEMOGLOBIN A1C
Est. average glucose Bld gHb Est-mCnc: 114 mg/dL
Hgb A1c MFr Bld: 5.6 % (ref 4.8–5.6)

## 2024-05-01 LAB — TSH: TSH: 1.84 u[IU]/mL (ref 0.450–4.500)

## 2024-05-01 LAB — THYROID ANTIBODIES (THYROPEROXIDASE & THYROGLOBULIN)
Thyroglobulin Antibody: 4.4 [IU]/mL — ABNORMAL HIGH (ref 0.0–0.9)
Thyroperoxidase Ab SerPl-aCnc: 283 [IU]/mL — ABNORMAL HIGH (ref 0–34)

## 2024-05-01 LAB — T4, FREE: Free T4: 1.07 ng/dL (ref 0.82–1.77)

## 2024-05-04 ENCOUNTER — Ambulatory Visit: Payer: Self-pay | Admitting: Family Medicine

## 2024-05-04 DIAGNOSIS — R7989 Other specified abnormal findings of blood chemistry: Secondary | ICD-10-CM

## 2025-04-28 ENCOUNTER — Ambulatory Visit: Payer: Self-pay | Admitting: Family Medicine
